# Patient Record
Sex: Male | Born: 1960 | Race: White | Hispanic: No | Marital: Married | Smoking: Former smoker
Health system: Southern US, Community
[De-identification: ages and names within clinical notes are randomized; demographics above are authoritative.]

## PROBLEM LIST (undated history)

## (undated) DIAGNOSIS — M1610 Unilateral primary osteoarthritis, unspecified hip: Secondary | ICD-10-CM

## (undated) DIAGNOSIS — R569 Unspecified convulsions: Secondary | ICD-10-CM

## (undated) DIAGNOSIS — F329 Major depressive disorder, single episode, unspecified: Secondary | ICD-10-CM

## (undated) DIAGNOSIS — I10 Essential (primary) hypertension: Secondary | ICD-10-CM

## (undated) DIAGNOSIS — F419 Anxiety disorder, unspecified: Secondary | ICD-10-CM

## (undated) DIAGNOSIS — F32A Depression, unspecified: Secondary | ICD-10-CM

## (undated) DIAGNOSIS — Z7141 Alcohol abuse counseling and surveillance of alcoholic: Secondary | ICD-10-CM

## (undated) DIAGNOSIS — M1990 Unspecified osteoarthritis, unspecified site: Secondary | ICD-10-CM

## (undated) DIAGNOSIS — M2550 Pain in unspecified joint: Secondary | ICD-10-CM

## (undated) DIAGNOSIS — K649 Unspecified hemorrhoids: Secondary | ICD-10-CM

## (undated) DIAGNOSIS — E785 Hyperlipidemia, unspecified: Secondary | ICD-10-CM

## (undated) DIAGNOSIS — K219 Gastro-esophageal reflux disease without esophagitis: Secondary | ICD-10-CM

## (undated) HISTORY — DX: Essential (primary) hypertension: I10

## (undated) HISTORY — DX: Depression, unspecified: F32.A

## (undated) HISTORY — DX: Unspecified osteoarthritis, unspecified site: M19.90

## (undated) HISTORY — PX: TONSILLECTOMY: SUR1361

## (undated) HISTORY — DX: Pain in unspecified joint: M25.50

## (undated) HISTORY — DX: Anxiety disorder, unspecified: F41.9

## (undated) HISTORY — DX: Alcohol abuse counseling and surveillance of alcoholic: Z71.41

## (undated) HISTORY — DX: Unilateral primary osteoarthritis, unspecified hip: M16.10

## (undated) HISTORY — DX: Hyperlipidemia, unspecified: E78.5

## (undated) HISTORY — PX: ARTHROSCOPIC REPAIR ACL: SUR80

## (undated) HISTORY — PX: FRACTURE SURGERY: SHX138

## (undated) HISTORY — DX: Major depressive disorder, single episode, unspecified: F32.9

---

## 2001-10-28 ENCOUNTER — Ambulatory Visit (HOSPITAL_COMMUNITY): Admission: RE | Admit: 2001-10-28 | Discharge: 2001-10-28 | Payer: Self-pay | Admitting: Infectious Diseases

## 2001-10-28 ENCOUNTER — Encounter: Payer: Self-pay | Admitting: Infectious Diseases

## 2005-01-27 ENCOUNTER — Encounter: Payer: Self-pay | Admitting: Family Medicine

## 2005-01-27 LAB — CONVERTED CEMR LAB

## 2005-08-13 ENCOUNTER — Ambulatory Visit: Payer: Self-pay | Admitting: Family Medicine

## 2005-08-27 ENCOUNTER — Ambulatory Visit: Payer: Self-pay | Admitting: Family Medicine

## 2005-09-24 ENCOUNTER — Ambulatory Visit: Payer: Self-pay | Admitting: Family Medicine

## 2005-12-22 ENCOUNTER — Ambulatory Visit: Payer: Self-pay | Admitting: Family Medicine

## 2006-04-06 DIAGNOSIS — Z6841 Body Mass Index (BMI) 40.0 and over, adult: Secondary | ICD-10-CM | POA: Insufficient documentation

## 2006-04-06 DIAGNOSIS — F1020 Alcohol dependence, uncomplicated: Secondary | ICD-10-CM | POA: Insufficient documentation

## 2006-04-06 DIAGNOSIS — E669 Obesity, unspecified: Secondary | ICD-10-CM

## 2006-04-06 DIAGNOSIS — E785 Hyperlipidemia, unspecified: Secondary | ICD-10-CM

## 2006-04-06 DIAGNOSIS — I10 Essential (primary) hypertension: Secondary | ICD-10-CM | POA: Insufficient documentation

## 2006-08-20 ENCOUNTER — Encounter: Payer: Self-pay | Admitting: Family Medicine

## 2006-11-29 ENCOUNTER — Ambulatory Visit: Payer: Self-pay | Admitting: Family Medicine

## 2007-05-03 ENCOUNTER — Encounter: Admission: RE | Admit: 2007-05-03 | Discharge: 2007-05-03 | Payer: Self-pay | Admitting: Family Medicine

## 2007-05-03 ENCOUNTER — Ambulatory Visit: Payer: Self-pay | Admitting: Family Medicine

## 2007-05-19 ENCOUNTER — Ambulatory Visit: Payer: Self-pay | Admitting: Family Medicine

## 2007-11-25 ENCOUNTER — Ambulatory Visit: Payer: Self-pay | Admitting: Family Medicine

## 2007-11-25 DIAGNOSIS — R55 Syncope and collapse: Secondary | ICD-10-CM

## 2007-11-28 ENCOUNTER — Encounter: Payer: Self-pay | Admitting: Family Medicine

## 2007-12-13 ENCOUNTER — Encounter: Payer: Self-pay | Admitting: Family Medicine

## 2007-12-27 ENCOUNTER — Telehealth: Payer: Self-pay | Admitting: Family Medicine

## 2007-12-27 ENCOUNTER — Encounter: Payer: Self-pay | Admitting: Family Medicine

## 2008-01-19 ENCOUNTER — Encounter: Payer: Self-pay | Admitting: Family Medicine

## 2008-01-23 ENCOUNTER — Telehealth: Payer: Self-pay | Admitting: Family Medicine

## 2008-02-03 ENCOUNTER — Ambulatory Visit: Payer: Self-pay | Admitting: Family Medicine

## 2008-02-03 DIAGNOSIS — M545 Low back pain: Secondary | ICD-10-CM

## 2008-08-30 ENCOUNTER — Telehealth: Payer: Self-pay | Admitting: Family Medicine

## 2008-09-10 ENCOUNTER — Ambulatory Visit: Payer: Self-pay | Admitting: Family Medicine

## 2008-11-30 ENCOUNTER — Telehealth (INDEPENDENT_AMBULATORY_CARE_PROVIDER_SITE_OTHER): Payer: Self-pay | Admitting: *Deleted

## 2009-01-07 ENCOUNTER — Telehealth: Payer: Self-pay | Admitting: Family Medicine

## 2009-04-23 ENCOUNTER — Ambulatory Visit: Payer: Self-pay | Admitting: Family Medicine

## 2009-09-23 ENCOUNTER — Ambulatory Visit: Payer: Self-pay | Admitting: Family Medicine

## 2009-09-23 DIAGNOSIS — K649 Unspecified hemorrhoids: Secondary | ICD-10-CM | POA: Insufficient documentation

## 2009-09-23 LAB — CONVERTED CEMR LAB
Hemoglobin: 13.7 g/dL
INR: 0.9
Prothrombin Time: 11.1 s

## 2010-05-06 ENCOUNTER — Encounter: Admission: RE | Admit: 2010-05-06 | Discharge: 2010-05-06 | Payer: Self-pay | Admitting: Family Medicine

## 2010-05-06 ENCOUNTER — Ambulatory Visit: Payer: Self-pay | Admitting: Family Medicine

## 2010-05-06 DIAGNOSIS — G4700 Insomnia, unspecified: Secondary | ICD-10-CM

## 2010-05-06 DIAGNOSIS — R599 Enlarged lymph nodes, unspecified: Secondary | ICD-10-CM | POA: Insufficient documentation

## 2010-05-07 LAB — CONVERTED CEMR LAB
ALT: 28 units/L (ref 0–53)
AST: 21 units/L (ref 0–37)
Albumin: 4.7 g/dL (ref 3.5–5.2)
Alkaline Phosphatase: 63 units/L (ref 39–117)
BUN: 20 mg/dL (ref 6–23)
Basophils Absolute: 0.1 10*3/uL (ref 0.0–0.1)
Basophils Relative: 1 % (ref 0–1)
CO2: 23 meq/L (ref 19–32)
Calcium: 9 mg/dL (ref 8.4–10.5)
Chloride: 101 meq/L (ref 96–112)
Cholesterol: 179 mg/dL (ref 0–200)
Creatinine, Ser: 1.04 mg/dL (ref 0.40–1.50)
Eosinophils Absolute: 0.2 10*3/uL (ref 0.0–0.7)
Eosinophils Relative: 3 % (ref 0–5)
Glucose, Bld: 89 mg/dL (ref 70–99)
HCT: 46.2 % (ref 39.0–52.0)
HDL: 48 mg/dL (ref >39)
Hemoglobin: 15.2 g/dL (ref 13.0–17.0)
LDL Cholesterol: 110 mg/dL — ABNORMAL HIGH (ref 0–99)
Lymphocytes Relative: 42 % (ref 12–46)
Lymphs Abs: 3.7 10*3/uL (ref 0.7–4.0)
MCHC: 32.9 g/dL (ref 30.0–36.0)
MCV: 88.8 fL (ref 78.0–100.0)
Monocytes Absolute: 0.6 10*3/uL (ref 0.1–1.0)
Monocytes Relative: 7 % (ref 3–12)
Neutro Abs: 4.3 10*3/uL (ref 1.7–7.7)
Neutrophils Relative %: 48 % (ref 43–77)
PSA: 0.24 ng/mL (ref <=4.00)
Platelets: 272 10*3/uL (ref 150–400)
Potassium: 4.5 meq/L (ref 3.5–5.3)
RBC: 5.2 M/uL (ref 4.22–5.81)
RDW: 13.5 % (ref 11.5–15.5)
Sodium: 135 meq/L (ref 135–145)
Total Bilirubin: 0.6 mg/dL (ref 0.3–1.2)
Total CHOL/HDL Ratio: 3.7
Total Protein: 7 g/dL (ref 6.0–8.3)
Triglycerides: 107 mg/dL (ref <150)
VLDL: 21 mg/dL (ref 0–40)
WBC: 8.9 10*3/uL (ref 4.0–10.5)

## 2010-06-27 ENCOUNTER — Ambulatory Visit
Admission: RE | Admit: 2010-06-27 | Discharge: 2010-06-27 | Payer: Self-pay | Source: Home / Self Care | Attending: Family Medicine | Admitting: Family Medicine

## 2010-06-27 DIAGNOSIS — J209 Acute bronchitis, unspecified: Secondary | ICD-10-CM

## 2010-06-27 DIAGNOSIS — J019 Acute sinusitis, unspecified: Secondary | ICD-10-CM

## 2010-07-11 ENCOUNTER — Ambulatory Visit
Admission: RE | Admit: 2010-07-11 | Discharge: 2010-07-11 | Payer: Self-pay | Source: Home / Self Care | Attending: Family Medicine | Admitting: Family Medicine

## 2010-07-11 ENCOUNTER — Encounter: Payer: Self-pay | Admitting: Family Medicine

## 2010-07-11 ENCOUNTER — Telehealth: Payer: Self-pay | Admitting: Family Medicine

## 2010-07-11 DIAGNOSIS — R05 Cough: Secondary | ICD-10-CM | POA: Insufficient documentation

## 2010-07-11 LAB — CONVERTED CEMR LAB
Basophils Absolute: 0.2 10*3/uL — ABNORMAL HIGH (ref 0.0–0.1)
Basophils Relative: 2 % — ABNORMAL HIGH (ref 0–1)
Eosinophils Absolute: 0 10*3/uL (ref 0.0–0.7)
Eosinophils Relative: 0 % (ref 0–5)
HCT: 46.1 % (ref 39.0–52.0)
Hemoglobin: 15.6 g/dL (ref 13.0–17.0)
Lymphocytes Relative: 29 % (ref 12–46)
Lymphs Abs: 3.3 10*3/uL (ref 0.7–4.0)
MCHC: 33.7 g/dL (ref 30.0–36.0)
MCV: 86.7 fL (ref 78.0–100.0)
Monocytes Absolute: 1.2 10*3/uL — ABNORMAL HIGH (ref 0.1–1.0)
Monocytes Relative: 10 % (ref 3–12)
Neutro Abs: 6.8 10*3/uL (ref 1.7–7.7)
Neutrophils Relative %: 59 % (ref 43–77)
Platelets: 278 10*3/uL (ref 150–400)
RBC: 5.32 M/uL (ref 4.22–5.81)
RDW: 12.5 % (ref 11.5–15.5)
WBC: 11.5 10*3/uL — ABNORMAL HIGH (ref 4.0–10.5)

## 2010-07-29 NOTE — Consult Note (Signed)
Summary: [facility]   Imported By: Lanelle Bal 10/24/2009 11:02:44  _____________________________________________________________________  External Attachment:    Type:   Image     Comment:   External Document

## 2010-07-29 NOTE — Assessment & Plan Note (Signed)
Summary: rectal bleeding   Vital Signs:  Patient profile:   50 year old male Height:      70.25 inches O2 Sat:      99 % on Room air Temp:     98.3 degrees F oral Pulse rate:   70 / minute BP sitting:   153 / 100  (left arm) Cuff size:   large  Vitals Entered By: Payton Spark CMA (September 23, 2009 11:22 AM)  O2 Flow:  Room air CC: Rectal bleeding this AM. No pain or other Sxs.    Primary Care Provider:  Seymour Bars D.O.  CC:  Rectal bleeding this AM. No pain or other Sxs. .  History of Present Illness: 50 yo WF presents for rectal bleeding that started this AM while at work.    He had a BM this AM.  He did not have any blood with wiping.  He is not on any blood thinner.  He has had hemorrhoids that have bled in the past.  He has some rectal discomfort.  Had not had any pain with defecation the past few days.  He did not take his lisinopril otday.  He has lost weight with increased exercise.  He is not feeling weak, lightheaded, CP or SOB.  He has never had a colonoscopy.  He quit drinking ETOH a few years ago.      Current Medications (verified): 1)  Lipitor 20 Mg Tabs (Atorvastatin Calcium) .... Take 1 Tablet By Mouth Once A Day 2)  Lisinopril 5 Mg  Tabs (Lisinopril) .Marland Kitchen.. 1 Tab By Mouth Daily 3)  Pepcid 20 Mg  Tabs (Famotidine) .... Take One Tablet By Mouth Once A Day 4)  Claritin-D 24 Hour 10-240 Mg Xr24h-Tab (Loratadine-Pseudoephedrine) .... Take 1 Tablet By Mouth Once A Day 5)  Lexapro 20 Mg Tabs (Escitalopram Oxalate) .Marland Kitchen.. 1 Tab By Mouth Daily  Allergies (verified): No Known Drug Allergies  Past History:  Past Medical History: Reviewed history from 04/23/2009 and no changes required. alcohol rehab 10-06; 10-08 HTN  Past Surgical History: Reviewed history from 08/20/2006 and no changes required. ACL repair, Left  PFTs normal  FEV1 85% predicted  Family History: Reviewed history from 08/20/2006 and no changes required. father healthy  MGM heart dz, 23s,  stroke, DM  mother high chol, HTN  sister depression  Social History: Reviewed history from 04/06/2006 and no changes required. Married to [place].  Has 4 kids- 19, 17, 13, 12.  Works for [employer] and [employer].  Quit smoking in 2005 after 15 pk years.  Trying to lose wt, started exercising again.  Quit drinking 10-06, attending AA meetings.  Review of Systems      See HPI  Physical Exam  General:  alert, well-developed, well-nourished, well-hydrated, and overweight-appearing.   Head:  normocephalic and atraumatic.   Eyes:  sclera non icteric Neck:  no masses.   Lungs:  non labored Rectal:  large external hemorrhoids with active red bleeding, lacerated.   Extremities:  no LE edema Skin:  no palor   Impression & Recommendations:  Problem # 1:  HEMORRHOIDS, WITH BLEEDING (ICD-455.8) Hgb: 13.7 INR:0.9  Send to [facility] today for actively bleeding hemorrhoids.  Pt instructed to go home and lay on his side until 3 pm appt.  He may need injection in the office but will probably  need surgery long term.  He is almost 49 and will be due for a screening colonosocpy also.  Hemodynamically stable.   Orders:  Fingerstick (36416) Protime INR (16109) Hgb (60454) Surgical Referral (Surgery)  Problem # 2:  HYPERTENSION, BENIGN SYSTEMIC (ICD-401.1) He will go home and take his Lisinopril today which he skipped this morning. His updated medication list for this problem includes:    Lisinopril 5 Mg Tabs (Lisinopril) .Marland Kitchen... 1 tab by mouth daily  BP today: 153/100 Prior BP: 129/91 (04/23/2009)  Complete Medication List: 1)  Lipitor 20 Mg Tabs (Atorvastatin calcium) .... Take 1 tablet by mouth once a day 2)  Lisinopril 5 Mg Tabs (Lisinopril) .Marland Kitchen.. 1 tab by mouth daily 3)  Pepcid 20 Mg Tabs (Famotidine) .... Take one tablet by mouth once a day 4)  Claritin-d 24 Hour 10-240 Mg Xr24h-tab (Loratadine-pseudoephedrine) .... Take 1 tablet by mouth once a day 5)  Lexapro 20 Mg Tabs (Escitalopram oxalate)  .Marland Kitchen.. 1 tab by mouth daily  Laboratory Results   Blood Tests     PT: 11.1 s   (Normal Range: 10.6-13.4)  INR: 0.9   (Normal Range: 0.88-1.12   Therap INR: 2.0-3.5)  CBC   HGB:  13.7 g/dL   (Normal Range: 09.8-11.9 in Males, 12.0-15.0 in Females)

## 2010-07-29 NOTE — Assessment & Plan Note (Signed)
Summary: HTN/ neck mass   Vital Signs:  Patient profile:   50 year old male Height:      70.25 inches Weight:      229 pounds BMI:     32.74 O2 Sat:      97 % on Room air Pulse rate:   66 / minute BP sitting:   116 / 81  (left arm) Cuff size:   large  Vitals Entered By: Payton Spark CMA (May 06, 2010 10:23 AM)  O2 Flow:  Room air CC: F/U HTN   Primary Care Carman Essick:  Seymour Bars D.O.  CC:  F/U HTN.  History of Present Illness: 50 yo WM presents for f/u HTN and depression.  He has stayed off the alcohol and is back on Lexapro after a relapse this year.  He is not snoring since he lost wt and did put off a sleep study earlier this year.  He is taking Tylenol PM a few nights / wk for sleep.    We are due for fasting labs today. He is on Lisinopril 5 mg/ day for HTN and doing well.  On Lipitor for high cholesterol and is doing well.    He has stopped going to AA but is doing a recovery group at church. He has lost some weight.  Eating better and exercising more.    Current Medications (verified): 1)  Lipitor 20 Mg Tabs (Atorvastatin Calcium) .... Take 1 Tablet By Mouth Once A Day 2)  Lisinopril 5 Mg  Tabs (Lisinopril) .Marland Kitchen.. 1 Tab By Mouth Daily 3)  Claritin-D 24 Hour 10-240 Mg Xr24h-Tab (Loratadine-Pseudoephedrine) .... Take 1 Tablet By Mouth Once A Day 4)  Lexapro 20 Mg Tabs (Escitalopram Oxalate) .Marland Kitchen.. 1 Tab By Mouth Daily  Allergies (verified): No Known Drug Allergies  Past History:  Past Medical History: alcohol rehab 10-06; 10-08 HTN high cholesterol depression  Past Surgical History: Reviewed history from 08/20/2006 and no changes required. ACL repair, Left  PFTs normal  FEV1 85% predicted  Family History: Reviewed history from 08/20/2006 and no changes required. father healthy  MGM heart dz, 48s, stroke, DM  mother high chol, HTN  sister depression  Social History: Reviewed history from 04/06/2006 and no changes required. Married to [place].   Has 4 kids- 19, 17, 13, 12.  Works for [employer] and [employer].  Quit smoking in 2005 after 15 pk years.  Trying to lose wt, started exercising again.  Quit drinking 10-06, attending AA meetings.  Review of Systems General:  Denies fatigue and sleep disorder; intentional wt loss. Eyes:  Denies blurring. CV:  Denies chest pain or discomfort, leg cramps with exertion, palpitations, shortness of breath with exertion, and swelling of feet. Neuro:  troube falling asleep. Psych:  Denies anxiety and depression.  Physical Exam  General:  alert, well-developed, well-nourished, well-hydrated, and overweight-appearing.   Head:  normocephalic and atraumatic.   Eyes:  pupils equal, pupils round, and pupils reactive to light.   Mouth:  pharynx pink and moist.   Neck:  prominent nontender but enlarged L>R submandibular glands, moveable and soft Lungs:  Normal respiratory effort, chest expands symmetrically. Lungs are clear to auscultation, no crackles or wheezes. Heart:  Normal rate and regular rhythm. S1 and S2 normal without gallop, murmur, click, rub or other extra sounds. Abdomen:  soft, non-tender, normal bowel sounds, no distention, no masses, no hepatomegaly, and no splenomegaly.   Extremities:  no LE edema Skin:  color normal.   Psych:  good eye contact, not anxious appearing, and not depressed appearing.     Impression & Recommendations:  Problem # 1:  HYPERTENSION, BENIGN SYSTEMIC (ICD-401.1) Assessment Improved Continue current meds.  Update fasting labs.  Cont to work on wt loss. His updated medication list for this problem includes:    Lisinopril 5 Mg Tabs (Lisinopril) .Marland Kitchen... 1 tab by mouth daily  BP today: 116/81 Prior BP: 153/100 (09/23/2009)  Problem # 2:  HYPERLIPIDEMIA (ICD-272.4) Doing well on Lipitor.  Due for FLP. His updated medication list for this problem includes:    Lipitor 20 Mg Tabs (Atorvastatin calcium) .Marland Kitchen... Take 1 tablet by mouth once a day  Problem # 3:   CERVICAL LYMPHADENOPATHY (ICD-785.6) Assessment: New L>R submandibular swelling x 2-3 mos w/o tenderness or infection.  Will CT today. Orders: T-CT Neck w/o cm (16109) T-CBC w/Diff ([id number])  Problem # 4:  INSOMNIA (ICD-780.52) Stop Tylenol PM, change to Hydroxyzine as needed.  Avoid Ambien/ benzo class due to hx of alcoholism.  Complete Medication List: 1)  Lipitor 20 Mg Tabs (Atorvastatin calcium) .... Take 1 tablet by mouth once a day 2)  Lisinopril 5 Mg Tabs (Lisinopril) .Marland Kitchen.. 1 tab by mouth daily 3)  Claritin-d 24 Hour 10-240 Mg Xr24h-tab (Loratadine-pseudoephedrine) .... Take 1 tablet by mouth once a day 4)  Lexapro 20 Mg Tabs (Escitalopram oxalate) .Marland Kitchen.. 1 tab by mouth daily 5)  Hydroxyzine Hcl 25 Mg Tabs (Hydroxyzine hcl) .Marland Kitchen.. 1-2 tabs by mouth at bedtime as needed sleep  Other Orders: T-PSA ([id number]) T-Comprehensive Metabolic Panel [phone]) T-Lipid Profile ([id number])  Patient Instructions: 1)  Fasting labs downstairs today. 2)  Will call you with results tomorrow. 3)  Stay on current meds. 4)  Add Hydroxyzine at bedtime for sleep instead of Tylenol PM. 5)  CT neck today. 6)  Will call you w/ results tomorrow. 7)  I will work on setting up your colonoscopy. 8)  Return for a PHYSICAL in 3 mos. Prescriptions: HYDROXYZINE HCL 25 MG TABS (HYDROXYZINE HCL) 1-2 tabs by mouth at bedtime as needed sleep  #60 x 1   Entered and Authorized by:   Seymour Bars DO   Signed by:   Seymour Bars DO on 05/06/2010   Method used:   Electronically to        [facility] # [phone]* (retail)       [address]       [place], [place]  [address]       Ph: [phone]       Fax: [phone]   RxID:   [id number]    Orders Added: 1)  T-PSA [phone] 2)  T-Comprehensive Metabolic Panel [[id number]] 3)  T-Lipid Profile [[id number]] 4)  T-CT Neck w/o cm [70490] 5)  T-CBC w/Diff [35573-22025] 6)  Est. Patient Level IV [42706]

## 2010-07-31 NOTE — Letter (Signed)
Summary: Out of Work  [facility]  [address], [address]   [place], [place] [address]   Phone: [phone]  Fax: [phone]    July 11, 2010   Employee:  Jackson Sherman    To Whom It May Concern:   For Medical reasons, please excuse the above named employee from work for the following dates:  Start:   Jan 13th -15th  End:   Jan 16th  If you need additional information, please feel free to contact our office.         Sincerely,    Seymour Bars DO

## 2010-07-31 NOTE — Progress Notes (Signed)
Summary: Still sick  Phone Note Call from Patient   Caller: Patient Summary of Call: Pt states he completed ABX and felt better for 2 days but now all symptoms have returned. Does Pt need OV for ABX?  Please advise. Initial call taken by: Payton Spark CMA,  July 11, 2010 9:33 AM  Follow-up for Phone Call        schedule OV this afternooon. Follow-up by: Seymour Bars DO,  July 11, 2010 9:38 AM     Appended Document: Still sick Apt scheduled

## 2010-07-31 NOTE — Assessment & Plan Note (Signed)
Summary: Sinsusit/bronchitis   Vital Signs:  Patient profile:   50 year old male Height:      70.25 inches Weight:      237 pounds Temp:     98.0 degrees F oral Pulse rate:   68 / minute BP sitting:   135 / 88  (right arm) Cuff size:   regular  Vitals Entered By: Avon Gully CMA, Duncan Dull) (June 27, 2010 1:00 PM) CC: sinus pressure ,cough sxs for 2 weeks   Primary Care Provider:  Seymour Bars D.O.  CC:  sinus pressure  and cough sxs for 2 weeks.  History of Present Illness: sinus pressure ,cough sxs for 2 weeks. Cough is worse at night. Keeping him awake. Thick dark green sputum.  Taking mucinex ane sinus meds and this does help during the daytime.  + HA, mild ST from cough.  Some mild pain mid shcest with cough. No fever. Some runny nose and some congestion.  + chills. Diarrhea for a couple of days but tha thas resolved.   Current Medications (verified): 1)  Lipitor 20 Mg Tabs (Atorvastatin Calcium) .... Take 1 Tablet By Mouth Once A Day 2)  Lisinopril 5 Mg  Tabs (Lisinopril) .Marland Kitchen.. 1 Tab By Mouth Daily 3)  Claritin-D 24 Hour 10-240 Mg Xr24h-Tab (Loratadine-Pseudoephedrine) .... Take 1 Tablet By Mouth Once A Day 4)  Lexapro 20 Mg Tabs (Escitalopram Oxalate) .Marland Kitchen.. 1 Tab By Mouth Daily 5)  Hydroxyzine Hcl 25 Mg Tabs (Hydroxyzine Hcl) .Marland Kitchen.. 1-2 Tabs By Mouth At Bedtime As Needed Sleep  Allergies (verified): No Known Drug Allergies  Comments:  Nurse/Medical Assistant: The patient's medications and allergies were reviewed with the patient and were updated in the Medication and Allergy Lists. Avon Gully CMA, Duncan Dull) (June 27, 2010 1:01 PM)  Physical Exam  General:  Well-developed,well-nourished,in no acute distress; alert,appropriate and cooperative throughout examination Head:  Normocephalic and atraumatic without obvious abnormalities. No apparent alopecia or balding. Eyes:  No corneal or conjunctival inflammation noted. EOMI. Perrla. Funduscopic exam benign,  without hemorrhages, exudates or papilledema. Vision grossly normal. Ears:  External ear exam shows no significant lesions or deformities.  Otoscopic examination reveals clear canals, tympanic membranes are intact bilaterally without bulging, retraction, inflammation or discharge. Hearing is grossly normal bilaterally. Nose:  External nasal examination shows no deformity or inflammation. Nasal mucosa are pink and moist without lesions or exudates. Mouth:  Oral mucosa and oropharynx without lesions or exudates.  Teeth in good repair. Neck:  Large soft symmetric mass on teh left side of the neck.  Lungs:  Normal respiratory effort, chest expands symmetrically. Lungs are clear to auscultation, no crackles or wheezes. Heart:  Normal rate and regular rhythm. S1 and S2 normal without gallop, murmur, click, rub or other extra sounds. Skin:  no rashes.   Cervical Nodes:  No lymphadenopathy noted Psych:  Cognition and judgment appear intact. Alert and cooperative with normal attention span and concentration. No apparent delusions, illusions, hallucinations   Impression & Recommendations:  Problem # 1:  SINUSITIS - ACUTE-NOS (ICD-461.9)  His updated medication list for this problem includes:    Claritin-d 24 Hour 10-240 Mg Xr24h-tab (Loratadine-pseudoephedrine) .Marland Kitchen... Take 1 tablet by mouth once a day    Amoxicillin 875 Mg Tabs (Amoxicillin) .Marland Kitchen... Take 1 tablet by mouth two times a day for 10 days.    Hydrocodone-homatropine 5-1.5 Mg/3ml Syrp (Hydrocodone-homatropine) .Marland KitchenMarland KitchenMarland KitchenMarland Kitchen 5ml by mouth at bedtime as needed cough  Problem # 2:  ACUTE BRONCHITIS (ICD-466.0)  His updated medication list for  this problem includes:    Claritin-d 24 Hour 10-240 Mg Xr24h-tab (Loratadine-pseudoephedrine) .Marland Kitchen... Take 1 tablet by mouth once a day    Amoxicillin 875 Mg Tabs (Amoxicillin) .Marland Kitchen... Take 1 tablet by mouth two times a day for 10 days.    Hydrocodone-homatropine 5-1.5 Mg/58ml Syrp (Hydrocodone-homatropine) .Marland KitchenMarland KitchenMarland KitchenMarland Kitchen 5ml by  mouth at bedtime as needed cough  Encouraged to push clear liquids, get enough rest, and take acetaminophen as needed. To be seen in 5-7 days if no improvement, sooner if worse.  Complete Medication List: 1)  Lipitor 20 Mg Tabs (Atorvastatin calcium) .... Take 1 tablet by mouth once a day 2)  Lisinopril 5 Mg Tabs (Lisinopril) .Marland Kitchen.. 1 tab by mouth daily 3)  Claritin-d 24 Hour 10-240 Mg Xr24h-tab (Loratadine-pseudoephedrine) .... Take 1 tablet by mouth once a day 4)  Lexapro 20 Mg Tabs (Escitalopram oxalate) .Marland Kitchen.. 1 tab by mouth daily 5)  Hydroxyzine Hcl 25 Mg Tabs (Hydroxyzine hcl) .Marland Kitchen.. 1-2 tabs by mouth at bedtime as needed sleep 6)  Amoxicillin 875 Mg Tabs (Amoxicillin) .... Take 1 tablet by mouth two times a day for 10 days. 7)  Hydrocodone-homatropine 5-1.5 Mg/70ml Syrp (Hydrocodone-homatropine) .... 5ml by mouth at bedtime as needed cough  Patient Instructions: 1)  Call if not better in one week.  Prescriptions: HYDROCODONE-HOMATROPINE 5-1.5 MG/5ML SYRP (HYDROCODONE-HOMATROPINE) 5ml by mouth at bedtime as needed cough  #162ml x 0   Entered and Authorized by:   Nani Gasser MD   Signed by:   Nani Gasser MD on 06/27/2010   Method used:   Printed then faxed to ...       [facility]  [address] # [phone]* (retail)       [phone]       [place], [place]  [address]       Ph: [phone]       Fax: [phone]   RxID:   [id number] AMOXICILLIN 875 MG TABS (AMOXICILLIN) Take 1 tablet by mouth two times a day for 10 days.  #20 x 0   Entered and Authorized by:   Nani Gasser MD   Signed by:   Nani Gasser MD on 06/27/2010   Method used:   Electronically to        [facility] # [phone]* (retail)       [address]       [place], [place]  [address]       Ph: [phone]       Fax: [phone]   RxID:   [id number]    Orders Added: 1)  Est. Patient Level III [70623]

## 2010-07-31 NOTE — Assessment & Plan Note (Signed)
Summary: bronchitis   Vital Signs:  Patient profile:   50 year old male Height:      70.25 inches Weight:      238 pounds BMI:     34.03 O2 Sat:      96 % on Room air Temp:     98.7 degrees F oral Pulse rate:   90 / minute BP sitting:   125 / 84  (left arm) Cuff size:   regular  Vitals Entered By: Payton Spark CMA (July 11, 2010 1:01 PM)  O2 Flow:  Room air CC: Bad cough and congestion   Primary Care Provider:  Seymour Bars D.O.  CC:  Bad cough and congestion.  History of Present Illness: 50 yo WM presents for a return of cough from a diagnosis of bronchitis  from 12-30.  He has felt worse for 48 hrs now with subjective fevers, chills.  His cough is mostly dry.  He has chest tightness and SOB.  He has some HA and head congestion.  He has sick contacts at work.  Had a flu shot this year.    He completed 10 days of Amoxicillin 3 days ago.  Since he ran out of Hycodan, his cough is keeping him up at night.  Denies hemoptysis or a hx of asthma or COPD.  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    Current Medications (verified): 1)  Lipitor 20 Mg Tabs (Atorvastatin Calcium) .... Take 1 Tablet By Mouth Once A Day 2)  Lisinopril 5 Mg  Tabs (Lisinopril) .Marland Kitchen.. 1 Tab By Mouth Daily 3)  Claritin-D 24 Hour 10-240 Mg Xr24h-Tab (Loratadine-Pseudoephedrine) .... Take 1 Tablet By Mouth Once A Day 4)  Lexapro 20 Mg Tabs (Escitalopram Oxalate) .Marland Kitchen.. 1 Tab By Mouth Daily 5)  Hydroxyzine Hcl 25 Mg Tabs (Hydroxyzine Hcl) .Marland Kitchen.. 1-2 Tabs By Mouth At Bedtime As Needed Sleep  Allergies (verified): No Known Drug Allergies  Past History:  Past Medical History: Reviewed history from 05/06/2010 and no changes required. alcohol rehab 10-06; 10-08 HTN high cholesterol depression  Past Surgical History: Reviewed history from 08/20/2006 and no changes required. ACL repair, Left  PFTs normal  FEV1 85% predicted  Social History: Reviewed history from 04/06/2006 and no changes  required. Married to [place].  Has 4 kids- 19, 17, 13, 12.  Works for [employer] and [employer].  Quit smoking in 2005 after 15 pk years.  Trying to lose wt, started exercising again.  Quit drinking 10-06, attending AA meetings.  Review of Systems      See HPI  Physical Exam  General:  alert, well-developed, well-nourished, and well-hydrated.   Head:  normocephalic and atraumatic.  sinuses NTTP Eyes:  conjunctiva clear, eyes slightly watery Ears:  EACs patent; TMs translucent and gray with good cone of light and bony landmarks.  Nose:  scant nasal congestion Mouth:  pharynx pink and moist.   Neck:  no masses.   Chest Wall:  no tenderness.   Lungs:  Normal respiratory effort, chest expands symmetrically. Lungs are clear to auscultation, no crackles ; dry hacking cough with end exp wheeze Heart:  normal rate, regular rhythm, and no murmur.   Skin:  color normal.   Cervical Nodes:  No lymphadenopathy noted   Impression & Recommendations:  Problem # 1:  ACUTE BRONCHITIS (ICD-466.0) Improved for a short period of time after use of Amox x 10 days but then cough, constitutional symptoms returned, now with a dry hacking cough and  wheezing.  Will get a CBC and if WBC elevated, add a different abx.  RFd cough syrup for nighttime and can use Mucinex DM during the day.  Added ProAir inhaler 2 puffs 4 x a day for the next wk.  If cough not resolved in 10 days, pls call.  Next step is PFTs. The following medications were removed from the medication list:    Amoxicillin 875 Mg Tabs (Amoxicillin) .Marland Kitchen... Take 1 tablet by mouth two times a day for 10 days.    Hydrocodone-homatropine 5-1.5 Mg/51ml Syrp (Hydrocodone-homatropine) .Marland KitchenMarland KitchenMarland KitchenMarland Kitchen 5ml by mouth at bedtime as needed cough His updated medication list for this problem includes:    Claritin-d 24 Hour 10-240 Mg Xr24h-tab (Loratadine-pseudoephedrine) .Marland Kitchen... Take 1 tablet by mouth once a day    Avelox 400 Mg Tabs (Moxifloxacin hcl) .Marland Kitchen... 1 tab by mouth daily x 7  days    Hydrocodone-homatropine 5-1.5 Mg/56ml Syrp (Hydrocodone-homatropine) .Marland KitchenMarland KitchenMarland KitchenMarland Kitchen 5 ml by mouth at bedtime as needed cough  Complete Medication List: 1)  Lipitor 20 Mg Tabs (Atorvastatin calcium) .... Take 1 tablet by mouth once a day 2)  Lisinopril 5 Mg Tabs (Lisinopril) .Marland Kitchen.. 1 tab by mouth daily 3)  Claritin-d 24 Hour 10-240 Mg Xr24h-tab (Loratadine-pseudoephedrine) .... Take 1 tablet by mouth once a day 4)  Lexapro 20 Mg Tabs (Escitalopram oxalate) .Marland Kitchen.. 1 tab by mouth daily 5)  Hydroxyzine Hcl 25 Mg Tabs (Hydroxyzine hcl) .Marland Kitchen.. 1-2 tabs by mouth at bedtime as needed sleep 6)  Medrol (pak) 4 Mg Tabs (Methylprednisolone) .... Take as directed 7)  Avelox 400 Mg Tabs (Moxifloxacin hcl) .Marland Kitchen.. 1 tab by mouth daily x 7 days 8)  Hydrocodone-homatropine 5-1.5 Mg/12ml Syrp (Hydrocodone-homatropine) .... 5 ml by mouth at bedtime as needed cough  Other Orders: T-CBC w/Diff ([id number])  Patient Instructions: 1)  STart Medrol Dose Pack today. 2)  Use ProAir inhaler 2 puffs 4 x a day for the next 7 days, then move to as needed for shortness of breathe with cough. 3)  Use Mucinex DM during the day and Hycodan at night for cough. 4)  CBC today. 5)  Fill RX antibiotic only if given the go - ahead tomorrow morning. Prescriptions: HYDROCODONE-HOMATROPINE 5-1.5 MG/5ML SYRP (HYDROCODONE-HOMATROPINE) 5 ml by mouth at bedtime as needed cough  #100 ml x 0   Entered and Authorized by:   Seymour Bars DO   Signed by:   Seymour Bars DO on 07/11/2010   Method used:   Printed then faxed to ...       [facility]  [address] # [phone]* (retail)       [phone]       [place], [place]  [address]       Ph: [phone]       Fax: [phone]   RxID:   [id number] AVELOX 400 MG TABS (MOXIFLOXACIN HCL) 1 tab by mouth daily x 7 days  #7 tabs x 0   Entered and Authorized by:   Seymour Bars DO   Signed by:   Seymour Bars DO on 07/11/2010   Method used:   Printed then faxed to ...       [facility]  [address] # [phone]* (retail)       [phone]       [place], [place]  [address]       Ph: [phone]  Fax: [phone]   RxID:   [id number] MEDROL (PAK) 4 MG TABS (METHYLPREDNISOLONE) take as directed  #1 x 0   Entered and Authorized by:   Seymour Bars DO   Signed by:   Seymour Bars DO on 07/11/2010   Method used:   Electronically to        [facility] # [phone]* (retail)       [address]       [place], [place]  [address]       Ph: [phone]       Fax: [phone]   RxID:   [id number]    Orders Added: 1)  T-CBC w/Diff [[id number]] 2)  Est. Patient Level III [09323]

## 2010-11-04 ENCOUNTER — Telehealth: Payer: Self-pay | Admitting: Family Medicine

## 2010-11-04 NOTE — Telephone Encounter (Signed)
Called pt back and moved up his B/P appt to Wednesday @ 1:00pm with [name]. Jackson Newcomer, LPN Domingo Dimes

## 2010-11-04 NOTE — Telephone Encounter (Signed)
Pt called and his B/P is up today 160/110 (sitting).  On Lisinopril 5 mg PO daily and ended up taking 2 of the 5 mg total to try to get the B/P down.  Now B/P  sitting 150/99.  No office appt for B/P since 03-2010 at which time he was given #90/1 refill.  Plan:  Scheduled pt an office visit for Friday 11-07-10 @ 09:00am.            Routed to [name] to see if we need to do anything else between now and Friday?

## 2010-11-05 ENCOUNTER — Encounter: Payer: Self-pay | Admitting: Family Medicine

## 2010-11-05 ENCOUNTER — Ambulatory Visit (INDEPENDENT_AMBULATORY_CARE_PROVIDER_SITE_OTHER): Payer: Commercial Indemnity | Admitting: Family Medicine

## 2010-11-05 VITALS — BP 129/91 | HR 75 | Ht 70.0 in | Wt 246.0 lb

## 2010-11-05 DIAGNOSIS — I10 Essential (primary) hypertension: Secondary | ICD-10-CM

## 2010-11-05 MED ORDER — LISINOPRIL 20 MG PO TABS: 20.0000 mg | ORAL_TABLET | Freq: Every day | ORAL | Status: DC

## 2010-11-05 NOTE — Progress Notes (Signed)
  Subjective:    Patient ID: Jackson Sherman, male    DOB: [birth year], 50 y.o.   MRN: [id number]  HPI  50 yo WM presents for elevated blood pressure.  He had a HA for 2 days while at work.  He had a HA yesterday and his BP was 160/110.  He has gained weight and has not been exercising.  He is taking Lexapro for his mood but has not had much energy.  He had labs in November.  No chest pains, SOB, heart palpitations, problems voiding or edema.  He has been compliant with his Lisinopril daily.  BP 129/91  Pulse 75  Ht 5\' 10"  (1.778 m)  Wt 246 lb (111.585 kg)  BMI 35.30 kg/m2  SpO2 97%   Review of Systems as per HPI    Objective:   Physical Exam  Constitutional: He appears well-developed and well-nourished. No distress.       obese  Eyes: Pupils are equal, round, and reactive to light.  Neck: Neck supple. No thyromegaly present.  Cardiovascular: Normal rate, regular rhythm and normal heart sounds.   No murmur heard. Pulmonary/Chest: Effort normal and breath sounds normal. No respiratory distress. He has no wheezes.  Musculoskeletal: He exhibits no edema.  Skin: Skin is warm and dry.  Psychiatric: He has a normal mood and affect.          Assessment & Plan:

## 2010-11-05 NOTE — Assessment & Plan Note (Signed)
BP running high with HAs.  Will go up on his Lisinopril from 5--> 20 mg once daily.  New RX sent to pharmacy.  Rise is likely due to weight gain.  DASH diet info given with plan to also work on diet and exercise.  Call if any problems.  Monitor BPs at home.  RTC for f/u with BMP in 4 wks.  Will need to continue to persue a sleep study.

## 2010-11-05 NOTE — Patient Instructions (Signed)
Change Lisinopril to 20 mg tab once daily.  Work on [employer] + exercise 30+ min 4-5 days/ wk.    Return for f/u BP/ WT in 4 wks.

## 2010-11-07 ENCOUNTER — Ambulatory Visit: Payer: Self-pay | Admitting: Family Medicine

## 2010-11-30 ENCOUNTER — Encounter: Payer: Self-pay | Admitting: Family Medicine

## 2010-12-01 ENCOUNTER — Ambulatory Visit (INDEPENDENT_AMBULATORY_CARE_PROVIDER_SITE_OTHER): Payer: Commercial Indemnity | Admitting: Family Medicine

## 2010-12-01 NOTE — Progress Notes (Signed)
  Subjective:    Patient ID: Jackson Sherman, male    DOB: 1961/05/14, 50 y.o.   MRN: [id number]  HPI  Did not show.   Review of Systems     Objective:   Physical Exam        Assessment & Plan:

## 2010-12-05 ENCOUNTER — Other Ambulatory Visit: Payer: Self-pay | Admitting: Family Medicine

## 2011-01-09 ENCOUNTER — Other Ambulatory Visit: Payer: Self-pay | Admitting: Family Medicine

## 2011-01-12 NOTE — Telephone Encounter (Signed)
Pt needs fasting lipid profile panel since last one done 05-06-10.  Have pt call the office to talk with nurse to get scheduled.

## 2011-02-24 ENCOUNTER — Other Ambulatory Visit: Payer: Self-pay | Admitting: Family Medicine

## 2011-03-11 ENCOUNTER — Encounter: Payer: Self-pay | Admitting: Emergency Medicine

## 2011-03-11 ENCOUNTER — Inpatient Hospital Stay (INDEPENDENT_AMBULATORY_CARE_PROVIDER_SITE_OTHER)
Admission: RE | Admit: 2011-03-11 | Discharge: 2011-03-11 | Disposition: A | Discharge status: Home / Self Care | Payer: Commercial Indemnity | Source: Ambulatory Visit | Attending: Emergency Medicine | Admitting: Emergency Medicine

## 2011-03-11 DIAGNOSIS — K047 Periapical abscess without sinus: Secondary | ICD-10-CM | POA: Insufficient documentation

## 2011-03-11 DIAGNOSIS — J019 Acute sinusitis, unspecified: Secondary | ICD-10-CM

## 2011-03-24 ENCOUNTER — Encounter: Payer: Commercial Indemnity | Admitting: Family Medicine

## 2011-05-29 ENCOUNTER — Encounter: Payer: Self-pay | Admitting: Family Medicine

## 2011-06-01 NOTE — Progress Notes (Signed)
Summary: Sinus infection/cold   Vital Signs:  Patient Profile:   50 Years Old Male CC:      dry cough, fever, sinus drainage/pressure x 1 week Height:     70.25 inches (178.44 cm) Weight:      240 pounds O2 Sat:      98 % O2 treatment:    Room Air Temp:     98.0 degrees F oral Pulse rate:   71 / minute Resp:     16 per minute BP sitting:   123 / 81  (left arm) Cuff size:   large  Vitals Entered By: Lajean Saver RN (March 11, 2011 10:09 AM)                  Updated Prior Medication List: LIPITOR 20 MG TABS (ATORVASTATIN CALCIUM) Take 1 tablet by mouth once a day LISINOPRIL 5 MG  TABS (LISINOPRIL) 1 tab by mouth daily  Current Allergies: No known allergies History of Present Illness Chief Complaint: dry cough, fever, sinus drainage/pressure x 1 week History of Present Illness: 50 Years Old Male complains of onset of cold symptoms for a few days.  Abyan has been using Mucinex which is helping a little bit. + sore throat improving + cough No pleuritic pain No wheezing + nasal congestion + post-nasal drainage No sinus pain/pressure No chest congestion No itchy/red eyes No earache No hemoptysis No SOB No chills/sweats + fever He also has a dental infection over the last few days and thinks this is where the fever is coming from.  He reports that a filling came out it is swollen. No nausea No vomiting No abdominal pain No diarrhea No skin rashes No fatigue No myalgias No headache    REVIEW OF SYSTEMS Constitutional Symptoms       Complains of fever.     Denies chills, night sweats, weight loss, weight gain, and fatigue.  Eyes       Denies change in vision, eye pain, eye discharge, glasses, contact lenses, and eye surgery. Ear/Nose/Throat/Mouth       Complains of frequent runny nose and sinus problems.      Denies hearing loss/aids, change in hearing, ear pain, ear discharge, dizziness, frequent nose bleeds, sore throat, hoarseness, and tooth pain or  bleeding.      Comments: scratch throat Respiratory       Complains of dry cough.      Denies productive cough, wheezing, shortness of breath, asthma, bronchitis, and emphysema/COPD.  Cardiovascular       Denies murmurs, chest pain, and tires easily with exhertion.    Gastrointestinal       Denies stomach pain, nausea/vomiting, diarrhea, constipation, blood in bowel movements, and indigestion. Genitourniary       Denies painful urination, kidney stones, and loss of urinary control. Neurological       Denies paralysis, seizures, and fainting/blackouts. Musculoskeletal       Denies muscle pain, joint pain, joint stiffness, decreased range of motion, redness, swelling, muscle weakness, and gout.  Skin       Denies bruising, unusual mles/lumps or sores, and hair/skin or nail changes.  Psych       Denies mood changes, temper/anger issues, anxiety/stress, speech problems, depression, and sleep problems. Other Comments: Patient c/o symptoms x 1 week. Temp of 100.2 this AM, taken tylenol and mucines congestion. Also c/o reent filling coming out, left side of jaw swelling   Past History:  Past Medical History: Reviewed history from 05/06/2010 and  no changes required. alcohol rehab 10-06; 10-08 HTN high cholesterol depression  Past Surgical History: Reviewed history from 08/20/2006 and no changes required. ACL repair, Left  PFTs normal  FEV1 85% predicted  Family History: Reviewed history from 08/20/2006 and no changes required. father healthy  MGM heart dz, 99s, stroke, DM  mother high chol, HTN  sister depression  Social History: Reviewed history from 04/06/2006 and no changes required. Married to [place].  Has 4 kids- 19, 17, 13, 12.  Works for [employer] and [employer].  Quit smoking in 2005 after 15 pk years.  Trying to lose wt, started exercising again.  Quit drinking 10-06, attending AA meetings. Physical Exam General appearance: well developed, well nourished, no acute  distress Ears: normal, no lesions or deformities Nasal: mucosa pink, nonedematous, no septal deviation, turbinates normal Oral/Pharynx: tongue normal, posterior pharynx without erythema or exudate Neck: L jaw with swelling, multiple filling/caps in teeth, mild tenderness Chest/Lungs: no rales, wheezes, or rhonchi bilateral, breath sounds equal without effort Heart: regular rate and  rhythm, no murmur MSE: oriented to time, place, and person Assessment New Problems: ABSCESS, TOOTH (ICD-522.5)   Plan New Medications/Changes: HYDROCODONE-HOMATROPINE 5-1.5 MG/5ML SYRP (HYDROCODONE-HOMATROPINE) 5cc at bedtime as needed for cough  #4oz x 0, 03/11/2011, Hoyt Koch MD AUGMENTIN [phone] MG TABS (AMOXICILLIN-POT CLAVULANATE) 1 by mouth two times a day for 8 days  #16 x 0, 03/11/2011, Hoyt Koch MD  New Orders: New Patient Level III [phone] Planning Comments:   1)  Take the prescribed antibiotic as instructed.  ABX mostly for dental infection, but if with sinusitis, should help that.  If worsening, can consider adding prednisone vs adding Avelox which helped him last year with brochitis. 2)  Use nasal saline solution (over the counter) at least 3 times a day. 3)  Use over the counter decongestants like Zyrtec-D every 12 hours as needed to help with congestion. 4)  Can take tylenol every 6 hours or motrin every 8 hours for pain or fever. 5)  Follow up with your primary doctor  if no improvement in 5-7 days, sooner if increasing pain, fever, or new symptoms.    The patient and/or caregiver has been counseled thoroughly with regard to medications prescribed including dosage, schedule, interactions, rationale for use, and possible side effects and they verbalize understanding.  Diagnoses and expected course of recovery discussed and will return if not improved as expected or if the condition worsens. Patient and/or caregiver verbalized understanding.   Prescriptions: HYDROCODONE-HOMATROPINE 5-1.5 MG/5ML SYRP (HYDROCODONE-HOMATROPINE) 5cc at bedtime as needed for cough  #4oz x 0   Entered and Authorized by:   Hoyt Koch MD   Signed by:   Hoyt Koch MD on 03/11/2011   Method used:   Print then Give to Patient   RxID:   [id number] AUGMENTIN 875-125 MG TABS (AMOXICILLIN-POT CLAVULANATE) 1 by mouth two times a day for 8 days  #16 x 0   Entered and Authorized by:   Hoyt Koch MD   Signed by:   Hoyt Koch MD on 03/11/2011   Method used:   Print then Give to Patient   RxID:   [id number]   Orders Added: 1)  New Patient Level III [40347]

## 2011-06-03 ENCOUNTER — Encounter: Payer: Self-pay | Admitting: Family Medicine

## 2011-06-03 ENCOUNTER — Ambulatory Visit (INDEPENDENT_AMBULATORY_CARE_PROVIDER_SITE_OTHER): Payer: Commercial Indemnity | Admitting: Family Medicine

## 2011-06-03 VITALS — BP 127/89 | HR 72 | Ht 70.0 in | Wt 248.0 lb

## 2011-06-03 DIAGNOSIS — Z0000 Encounter for general adult medical examination without abnormal findings: Secondary | ICD-10-CM

## 2011-06-03 MED ORDER — ESCITALOPRAM OXALATE 20 MG PO TABS: 20.0000 mg | ORAL_TABLET | Freq: Every day | ORAL | Status: DC

## 2011-06-03 MED ORDER — ATORVASTATIN CALCIUM 20 MG PO TABS: 20.0000 mg | ORAL_TABLET | Freq: Every day | ORAL | Status: DC

## 2011-06-03 MED ORDER — QUETIAPINE FUMARATE 50 MG PO TABS: 50.0000 mg | ORAL_TABLET | Freq: Every day | ORAL | Status: DC

## 2011-06-03 MED ORDER — LISINOPRIL 20 MG PO TABS: 20.0000 mg | ORAL_TABLET | Freq: Every day | ORAL | Status: DC

## 2011-06-03 NOTE — Progress Notes (Signed)
Subjective:    Patient ID: Jackson Sherman, male    DOB: 08-06-1960, 50 y.o.   MRN: [id number]  HPI  Here for CPE. Had bloodwork drawn at work. Forgot to bring copy. He will fax to [place].   Review of Systems Comprehensive ROS is neg.       BP 127/89  Pulse 72  Ht 5\' 10"  (1.778 m)  Wt 248 lb (112.492 kg)  BMI 35.58 kg/m2  SpO2 96%    No Known Allergies  Past Medical History  Diagnosis Date  . 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 Hypertension   . Hyperlipidemia   . Depression     Past Surgical History  Procedure Date  . Arthroscopic repair acl     left  . Tonsillectomy     Age 43     History   Social History  . Marital Status: Married    Spouse Name: N/A    Number of Children: 4  . Years of Education: N/A   Occupational History  .  [employer]   Social History Main Topics  . Smoking status: Former Smoker -- 15 years    Types: Cigarettes    Quit date: 06/30/2003  . Smokeless tobacco: Not on file  . Alcohol Use: No     Quit 03-2005, attending AA meetings   . Drug Use: Not on file  . Sexually Active: Yes -- Male partner(s)     married, 4 kids, works for [employer], trying to lose wt., started exercising program   Other Topics Concern  . Not on file   Social History Narrative   No regular exercise.  3-4 cups per day of caffeine.      Family History  Problem Relation Age of Onset  . Heart disease Maternal Grandmother 50  . Stroke Maternal Grandmother   . Diabetes Maternal Grandmother   . Hyperlipidemia Mother   . Hypertension Mother   . Depression Sister   . Breast cancer Mother 73    Objective:   Physical Exam  Constitutional: He is oriented to person, place, and time. He appears well-developed and well-nourished.  HENT:  Head: Normocephalic and atraumatic.  Right Ear: External ear normal.  Left Ear: External ear normal.  Nose: Nose normal.  Mouth/Throat: Oropharynx is clear and moist.  Eyes: Conjunctivae and  EOM are normal. Pupils are equal, round, and reactive to light.  Neck: Normal range of motion. Neck supple. No thyromegaly present.  Cardiovascular: Normal rate, regular rhythm, normal heart sounds and intact distal pulses.   Pulmonary/Chest: Effort normal and breath sounds normal.  Abdominal: Soft. Bowel sounds are normal. He exhibits no distension and no mass. There is no tenderness. There is no rebound and no guarding.  Genitourinary: Rectum normal. Guaiac negative stool.       Mildly enlarged prostate. No nodules.  External hemorrhoids.   Musculoskeletal: Normal range of motion.  Lymphadenopathy:    He has no cervical adenopathy.  Neurological: He is alert and oriented to person, place, and time. He has normal reflexes.  Skin: Skin is warm and dry.  Psychiatric: He has a normal mood and affect. His behavior is normal. Judgment and thought content normal.          Assessment & Plan:  CPE- doing well. He will fax labs to our office.  Reminded him to get colonoscopy Start a regular exercise program and make sure you are eating a healthy diet Try to eat 4  servings of dairy a day or take a calcium supplement (500mg  twice a day). Your vaccines are up to date.

## 2011-06-03 NOTE — Patient Instructions (Addendum)
Remember to get your colonoscopy.  Start a regular exercise program and make sure you are eating a healthy diet Try to eat 4 servings of dairy a day or take a calcium supplement (500mg  twice a day). Your vaccines are up to date.

## 2011-07-23 ENCOUNTER — Ambulatory Visit (INDEPENDENT_AMBULATORY_CARE_PROVIDER_SITE_OTHER): Payer: Commercial Indemnity | Admitting: Family Medicine

## 2011-07-23 ENCOUNTER — Encounter: Payer: Self-pay | Admitting: Family Medicine

## 2011-07-23 VITALS — BP 122/84 | HR 60 | Temp 98.1°F | Ht 71.0 in | Wt 251.0 lb

## 2011-07-23 DIAGNOSIS — Z2821 Immunization not carried out because of patient refusal: Secondary | ICD-10-CM

## 2011-07-23 DIAGNOSIS — J019 Acute sinusitis, unspecified: Secondary | ICD-10-CM

## 2011-07-23 DIAGNOSIS — I10 Essential (primary) hypertension: Secondary | ICD-10-CM

## 2011-07-23 DIAGNOSIS — A0811 Acute gastroenteropathy due to Norwalk agent: Secondary | ICD-10-CM

## 2011-07-23 MED ORDER — FEXOFENADINE-PSEUDOEPHED ER 180-240 MG PO TB24: 1.0000 | ORAL_TABLET | Freq: Every day | ORAL | Status: DC

## 2011-07-23 MED ORDER — HYDROCOD POLST-CHLORPHEN POLST 10-8 MG/5ML PO LQCR: 5.0000 mL | Freq: Two times a day (BID) | ORAL | Status: DC | PRN

## 2011-07-23 MED ORDER — BISMUTH SUBSALICYLATE 262 MG/15ML PO SUSP: 15.0000 mL | Freq: Three times a day (TID) | ORAL | Status: AC

## 2011-07-23 MED ORDER — AMOXICILLIN-POT CLAVULANATE 875-125 MG PO TABS: 1.0000 | ORAL_TABLET | Freq: Two times a day (BID) | ORAL | Status: AC

## 2011-07-23 MED ORDER — RANITIDINE HCL 150 MG PO CAPS: 150.0000 mg | ORAL_CAPSULE | Freq: Three times a day (TID) | ORAL | Status: DC

## 2011-07-23 NOTE — Progress Notes (Signed)
Subjective:     Patient ID: Jackson Sherman, male   DOB: 24-Jul-1960, 51 y.o.   MRN: [id number]  Diarrhea  This is a new problem. The current episode started yesterday. The problem has been gradually improving. The patient states that diarrhea does not awaken him from sleep. Associated symptoms include chills, coughing, headaches and myalgias.  Cough This is a new problem. The current episode started in the past 7 days (Cough started on Monday). The problem has been gradually worsening. The cough is non-productive. Associated symptoms include chills, headaches, myalgias, nasal congestion, postnasal drip and rhinorrhea. Treatments tried: cough drops and nyquill. The treatment provided no relief. There is no history of asthma, bronchiectasis, bronchitis, COPD, environmental allergies or pneumonia.  Immunization reviewed   Review of Systems  Constitutional: Positive for chills.  HENT: Positive for rhinorrhea and postnasal drip.   Respiratory: Positive for cough.   Gastrointestinal: Positive for diarrhea.  Musculoskeletal: Positive for myalgias.       Both hips hurt.  Neurological: Positive for headaches.  Hematological: Negative for environmental allergies.      BP 122/84  Pulse 60  Temp(Src) 98.1 F (36.7 C) (Oral)  Ht 5\' 11"  (1.803 m)  Wt 251 lb (113.853 kg)  BMI 35.01 kg/m2  SpO2 96% Objective:   Physical Exam  Nursing note and vitals reviewed. Constitutional: He is oriented to person, place, and time. He appears well-developed and well-nourished. No distress.  HENT:  Head: Normocephalic and atraumatic.  Right Ear: Tympanic membrane, external ear and ear canal normal.  Left Ear: Tympanic membrane, external ear and ear canal normal.  Nose: Mucosal edema and rhinorrhea present. Right sinus exhibits maxillary sinus tenderness. Left sinus exhibits maxillary sinus tenderness.  Mouth/Throat: Oropharynx is clear and moist. No oral lesions. No oropharyngeal exudate.  Eyes: Right eye  exhibits no discharge. Left eye exhibits no discharge. No scleral icterus.  Neck: Neck supple.  Cardiovascular: Normal rate, regular rhythm and normal heart sounds.   Pulmonary/Chest: Effort normal and breath sounds normal. He has no wheezes. He has no rales.  Lymphadenopathy:    He has cervical adenopathy.  Neurological: He is alert and oriented to person, place, and time.  Skin: Skin is warm and dry.       Assessment:     Sinusitis Gastrointestinal virus    Plan:     #1 augmentin, allegra D,tussionex #2 pepto and zantac  #3 work note until 01/27/2013return end of April #4 Patient declines at this time flu and tetanus administration

## 2011-07-23 NOTE — Patient Instructions (Signed)
Sinusitis Sinuses are air pockets within the bones of your face. The growth of bacteria within a sinus leads to infection. The infection prevents the sinuses from draining. This infection is called sinusitis. SYMPTOMS  There will be different areas of pain depending on which sinuses have become infected.  The maxillary sinuses often produce pain beneath the eyes.   Frontal sinusitis may cause pain in the middle of the forehead and above the eyes.  Other problems (symptoms) include:  Toothaches.   Colored, pus-like (purulent) drainage from the nose.   Swelling, warmth, and tenderness over the sinus areas may be signs of infection.  TREATMENT  Sinusitis is most often determined by an exam.X-rays may be taken. If x-rays have been taken, make sure you obtain your results or find out how you are to obtain them. Your caregiver may give you medications (antibiotics). These are medications that will help kill the bacteria causing the infection. You may also be given a medication (decongestant) that helps to reduce sinus swelling.  HOME CARE INSTRUCTIONS   Only take over-the-counter or prescription medicines for pain, discomfort, or fever as directed by your caregiver.   Drink extra fluids. Fluids help thin the mucus so your sinuses can drain more easily.   Applying either moist heat or ice packs to the sinus areas may help relieve discomfort.   Use saline nasal sprays to help moisten your sinuses. The sprays can be found at your local drugstore.  SEEK IMMEDIATE MEDICAL CARE IF:  You have a fever.   You have increasing pain, severe headaches, or toothache.   You have nausea, vomiting, or drowsiness.   You develop unusual swelling around the face or trouble seeing.  MAKE SURE YOU:   Understand these instructions.   Will watch your condition.   Will get help right away if you are not doing well or get worse.  Document Released: 06/15/2005 Document Revised: 02/25/2011 Document Reviewed:  01/12/2007 [facility] Patient Information 2012 [place], [place].Norovirus Infection Norovirus illness is caused by a viral infection. The term norovirus refers to a group of viruses. Any of those viruses can cause norovirus illness. This illness is often referred to by other names such as viral gastroenteritis, stomach flu, and food poisoning. Anyone can get a norovirus infection. People can have the illness multiple times during their lifetime. CAUSES  Norovirus is found in the stool or vomit of infected people. It is easily spread from person to person (contagious). People with norovirus are contagious from the moment they begin feeling ill. They may remain contagious for as long as 3 days to 2 weeks after recovery. People can become infected with the virus in several ways. This includes:  Eating food or drinking liquids that are contaminated with norovirus.   Touching surfaces or objects contaminated with norovirus, and then placing your hand in your mouth.   Having direct contact with a person who is infected and shows symptoms. This may occur while caring for someone with illness or while sharing foods or eating utensils with someone who is ill.  SYMPTOMS  Symptoms usually begin 1 to 2 days after ingestion of the virus. Symptoms may include:  Nausea.   Vomiting.   Diarrhea.   Stomach cramps.   Low-grade fever.   Chills.   Headache.   Muscle aches.   Tiredness.  Most people with norovirus illness get better within 1 to 2 days. Some people become dehydrated because they cannot drink enough liquids to replace those lost from vomiting and  diarrhea. This is especially true for young children, the elderly, and others who are unable to care for themselves. DIAGNOSIS  Diagnosis is based on your symptoms and exam. Currently, only state public health laboratories have the ability to test for norovirus in stool or vomit. TREATMENT  No specific treatment exists for norovirus infections. No  vaccine is available to prevent infections. Norovirus illness is usually brief in healthy people. If you are ill with vomiting and diarrhea, you should drink enough water and fluids to keep your urine clear or pale yellow. Dehydration is the most serious health effect that can result from this infection. By drinking oral rehydration solution (ORS), people can reduce their chance of becoming dehydrated. There are many commercially available pre-made and powdered ORS designed to safely rehydrate people. These may be recommended by your caregiver. Replace any new fluid losses from diarrhea or vomiting with ORS as follows:  If your child weighs 10 kg or less (22 lb or less), give 60 to 120 ml ( to  cup or 2 to 4 oz) of ORS for each diarrheal stool or vomiting episode.   If your child weighs more than 10 kg (more than 22 lb), give 120 to 240 ml ( to 1 cup or 4 to 8 oz) of ORS for each diarrheal stool or vomiting episode.  HOME CARE INSTRUCTIONS   Follow all your caregiver's instructions.   Avoid sugar-free and alcoholic drinks while ill.   Only take over-the-counter or prescription medicines for pain, vomiting, diarrhea, or fever as directed by your caregiver.  You can decrease your chances of coming in contact with norovirus or spreading it by following these steps:  Frequently wash your hands, especially after using the toilet, changing diapers, and before eating or preparing food.   Carefully wash fruits and vegetables. Cook shellfish before eating them.   Do not prepare food for others while you are infected and for at least 3 days after recovering from illness.   Thoroughly clean and disinfect contaminated surfaces immediately after an episode of illness using a bleach-based household cleaner.   Immediately remove and wash clothing or linens that may be contaminated with the virus.   Use the toilet to dispose of any vomit or stool. Make sure the surrounding area is kept clean.   Food  that may have been contaminated by an ill person should be discarded.  SEEK IMMEDIATE MEDICAL CARE IF:   You develop symptoms of dehydration that do not improve with fluid replacement. This may include:   Excessive sleepiness.   Lack of tears.   Dry mouth.   Dizziness when standing.   Weak pulse.  Document Released: 09/05/2002 Document Revised: 02/25/2011 Document Reviewed: 10/07/2009 [facility] Patient Information 2012 [place], [place].

## 2011-08-23 ENCOUNTER — Encounter (HOSPITAL_BASED_OUTPATIENT_CLINIC_OR_DEPARTMENT_OTHER): Payer: Self-pay | Admitting: *Deleted

## 2011-08-23 ENCOUNTER — Emergency Department (HOSPITAL_BASED_OUTPATIENT_CLINIC_OR_DEPARTMENT_OTHER)
Admission: EM | Admit: 2011-08-23 | Discharge: 2011-08-23 | Disposition: A | Discharge status: Home Health | Payer: Commercial Indemnity | Attending: Emergency Medicine | Admitting: Emergency Medicine

## 2011-08-23 ENCOUNTER — Emergency Department (INDEPENDENT_AMBULATORY_CARE_PROVIDER_SITE_OTHER): Payer: Commercial Indemnity

## 2011-08-23 DIAGNOSIS — M79609 Pain in unspecified limb: Secondary | ICD-10-CM

## 2011-08-23 DIAGNOSIS — M7989 Other specified soft tissue disorders: Secondary | ICD-10-CM | POA: Insufficient documentation

## 2011-08-23 DIAGNOSIS — M25579 Pain in unspecified ankle and joints of unspecified foot: Secondary | ICD-10-CM | POA: Insufficient documentation

## 2011-08-23 DIAGNOSIS — M79673 Pain in unspecified foot: Secondary | ICD-10-CM

## 2011-08-23 DIAGNOSIS — E785 Hyperlipidemia, unspecified: Secondary | ICD-10-CM | POA: Insufficient documentation

## 2011-08-23 DIAGNOSIS — Z79899 Other long term (current) drug therapy: Secondary | ICD-10-CM | POA: Insufficient documentation

## 2011-08-23 DIAGNOSIS — I10 Essential (primary) hypertension: Secondary | ICD-10-CM | POA: Insufficient documentation

## 2011-08-23 MED ORDER — IBUPROFEN 800 MG PO TABS
800.0000 mg | ORAL_TABLET | Freq: Once | ORAL | Status: AC
  Administered 2011-08-23: 800 mg via ORAL
  Filled 2011-08-23: qty 1

## 2011-08-23 MED ORDER — IBUPROFEN 800 MG PO TABS: 800.0000 mg | ORAL_TABLET | Freq: Three times a day (TID) | ORAL | Status: AC

## 2011-08-23 NOTE — ED Provider Notes (Signed)
History   This chart was scribed for Gerhard Munch, MD by Melba Coon. The patient was seen in room MH12/MH12 and the patient's care was started at 9:21PM.    CSN: [id number]  Arrival date & time 08/23/11  2040   First MD Initiated Contact with Patient 08/23/11 2107      Chief Complaint  Patient presents with  . Leg Swelling    (Consider location/radiation/quality/duration/timing/severity/associated sxs/prior treatment) HPI Jackson Sherman is a 51 y.o. male who presents to the Emergency Department complaining of constant, moderate to severe right leg pain and swelling with an onset last night. Pt works as an Surveyor, mining and has been at home since his shift ended 3 days ago. Pain has had intermittent foot right foot pain for 3 weeks now. This time, the pain stayed and has traveled throughout the right calf with visible distended veins and discoloration of the skin (increased redness of the purple towards the toes). At presentation, symptoms have improved and at time of exam, distended veins and discoloration were not seen. Wearing shoes and ambulation aggravates the pain. No HA, fever, chills, neck pain, back pain, abd pain, or upper extremity pain. Pt's brother has a Hx of gout. No Hx of kidney stones or blood clots. No abd surgeries. No other pertinent medical symptoms.  Past Medical History  Diagnosis Date  . 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 Hypertension   . Hyperlipidemia   . Depression     Past Surgical History  Procedure Date  . Arthroscopic repair acl     left  . Tonsillectomy     Age 21     Family History  Problem Relation Age of Onset  . Heart disease Maternal Grandmother 50  . Stroke Maternal Grandmother   . Diabetes Maternal Grandmother   . Hyperlipidemia Mother   . Hypertension Mother   . Depression Sister   . Breast cancer Mother 67    History  Substance Use Topics  . Smoking status: Former Smoker -- 15 years    Types: Cigarettes   Quit date: 06/30/2003  . Smokeless tobacco: Not on file  . Alcohol Use: No     Quit 03-2005, attending AA meetings       Review of Systems 10 Systems reviewed and are negative for acute change except as noted in the HPI.  Allergies  Review of patient's allergies indicates no known allergies.  Home Medications   Current Outpatient Rx  Name Route Sig Dispense Refill  . ATORVASTATIN CALCIUM 20 MG PO TABS Oral Take 1 tablet (20 mg total) by mouth at bedtime. 90 tablet 3  . BISMUTH SUBSALICYLATE 262 MG/15ML PO SUSP Oral Take 15 mLs by mouth every 6 (six) hours as needed. For upset stomach    . ESCITALOPRAM OXALATE 20 MG PO TABS Oral Take 1 tablet (20 mg total) by mouth daily. 90 each 3  . FAMOTIDINE 40 MG PO TABS Oral Take 40 mg by mouth daily.      . IBUPROFEN 200 MG PO TABS Oral Take 600 mg by mouth every 6 (six) hours as needed. For pain    . LISINOPRIL 20 MG PO TABS Oral Take 1 tablet (20 mg total) by mouth daily. 90 tablet 3  . LORATADINE 10 MG PO TABS Oral Take 10 mg by mouth daily.    . QUETIAPINE FUMARATE 50 MG PO TABS Oral Take 50 mg by mouth at bedtime as needed. For sleep      BP 129/85  Pulse 105  Temp(Src) 98 F (36.7 C) (Oral)  Resp 20  SpO2 99%  Physical Exam  Nursing note and vitals reviewed. Constitutional: He appears well-developed and well-nourished.       Awake, alert, nontoxic appearance.  HENT:  Head: Normocephalic and atraumatic.  Mouth/Throat: Oropharynx is clear and moist.  Eyes: Conjunctivae and EOM are normal. Pupils are equal, round, and reactive to light. Right eye exhibits no discharge. Left eye exhibits no discharge.  Neck: Normal range of motion. Neck supple.  Cardiovascular: Normal rate and normal heart sounds.   No murmur heard. Pulmonary/Chest: Effort normal and breath sounds normal. He exhibits no tenderness.  Abdominal: Soft. Bowel sounds are normal. There is no tenderness. There is no rebound.  Musculoskeletal: He exhibits edema (Right  calf) and tenderness (Right calf).       Baseline ROM, no obvious new focal weakness.  Neurological:       Mental status and motor strength appears baseline for patient and situation.  Skin: Skin is warm. No rash noted.  Psychiatric: He has a normal mood and affect.    ED Course  Procedures (including critical care time)  DIAGNOSTIC STUDIES: Oxygen Saturation is 96% on room air, adequate by my interpretation.    COORDINATION OF CARE:  9:28PM - EDMD will order leg XR and applied ice; if XRs are negative, EDMD will order anti-inflammatory drugs   Labs Reviewed - No data to display Dg Foot Complete Right  08/23/2011  *RADIOLOGY REPORT*  Clinical Data: Pain at the top of the foot.  RIGHT FOOT COMPLETE - 3+ VIEW  Comparison: None.  Findings: Three views of the right foot were obtained.  Negative for acute fracture or dislocation.  Normal alignment of the foot. Small spur along the plantar aspect of the calcaneus.  IMPRESSION: No acute bony abnormality in the right foot.  Original Report Authenticated By: Richarda Overlie, M.D.   X-ray are reviewed by me  No diagnosis found.    MDM  I personally performed the services described in this documentation, which was scribed in my presence. The recorded information has been reviewed and considered.  This previously well 51 year old male presents with foot pain.  The patient has a history of unclear onset, seemingly about that time he tried a new pair of shoes.  On exam he is in no distress, has tenderness to palpation about the dorsum of the foot.  The patient's description of onset, his physical exam findings are concerning for contusion versus tendinitis, though there was some initial suspicion for stress fracture.  Patient has no risk factors for DVT, he and he is afebrile with no significant erythema, making cellulitis very unlikely.  The patient was discharged in a postoperative shoe to follow up with both orthopedics and his primary care  physician.   Gerhard Munch, MD 08/23/11 2303

## 2011-08-23 NOTE — ED Notes (Signed)
Pt reports no injury or trauma.  Pt has +pedal pulses and discoloration noted to top of foot.

## 2011-08-23 NOTE — Discharge Instructions (Signed)
A specific cause of your pain was not found today's emergency department evaluation.  Her symptoms are most consistent with either a contusion or tendinitis.  As discussed, please return to emergency department for any concerning changes in your condition. It is important that you follow up with your primary care physician, and the orthopedic doctor.

## 2011-08-23 NOTE — ED Notes (Signed)
Pt presents to ED today with RLE swelling and pain for the last few days.  Pt states pain and swelling have increased over the last days.  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

## 2012-03-07 ENCOUNTER — Ambulatory Visit (HOSPITAL_BASED_OUTPATIENT_CLINIC_OR_DEPARTMENT_OTHER)
Admission: RE | Admit: 2012-03-07 | Discharge: 2012-03-07 | Disposition: A | Discharge status: Home / Self Care | Payer: Commercial Indemnity | Source: Ambulatory Visit | Attending: Occupational Medicine | Admitting: Occupational Medicine

## 2012-03-07 ENCOUNTER — Other Ambulatory Visit: Payer: Self-pay | Admitting: Occupational Medicine

## 2012-03-07 DIAGNOSIS — Z0000 Encounter for general adult medical examination without abnormal findings: Secondary | ICD-10-CM

## 2012-03-07 LAB — LIPID PANEL
Cholesterol: 171 mg/dL (ref 0–200)
HDL: 40 mg/dL (ref 35–70)
LDL Cholesterol: 117 mg/dL

## 2012-03-07 LAB — BASIC METABOLIC PANEL: Creatinine: 1.2 mg/dL (ref 0.6–1.3)

## 2012-06-24 ENCOUNTER — Other Ambulatory Visit: Payer: Self-pay | Admitting: Family Medicine

## 2012-08-15 ENCOUNTER — Other Ambulatory Visit: Payer: Self-pay | Admitting: Family Medicine

## 2012-08-22 ENCOUNTER — Ambulatory Visit (INDEPENDENT_AMBULATORY_CARE_PROVIDER_SITE_OTHER): Payer: Commercial Indemnity | Admitting: Family Medicine

## 2012-08-22 ENCOUNTER — Encounter: Payer: Self-pay | Admitting: Family Medicine

## 2012-08-22 VITALS — BP 121/80 | HR 109 | Ht 70.0 in | Wt 245.0 lb

## 2012-08-22 DIAGNOSIS — F329 Major depressive disorder, single episode, unspecified: Secondary | ICD-10-CM | POA: Insufficient documentation

## 2012-08-22 DIAGNOSIS — E785 Hyperlipidemia, unspecified: Secondary | ICD-10-CM

## 2012-08-22 DIAGNOSIS — Z1211 Encounter for screening for malignant neoplasm of colon: Secondary | ICD-10-CM

## 2012-08-22 DIAGNOSIS — G4700 Insomnia, unspecified: Secondary | ICD-10-CM

## 2012-08-22 DIAGNOSIS — I10 Essential (primary) hypertension: Secondary | ICD-10-CM

## 2012-08-22 DIAGNOSIS — F339 Major depressive disorder, recurrent, unspecified: Secondary | ICD-10-CM | POA: Insufficient documentation

## 2012-08-22 MED ORDER — ATORVASTATIN CALCIUM 20 MG PO TABS: ORAL_TABLET | ORAL | Status: DC

## 2012-08-22 MED ORDER — QUETIAPINE FUMARATE 50 MG PO TABS: 50.0000 mg | ORAL_TABLET | Freq: Every evening | ORAL | Status: DC | PRN

## 2012-08-22 MED ORDER — FAMOTIDINE 40 MG PO TABS: 40.0000 mg | ORAL_TABLET | Freq: Every day | ORAL | Status: DC

## 2012-08-22 MED ORDER — LISINOPRIL 20 MG PO TABS: ORAL_TABLET | ORAL | Status: DC

## 2012-08-22 MED ORDER — ESCITALOPRAM OXALATE 10 MG PO TABS: 10.0000 mg | ORAL_TABLET | Freq: Every day | ORAL | Status: DC
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
## 2012-08-24 ENCOUNTER — Encounter: Payer: Self-pay | Admitting: *Deleted

## 2012-11-02 ENCOUNTER — Encounter (INDEPENDENT_AMBULATORY_CARE_PROVIDER_SITE_OTHER): Payer: Self-pay | Admitting: General Surgery

## 2012-11-02 ENCOUNTER — Ambulatory Visit (INDEPENDENT_AMBULATORY_CARE_PROVIDER_SITE_OTHER): Payer: Commercial Indemnity | Admitting: General Surgery

## 2012-11-02 VITALS — BP 124/86 | HR 72 | Temp 97.1°F | Resp 18 | Ht 70.0 in | Wt 240.6 lb

## 2012-11-02 DIAGNOSIS — K645 Perianal venous thrombosis: Secondary | ICD-10-CM

## 2012-11-02 MED ORDER — HYDROCODONE-ACETAMINOPHEN 5-325 MG PO TABS: 1.0000 | ORAL_TABLET | ORAL | Status: DC | PRN

## 2012-11-02 MED ORDER — DOCUSATE SODIUM 100 MG PO CAPS: 100.0000 mg | ORAL_CAPSULE | Freq: Two times a day (BID) | ORAL | Status: DC

## 2012-11-02 MED ORDER — HYDROCORTISONE 2.5 % RE CREA: TOPICAL_CREAM | Freq: Two times a day (BID) | RECTAL | Status: DC

## 2012-11-02 NOTE — Progress Notes (Signed)
Patient ID: Jackson Sherman, male   DOB: 1961/03/25, 52 y.o.   MRN: [id number]  No chief complaint on file.   HPI Jackson Sherman is a 52 y.o. male.  This patient presents for evaluation of hemorrhoids. He says that he had a history of thrombosed hemorrhoid in 08/2009 which required incision and drainage by Dr. Freida Busman. Since then he has been taking fiber daily and he says he has normal bowel movements but has occasional swelling and bulging in the area of concern. He said that beginning last week he noted reducible bulges in the rectal area and had significant bleeding and the toilet as well with bright red blood. Since then he has had pretty severe discomfort when sitting he he says that the bulge is reduced in the morning and at night but after he moves his bowels in the morning, these hemorrhoids we'll bulge out and began causing significant discomfort until the evening time. He has difficulty with hygiene in significant pain in the area. He has been trying to get relief with over-the-counter medication such as Preparation H and stool softeners but has not had any relief of his symptoms. HPI  Past Medical History  Diagnosis Date  . 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 Hypertension   . Hyperlipidemia   . Depression     Past Surgical History  Procedure Laterality Date  . Arthroscopic repair acl      left  . Tonsillectomy      Age 34     Family History  Problem Relation Age of Onset  . Heart disease Maternal Grandmother 50  . Stroke Maternal Grandmother   . Diabetes Maternal Grandmother   . Hyperlipidemia Mother   . Hypertension Mother   . Depression Sister   . Breast cancer Mother 37    Social History History  Substance Use Topics  . Smoking status: Former Smoker -- 15 years    Types: Cigarettes    Quit date: 06/30/2003  . Smokeless tobacco: Not on file  . Alcohol Use: No     Comment: Quit 03-2005, attending AA meetings     No Known Allergies  Current Outpatient  Prescriptions  Medication Sig Dispense Refill  . atorvastatin (LIPITOR) 20 MG tablet TAKE ONE TABLET BY MOUTH AT BEDTIME-  90 tablet  1  . escitalopram (LEXAPRO) 10 MG tablet Take 1 tablet (10 mg total) by mouth daily.  90 tablet  0  . famotidine (PEPCID) 40 MG tablet Take 1 tablet (40 mg total) by mouth daily.  90 tablet  1  . ibuprofen (ADVIL,MOTRIN) 200 MG tablet Take 600 mg by mouth every 6 (six) hours as needed. For pain      . lisinopril (PRINIVIL,ZESTRIL) 20 MG tablet TAKE ONE TABLET BY MOUTH EVERY DAY  90 tablet  1  . loratadine (CLARITIN) 10 MG tablet Take 10 mg by mouth daily.      . QUEtiapine (SEROQUEL) 50 MG tablet Take 1 tablet (50 mg total) by mouth at bedtime as needed. For sleep  90 tablet  1  . docusate sodium (COLACE) 100 MG capsule Take 1 capsule (100 mg total) by mouth 2 (two) times daily.  60 capsule  1  . HYDROcodone-acetaminophen (NORCO) 5-325 MG per tablet Take 1 tablet by mouth every 4 (four) hours as needed for pain.  50 tablet  0  . hydrocortisone (ANUSOL-HC) 2.5 % rectal cream Place rectally 2 (two) times daily.  30 g  0   No current facility-administered medications  for this visit.    Review of Systems Review of Systems All other review of systems negative or noncontributory except as stated in the HPI  Blood pressure 124/86, pulse 72, temperature 97.1 F (36.2 C), temperature source Temporal, resp. rate 18, height 5\' 10"  (1.778 m), weight 240 lb 9.6 oz (109.135 kg).  Physical Exam Physical Exam Physical Exam  Vitals reviewed. Constitutional: He is oriented to person, place, and time. He appears well-developed and well-nourished. No distress.  HENT:  Head: Normocephalic and atraumatic.  Mouth/Throat: No oropharyngeal exudate.  Eyes: Conjunctivae and EOM are normal. Pupils are equal, round, and reactive to light. Right eye exhibits no discharge. Left eye exhibits no discharge. No scleral icterus.  Neck: Normal range of motion. No tracheal deviation  present.  Cardiovascular: Normal rate, regular rhythm and normal heart sounds.   Pulmonary/Chest: Effort normal and breath sounds normal. No stridor. No respiratory distress. He has no wheezes. He has no rales. He exhibits no tenderness.  Abdominal: Soft. Bowel sounds are normal. He exhibits no distension and no mass. There is no tenderness. There is no rebound and no guarding.  Musculoskeletal: Normal range of motion. He exhibits no edema and no tenderness.  Neurological: He is alert and oriented to person, place, and time.  Skin: Skin is warm and dry. No rash noted. He is not diaphoretic. No erythema. No pallor.  Psychiatric: He has a normal mood and affect. His behavior is normal. Judgment and thought content normal.  Rectal:  He has fairly significant bulging and thrombosed hemorrhoids in the right anterior and right posterior columns with the posterior column being the greatest in size. He does have evidence of bleeding and doesn't really have any significant left-sided disease as far as I can tell. Digital rectal exam was not performed today due to tenderness  Data Reviewed   Assessment    Hemorrhoids with thrombosis He does have a fairly large significant bleeding and painful thrombosed hemorrhoids in the right anterior and right posterior columns. I discussed with him the options for conservative management versus surgical hemorrhoidectomy and I think that he would benefit from hemorrhoidectomy. He has tried conservative management and does take his fiber routinely and seems to have good bowel hygiene but continues to have issues. I discussed with him rectal exam under anesthesia with hemorrhoidectomy including the pros and cons and risks and benefits of the procedure. I did discuss with them the risk of infection, bleeding, pain, scarring, stricture, incontinence, and recurrence and he expressed understanding and would like to proceed with rectal exam under anesthesia with bursectomy. I also  asked Dr. Maisie Fus to evaluate him as well she feels that this should be amenable to excision of both columns but ideally after the acute swelling as resolved. We will try to [facility] and stool softeners and pain control and we will schedule surgery in about 2 weeks.     Plan    Pain medication, Anusol HC, Colace, resume fiber and water intake  We will set him up for surgery in about 2 weeks after the acute inflammation has resolved       Ceria Suminski DAVID 11/02/2012, 2:12 PM

## 2012-11-07 ENCOUNTER — Encounter (HOSPITAL_COMMUNITY): Payer: Self-pay | Admitting: Pharmacy Technician

## 2012-11-09 ENCOUNTER — Other Ambulatory Visit (HOSPITAL_COMMUNITY): Payer: Self-pay | Admitting: *Deleted

## 2012-11-10 ENCOUNTER — Encounter (HOSPITAL_COMMUNITY): Payer: Self-pay

## 2012-11-10 ENCOUNTER — Encounter (HOSPITAL_COMMUNITY)
Admission: RE | Admit: 2012-11-10 | Discharge: 2012-11-10 | Disposition: A | Discharge status: Home / Self Care | Payer: Commercial Indemnity | Source: Ambulatory Visit | Attending: General Surgery | Admitting: General Surgery

## 2012-11-10 VITALS — BP 118/87 | HR 64 | Temp 97.8°F | Resp 16 | Ht 70.0 in | Wt 239.1 lb

## 2012-11-10 DIAGNOSIS — K645 Perianal venous thrombosis: Secondary | ICD-10-CM

## 2012-11-10 HISTORY — DX: Gastro-esophageal reflux disease without esophagitis: K21.9

## 2012-11-10 HISTORY — DX: Unspecified hemorrhoids: K64.9

## 2012-11-10 LAB — CBC
MCH: 28.2 pg (ref 26.0–34.0)
MCHC: 32.8 g/dL (ref 30.0–36.0)
MCV: 86 fL (ref 78.0–100.0)
Platelets: 248 10*3/uL (ref 150–400)
RBC: 4.72 MIL/uL (ref 4.22–5.81)
RDW: 13.3 % (ref 11.5–15.5)

## 2012-11-10 LAB — SURGICAL PCR SCREEN: Staphylococcus aureus: POSITIVE — AB

## 2012-11-10 LAB — BASIC METABOLIC PANEL
Calcium: 9.1 mg/dL (ref 8.4–10.5)
Creatinine, Ser: 0.96 mg/dL (ref 0.50–1.35)
GFR calc non Af Amer: >90 mL/min (ref >90)
Glucose, Bld: 96 mg/dL (ref 70–99)
Sodium: 139 mEq/L (ref 135–145)

## 2012-11-10 NOTE — Progress Notes (Signed)
11/10/12 0924  OBSTRUCTIVE SLEEP APNEA  Have you ever been diagnosed with sleep apnea through a sleep study? No  Do you snore loudly (loud enough to be heard through closed doors)?  0  Do you often feel tired, fatigued, or sleepy during the daytime? 0  Has anyone observed you stop breathing during your sleep? 0  Do you have, or are you being treated for high blood pressure? 1  BMI more than 35 kg/m2? 0  Age over 53 years old? 1  Neck circumference greater than 40 cm/18 inches? 1  Gender: 1  Obstructive Sleep Apnea Score 4  Score 4 or greater  Results sent to PCP

## 2012-11-10 NOTE — Patient Instructions (Signed)
Jackson Sherman  11/10/2012                           YOUR PROCEDURE IS SCHEDULED ON: 11/23/12               PLEASE REPORT TO SHORT STAY CENTER AT : 10:30 AM               CALL THIS NUMBER IF ANY PROBLEMS THE DAY OF SURGERY :               [phone]                      REMEMBER:   Do not eat food or drink liquids AFTER MIDNIGHT  May have clear liquids UNTIL 6 HOURS BEFORE SURGERY (7:00 AM)  Clear liquids include soda, tea, black coffee, apple or grape juice, broth.  Take these medicines the morning of surgery with A SIP OF WATER: PEPCID / CLARITIN / MAY TAKE HYDROCODONE IF NEEDED   Do not wear jewelry, make-up   Do not wear lotions, powders, or perfumes.   Do not shave legs or underarms 12 hrs. before surgery (men may shave face)  Do not bring valuables to the hospital.  Contacts, dentures or bridgework may not be worn into surgery.  Leave suitcase in the car. After surgery it may be brought to your room.  For patients admitted to the hospital more than one night, checkout time is 11:00                          The day of discharge.   Patients discharged the day of surgery will not be allowed to drive home                             If going home same day of surgery, must have someone stay with you first                           24 hrs at home and arrange for some one to drive you home from hospital.    Special Instructions:   Please read over the following fact sheets that you were given:               1. MRSA  INFORMATION                      2. [place] PREPARING FOR SURGERY SHEET                                                X_____________________________________________________________________        Failure to follow these instructions may result in cancellation of your surgery

## 2012-11-13 ENCOUNTER — Telehealth: Payer: Self-pay | Admitting: Family Medicine

## 2012-11-13 NOTE — Telephone Encounter (Signed)
Call pt: got a copy of screen for sleep apnea and he screened positive . Would he be interested is being tested for sleep apnea.

## 2012-11-14 NOTE — Telephone Encounter (Signed)
Called pt left message with daughter for him to return call.Jackson Sherman [place]

## 2012-11-15 NOTE — Telephone Encounter (Signed)
Called pt and he is interested however he has an upcoming surgery scheduled and will discuss this at a later date.Loralee Pacas [place]

## 2012-11-23 ENCOUNTER — Ambulatory Visit (HOSPITAL_COMMUNITY)
Admission: RE | Admit: 2012-11-23 | Discharge: 2012-11-23 | Disposition: A | Discharge status: Home / Self Care | Payer: Commercial Indemnity | Source: Ambulatory Visit | Attending: General Surgery | Admitting: General Surgery

## 2012-11-23 ENCOUNTER — Ambulatory Visit (HOSPITAL_COMMUNITY): Payer: Commercial Indemnity | Admitting: Anesthesiology

## 2012-11-23 ENCOUNTER — Encounter (HOSPITAL_COMMUNITY): Payer: Self-pay | Admitting: *Deleted

## 2012-11-23 ENCOUNTER — Encounter (HOSPITAL_COMMUNITY)
Admission: RE | Disposition: A | Discharge status: Home / Self Care | Payer: Self-pay | Source: Ambulatory Visit | Attending: General Surgery

## 2012-11-23 ENCOUNTER — Encounter (HOSPITAL_COMMUNITY): Payer: Self-pay | Admitting: Anesthesiology

## 2012-11-23 DIAGNOSIS — F329 Major depressive disorder, single episode, unspecified: Secondary | ICD-10-CM | POA: Insufficient documentation

## 2012-11-23 DIAGNOSIS — Z87891 Personal history of nicotine dependence: Secondary | ICD-10-CM | POA: Insufficient documentation

## 2012-11-23 DIAGNOSIS — K644 Residual hemorrhoidal skin tags: Secondary | ICD-10-CM | POA: Insufficient documentation

## 2012-11-23 DIAGNOSIS — K648 Other hemorrhoids: Secondary | ICD-10-CM

## 2012-11-23 DIAGNOSIS — K645 Perianal venous thrombosis: Secondary | ICD-10-CM

## 2012-11-23 DIAGNOSIS — E785 Hyperlipidemia, unspecified: Secondary | ICD-10-CM | POA: Insufficient documentation

## 2012-11-23 DIAGNOSIS — Z79899 Other long term (current) drug therapy: Secondary | ICD-10-CM | POA: Insufficient documentation

## 2012-11-23 DIAGNOSIS — F3289 Other specified depressive episodes: Secondary | ICD-10-CM | POA: Insufficient documentation

## 2012-11-23 DIAGNOSIS — I10 Essential (primary) hypertension: Secondary | ICD-10-CM | POA: Insufficient documentation

## 2012-11-23 HISTORY — PX: HEMORRHOID SURGERY: SHX153

## 2012-11-23 SURGERY — HEMORRHOIDECTOMY
Anesthesia: General | Site: Rectum | Wound class: Clean Contaminated

## 2012-11-23 MED ORDER — LACTATED RINGERS IV SOLN
INTRAVENOUS | Status: DC | PRN
  Administered 2012-11-23: 12:00:00 via INTRAVENOUS

## 2012-11-23 MED ORDER — MINERAL OIL PO OIL: 5.0000 mL | TOPICAL_OIL | Freq: Three times a day (TID) | ORAL | Status: DC | PRN

## 2012-11-23 MED ORDER — CIPROFLOXACIN IN D5W 400 MG/200ML IV SOLN
INTRAVENOUS | Status: AC
  Filled 2012-11-23: qty 200

## 2012-11-23 MED ORDER — ACETAMINOPHEN 10 MG/ML IV SOLN
INTRAVENOUS | Status: DC | PRN
  Administered 2012-11-23: 1000 mg via INTRAVENOUS

## 2012-11-23 MED ORDER — ONDANSETRON HCL 4 MG/2ML IJ SOLN
INTRAMUSCULAR | Status: DC | PRN
  Administered 2012-11-23: 4 mg via INTRAVENOUS

## 2012-11-23 MED ORDER — BUPIVACAINE LIPOSOME 1.3 % IJ SUSP
INTRAMUSCULAR | Status: DC | PRN
  Administered 2012-11-23: 20 mL

## 2012-11-23 MED ORDER — MIDAZOLAM HCL 5 MG/5ML IJ SOLN
INTRAMUSCULAR | Status: DC | PRN
  Administered 2012-11-23: 2 mg via INTRAVENOUS

## 2012-11-23 MED ORDER — ROCURONIUM BROMIDE 100 MG/10ML IV SOLN
INTRAVENOUS | Status: DC | PRN
  Administered 2012-11-23: 5 mg via INTRAVENOUS

## 2012-11-23 MED ORDER — BUPIVACAINE LIPOSOME 1.3 % IJ SUSP
20.0000 mL | Freq: Once | INTRAMUSCULAR | Status: DC
  Filled 2012-11-23: qty 20

## 2012-11-23 MED ORDER — OXYCODONE HCL 5 MG PO TABS: 10.0000 mg | ORAL_TABLET | ORAL | Status: DC | PRN

## 2012-11-23 MED ORDER — LACTATED RINGERS IV SOLN: INTRAVENOUS | Status: DC

## 2012-11-23 MED ORDER — FENTANYL CITRATE 0.05 MG/ML IJ SOLN
INTRAMUSCULAR | Status: DC | PRN
  Administered 2012-11-23: 50 ug via INTRAVENOUS
  Administered 2012-11-23: 100 ug via INTRAVENOUS
  Administered 2012-11-23 (×2): 50 ug via INTRAVENOUS
  Administered 2012-11-23: 100 ug via INTRAVENOUS
  Administered 2012-11-23: 50 ug via INTRAVENOUS

## 2012-11-23 MED ORDER — DOCUSATE SODIUM 100 MG PO CAPS: 100.0000 mg | ORAL_CAPSULE | Freq: Two times a day (BID) | ORAL | Status: DC

## 2012-11-23 MED ORDER — PROPOFOL 10 MG/ML IV BOLUS
INTRAVENOUS | Status: DC | PRN
  Administered 2012-11-23: 200 mg via INTRAVENOUS

## 2012-11-23 MED ORDER — ACETAMINOPHEN 10 MG/ML IV SOLN
INTRAVENOUS | Status: AC
  Filled 2012-11-23: qty 100

## 2012-11-23 MED ORDER — SUCCINYLCHOLINE CHLORIDE 20 MG/ML IJ SOLN
INTRAMUSCULAR | Status: DC | PRN
  Administered 2012-11-23: 100 mg via INTRAVENOUS

## 2012-11-23 MED ORDER — PROMETHAZINE HCL 25 MG/ML IJ SOLN: 6.2500 mg | INTRAMUSCULAR | Status: DC | PRN

## 2012-11-23 MED ORDER — DIBUCAINE 1 % RE OINT
TOPICAL_OINTMENT | RECTAL | Status: DC | PRN
  Administered 2012-11-23: 1 via RECTAL

## 2012-11-23 MED ORDER — DIBUCAINE 1 % RE OINT
TOPICAL_OINTMENT | RECTAL | Status: AC
  Filled 2012-11-23: qty 28

## 2012-11-23 MED ORDER — FENTANYL CITRATE 0.05 MG/ML IJ SOLN: 25.0000 ug | INTRAMUSCULAR | Status: DC | PRN

## 2012-11-23 MED ORDER — LIDOCAINE HCL (CARDIAC) 20 MG/ML IV SOLN
INTRAVENOUS | Status: DC | PRN
  Administered 2012-11-23: 50 mg via INTRAVENOUS

## 2012-11-23 MED ORDER — MEPERIDINE HCL 50 MG/ML IJ SOLN: 6.2500 mg | INTRAMUSCULAR | Status: DC | PRN

## 2012-11-23 MED ORDER — CIPROFLOXACIN IN D5W 400 MG/200ML IV SOLN
400.0000 mg | INTRAVENOUS | Status: AC
  Administered 2012-11-23: 400 mg via INTRAVENOUS

## 2012-11-23 MED ORDER — OXYCODONE HCL 5 MG PO TABS
10.0000 mg | ORAL_TABLET | ORAL | Status: DC | PRN
  Administered 2012-11-23: 10 mg via ORAL
  Filled 2012-11-23: qty 2

## 2012-11-23 SURGICAL SUPPLY — 42 items
BLADE HEX COATED 2.75 (ELECTRODE) ×3 IMPLANT
BLADE SURG 15 STRL LF DISP TIS (BLADE) ×2 IMPLANT
BLADE SURG 15 STRL SS (BLADE) ×3
BRIEF STRETCH FOR OB PAD LRG (UNDERPADS AND DIAPERS) ×3 IMPLANT
CANISTER SUCTION 2500CC (MISCELLANEOUS) ×3 IMPLANT
CLOTH BEACON ORANGE TIMEOUT ST (SAFETY) ×3 IMPLANT
COVER MAYO STAND STRL (DRAPES) ×3 IMPLANT
DECANTER SPIKE VIAL GLASS SM (MISCELLANEOUS) ×3 IMPLANT
DRSG PAD ABDOMINAL 8X10 ST (GAUZE/BANDAGES/DRESSINGS) IMPLANT
ELECT REM PT RETURN 9FT ADLT (ELECTROSURGICAL) ×3
ELECTRODE REM PT RTRN 9FT ADLT (ELECTROSURGICAL) ×2 IMPLANT
GAUZE SPONGE 4X4 16PLY XRAY LF (GAUZE/BANDAGES/DRESSINGS) ×3 IMPLANT
GAUZE VASELINE 3X9 (GAUZE/BANDAGES/DRESSINGS) IMPLANT
GLOVE BIO SURGEON STRL SZ7 (GLOVE) ×3 IMPLANT
GLOVE BIOGEL PI IND STRL 7.0 (GLOVE) ×2 IMPLANT
GLOVE BIOGEL PI INDICATOR 7.0 (GLOVE) ×1
GLOVE SURG SS PI 7.5 STRL IVOR (GLOVE) ×6 IMPLANT
GOWN PREVENTION PLUS LG XLONG (DISPOSABLE) ×3 IMPLANT
GOWN STRL NON-REIN LRG LVL3 (GOWN DISPOSABLE) ×3 IMPLANT
GOWN STRL REIN XL XLG (GOWN DISPOSABLE) ×6 IMPLANT
KIT BASIN OR (CUSTOM PROCEDURE TRAY) ×3 IMPLANT
LUBRICANT JELLY K Y 4OZ (MISCELLANEOUS) ×3 IMPLANT
NDL HYPO 25X1 1.5 SAFETY (NEEDLE) ×2 IMPLANT
NDL SAFETY ECLIPSE 18X1.5 (NEEDLE) IMPLANT
NEEDLE HYPO 18GX1.5 SHARP (NEEDLE)
NEEDLE HYPO 25X1 1.5 SAFETY (NEEDLE) ×3 IMPLANT
NS IRRIG 1000ML POUR BTL (IV SOLUTION) ×3 IMPLANT
PACK BASIC VI WITH GOWN DISP (CUSTOM PROCEDURE TRAY) ×3 IMPLANT
PENCIL BUTTON HOLSTER BLD 10FT (ELECTRODE) ×3 IMPLANT
SPONGE GAUZE 4X4 12PLY (GAUZE/BANDAGES/DRESSINGS) IMPLANT
SPONGE HEMORRHOID 8X3CM (HEMOSTASIS) ×3 IMPLANT
SPONGE SURGIFOAM ABS GEL 12-7 (HEMOSTASIS) ×9 IMPLANT
SUT CHROMIC 2 0 SH (SUTURE) IMPLANT
SUT CHROMIC 3 0 SH 27 (SUTURE) ×1 IMPLANT
SUT MON AB 3-0 SH 27 (SUTURE) ×6
SUT MON AB 3-0 SH27 (SUTURE) IMPLANT
SUT VIC AB 3-0 SH 18 (SUTURE) ×1 IMPLANT
SUT VIC AB 4-0 P-3 18XBRD (SUTURE) IMPLANT
SUT VIC AB 4-0 P3 18 (SUTURE)
SYR CONTROL 10ML LL (SYRINGE) ×3 IMPLANT
TOWEL OR 17X26 10 PK STRL BLUE (TOWEL DISPOSABLE) ×3 IMPLANT
YANKAUER SUCT BULB TIP 10FT TU (MISCELLANEOUS) ×3 IMPLANT

## 2012-11-23 NOTE — H&P (View-Only) (Signed)
Patient ID: Jackson Sherman, male   DOB: 04/19/1961, 52 y.o.   MRN: [id number]  No chief complaint on file.   HPI Jackson Sherman is a 52 y.o. male.  This patient presents for evaluation of hemorrhoids. He says that he had a history of thrombosed hemorrhoid in 08/2009 which required incision and drainage by Dr. Allen. Since then he has been taking fiber daily and he says he has normal bowel movements but has occasional swelling and bulging in the area of concern. He said that beginning last week he noted reducible bulges in the rectal area and had significant bleeding and the toilet as well with bright red blood. Since then he has had pretty severe discomfort when sitting he he says that the bulge is reduced in the morning and at night but after he moves his bowels in the morning, these hemorrhoids we'll bulge out and began causing significant discomfort until the evening time. He has difficulty with hygiene in significant pain in the area. He has been trying to get relief with over-the-counter medication such as Preparation H and stool softeners but has not had any relief of his symptoms. HPI  Past Medical History  Diagnosis Date  . 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 Hypertension   . Hyperlipidemia   . Depression     Past Surgical History  Procedure Laterality Date  . Arthroscopic repair acl      left  . Tonsillectomy      Age 5     Family History  Problem Relation Age of Onset  . Heart disease Maternal Grandmother 50  . Stroke Maternal Grandmother   . Diabetes Maternal Grandmother   . Hyperlipidemia Mother   . Hypertension Mother   . Depression Sister   . Breast cancer Mother 79    Social History History  Substance Use Topics  . Smoking status: Former Smoker -- 15 years    Types: Cigarettes    Quit date: 06/30/2003  . Smokeless tobacco: Not on file  . Alcohol Use: No     Comment: Quit 03-2005, attending AA meetings     No Known Allergies  Current Outpatient  Prescriptions  Medication Sig Dispense Refill  . atorvastatin (LIPITOR) 20 MG tablet TAKE ONE TABLET BY MOUTH AT BEDTIME-  90 tablet  1  . escitalopram (LEXAPRO) 10 MG tablet Take 1 tablet (10 mg total) by mouth daily.  90 tablet  0  . famotidine (PEPCID) 40 MG tablet Take 1 tablet (40 mg total) by mouth daily.  90 tablet  1  . ibuprofen (ADVIL,MOTRIN) 200 MG tablet Take 600 mg by mouth every 6 (six) hours as needed. For pain      . lisinopril (PRINIVIL,ZESTRIL) 20 MG tablet TAKE ONE TABLET BY MOUTH EVERY DAY  90 tablet  1  . loratadine (CLARITIN) 10 MG tablet Take 10 mg by mouth daily.      . QUEtiapine (SEROQUEL) 50 MG tablet Take 1 tablet (50 mg total) by mouth at bedtime as needed. For sleep  90 tablet  1  . docusate sodium (COLACE) 100 MG capsule Take 1 capsule (100 mg total) by mouth 2 (two) times daily.  60 capsule  1  . HYDROcodone-acetaminophen (NORCO) 5-325 MG per tablet Take 1 tablet by mouth every 4 (four) hours as needed for pain.  50 tablet  0  . hydrocortisone (ANUSOL-HC) 2.5 % rectal cream Place rectally 2 (two) times daily.  30 g  0   No current facility-administered medications   for this visit.    Review of Systems Review of Systems All other review of systems negative or noncontributory except as stated in the HPI  Blood pressure 124/86, pulse 72, temperature 97.1 F (36.2 C), temperature source Temporal, resp. rate 18, height 5' 10" (1.778 m), weight 240 lb 9.6 oz (109.135 kg).  Physical Exam Physical Exam Physical Exam  Vitals reviewed. Constitutional: He is oriented to person, place, and time. He appears well-developed and well-nourished. No distress.  HENT:  Head: Normocephalic and atraumatic.  Mouth/Throat: No oropharyngeal exudate.  Eyes: Conjunctivae and EOM are normal. Pupils are equal, round, and reactive to light. Right eye exhibits no discharge. Left eye exhibits no discharge. No scleral icterus.  Neck: Normal range of motion. No tracheal deviation  present.  Cardiovascular: Normal rate, regular rhythm and normal heart sounds.   Pulmonary/Chest: Effort normal and breath sounds normal. No stridor. No respiratory distress. He has no wheezes. He has no rales. He exhibits no tenderness.  Abdominal: Soft. Bowel sounds are normal. He exhibits no distension and no mass. There is no tenderness. There is no rebound and no guarding.  Musculoskeletal: Normal range of motion. He exhibits no edema and no tenderness.  Neurological: He is alert and oriented to person, place, and time.  Skin: Skin is warm and dry. No rash noted. He is not diaphoretic. No erythema. No pallor.  Psychiatric: He has a normal mood and affect. His behavior is normal. Judgment and thought content normal.  Rectal:  He has fairly significant bulging and thrombosed hemorrhoids in the right anterior and right posterior columns with the posterior column being the greatest in size. He does have evidence of bleeding and doesn't really have any significant left-sided disease as far as I can tell. Digital rectal exam was not performed today due to tenderness  Data Reviewed   Assessment    Hemorrhoids with thrombosis He does have a fairly large significant bleeding and painful thrombosed hemorrhoids in the right anterior and right posterior columns. I discussed with him the options for conservative management versus surgical hemorrhoidectomy and I think that he would benefit from hemorrhoidectomy. He has tried conservative management and does take his fiber routinely and seems to have good bowel hygiene but continues to have issues. I discussed with him rectal exam under anesthesia with hemorrhoidectomy including the pros and cons and risks and benefits of the procedure. I did discuss with them the risk of infection, bleeding, pain, scarring, stricture, incontinence, and recurrence and he expressed understanding and would like to proceed with rectal exam under anesthesia with bursectomy. I also  asked Dr. Thomas to evaluate him as well she feels that this should be amenable to excision of both columns but ideally after the acute swelling as resolved. We will try to Anusol HC and stool softeners and pain control and we will schedule surgery in about 2 weeks.     Plan    Pain medication, Anusol HC, Colace, resume fiber and water intake  We will set him up for surgery in about 2 weeks after the acute inflammation has resolved       Salam Micucci DAVID 11/02/2012, 2:12 PM    

## 2012-11-23 NOTE — Brief Op Note (Signed)
11/23/2012  2:00 PM  PATIENT:  Jackson Sherman  52 y.o. male  PRE-OPERATIVE DIAGNOSIS:  hemorrhoids  POST-OPERATIVE DIAGNOSIS:  hemorrhoids  PROCEDURE:  Procedure(s): HEMORRHOIDECTOMY (N/A) RECTAL EXAM UNDER ANESTHESIA (N/A)  SURGEON:  Surgeon(s) and Role:    * Lodema Pilot, DO - Primary  PHYSICIAN ASSISTANT:   ASSISTANTS: none   ANESTHESIA:   general  EBL:  Total I/O In: -  Out: 100 [Blood:100]  BLOOD ADMINISTERED:none  DRAINS: none   LOCAL MEDICATIONS USED:  OTHER exparel   SPECIMEN:  Source of Specimen:  right posterior hemorrhoid  DISPOSITION OF SPECIMEN:  PATHOLOGY  COUNTS:  YES  TOURNIQUET:  * No tourniquets in log *  DICTATION: .Other Dictation: Dictation Number dictated  PLAN OF CARE: Discharge to home after PACU  PATIENT DISPOSITION:  PACU - hemodynamically stable.   Delay start of Pharmacological VTE agent (>24hrs) due to surgical blood loss or risk of bleeding: no

## 2012-11-23 NOTE — Anesthesia Preprocedure Evaluation (Addendum)
Anesthesia Evaluation  Patient identified by MRN, date of birth, ID band Patient awake    Reviewed: Allergy & Precautions, H&P , NPO status , Patient's Chart, lab work & pertinent test results  Airway Mallampati: III TM Distance: >3 FB Neck ROM: Full    Dental no notable dental hx. (+) Poor Dentition   Pulmonary neg pulmonary ROS,  breath sounds clear to auscultation  Pulmonary exam normal       Cardiovascular hypertension, Pt. on medications negative cardio ROS  Rhythm:Regular Rate:Normal     Neuro/Psych negative neurological ROS  negative psych ROS   GI/Hepatic negative GI ROS, Neg liver ROS, GERD-  Medicated and Controlled,(+)     substance abuse  alcohol use,   Endo/Other  negative endocrine ROS  Renal/GU negative Renal ROS  negative genitourinary   Musculoskeletal negative musculoskeletal ROS (+)   Abdominal   Peds negative pediatric ROS (+)  Hematology negative hematology ROS (+)   Anesthesia Other Findings   Reproductive/Obstetrics negative OB ROS                         Anesthesia Physical Anesthesia Plan  ASA: II  Anesthesia Plan: General   Post-op Pain Management:    Induction: Intravenous  Airway Management Planned: LMA and Oral ETT  Additional Equipment:   Intra-op Plan:   Post-operative Plan: Extubation in OR  Informed Consent: I have reviewed the patients History and Physical, chart, labs and discussed the procedure including the risks, benefits and alternatives for the proposed anesthesia with the patient or authorized representative who has indicated his/her understanding and acceptance.   Dental advisory given  Plan Discussed with: CRNA  Anesthesia Plan Comments:         Anesthesia Quick Evaluation

## 2012-11-23 NOTE — Transfer of Care (Signed)
Immediate Anesthesia Transfer of Care Note  Patient: Jackson Sherman  Procedure(s) Performed: Procedure(s): HEMORRHOIDECTOMY (N/A) RECTAL EXAM UNDER ANESTHESIA (N/A)  Patient Location: PACU  Anesthesia Type:General  Level of Consciousness: awake, alert , oriented, patient cooperative and responds to stimulation  Airway & Oxygen Therapy: Patient Spontanous Breathing and Patient connected to face mask oxygen  Post-op Assessment: Report given to PACU RN, Post -op Vital signs reviewed and stable and Patient moving all extremities X 4  Post vital signs: stable  Complications: No apparent anesthesia complications

## 2012-11-23 NOTE — Interval H&P Note (Signed)
History and Physical Interval Note:  11/23/2012 12:26 PM  Brown Human  has presented today for surgery, with the diagnosis of hemorrhoids  The various methods of treatment have been discussed with the patient and family. After consideration of risks, benefits and other options for treatment, the patient has consented to  Procedure(s): HEMORRHOIDECTOMY (N/A) RECTAL EXAM UNDER ANESTHESIA (N/A) as a surgical intervention .  The patient's history has been reviewed, patient examined, no change in status, stable for surgery.  I have reviewed the patient's chart and labs.  Questions were answered to the patient's satisfaction.  He was seen in the preop area.  He says that the bulging and hemorrhoids have improved with the conservative management.  Risks of the procedure again discussed in lay terms.  We discussed the risks of infection, bleeding, pain, scarring, stricture, recurrence, and incontinence and he expressed understanding and desires to proceed with rectal EUA and hemorrhoidectomy.   Lodema Pilot DAVID

## 2012-11-24 ENCOUNTER — Encounter (HOSPITAL_COMMUNITY): Payer: Self-pay | Admitting: General Surgery

## 2012-11-24 NOTE — Anesthesia Postprocedure Evaluation (Signed)
  Anesthesia Post-op Note  Patient: Jackson Sherman  Procedure(s) Performed: Procedure(s) (LRB): HEMORRHOIDECTOMY (N/A) RECTAL EXAM UNDER ANESTHESIA (N/A)  Patient Location: PACU  Anesthesia Type: General  Level of Consciousness: awake and alert   Airway and Oxygen Therapy: Patient Spontanous Breathing  Post-op Pain: mild  Post-op Assessment: Post-op Vital signs reviewed, Patient's Cardiovascular Status Stable, Respiratory Function Stable, Patent Airway and No signs of Nausea or vomiting  Last Vitals:  Filed Vitals:   11/23/12 1541  BP: 143/87  Pulse: 66  Temp: 37.2 C  Resp: 14    Post-op Vital Signs: stable   Complications: No apparent anesthesia complications

## 2012-11-24 NOTE — Op Note (Signed)
NAME:  Jackson Sherman, Jackson Sherman             ACCOUNT NO.:  [id number]  MEDICAL RECORD NO.:  [id number]  LOCATION:  [facility]                         FACILITY:  [facility]  PHYSICIAN:  Lodema Pilot, MD       DATE OF BIRTH:  08/19/1960  DATE OF PROCEDURE:  11/23/2012 DATE OF DISCHARGE:  11/23/2012                              OPERATIVE REPORT   PROCEDURE:  Rectal exam under anesthesia with open hemorrhoidectomy.  PREOPERATIVE DIAGNOSIS:  Hemorrhoids.  POSTOPERATIVE DIAGNOSIS:  Hemorrhoids.  SURGEON:  Lodema Pilot, MD  ASSISTANT:  None.  ANESTHESIA:  General endotracheal tube anesthesia with 20 mL of Exparel injected locally.  SPECIMENS:  Right posterior hemorrhoidectomy sent to Pathology for permanent section.  COMPLICATIONS:  None apparent.  FINDINGS:  Large thrombosed hemorrhoids at the right posterior column, another notable at right anterior column hemorrhoidectomy.  INDICATION FOR PROCEDURE:  Jackson Sherman is a 53 year old male with prior episode of thrombosed hemorrhoids in 2011 which required incision and drainage.  He had been okay until recently when he had another episode of thrombosis with a larger bulge at the right posterior column.  Since then, he has had persistent bulging of his hemorrhoids.  They also have been causing significant discomfort requiring pain medication.  OPERATIVE DETAILS:  Jackson Sherman was seen and evaluated in the preoperative area and risks and benefits of the procedure were discussed in lay terms.  Informed consent was obtained and was given prophylactic antibiotics and enemas performed in the preoperative area.  Risks and benefits of the procedure were discussed and he was taken to the operating room, and general endotracheal tube anesthesia was obtained and he was placed on the table in a prone jack-knife position.  Buttocks were taped laterally and he had a large thrombosed hemorrhoid at the right posterior column as well as some another internal  hemorrhoid in the right anterior column.  Digital rectal exam reveals no obstructive mass or lesions.  The anal retractor was placed and he was noted only to have significant problems on the right side from about the 2 o'clock position to the 6 o'clock position on exam.  A suture was placed at the apex of the right posterior column which was the largest hemorrhoids and this was tagged for a later use.  I then marked out an elliptical area around the right posterior column hemorrhoid and excised this area with Bovie electrocautery, taking care to stay superficial to the sphincter complex.  He still had some hemorrhoidal tissue on the lateral right anterior column, so I took some additional tissue in this area using Bovie electrocautery and felt that this was approximated easily enough and was not at risk for a stricture.  The running locking 3-0 Vicryl suture was used to approximate the mucosa with the previously placed suture at the apex of the hemorrhoidal columns.  This was run to the external skin and a few millimeter of the external portion was left open for drainage, and the suture was run back proximally and sutured to itself.  The wound was then irrigated and hemostasis was noted to be adequate.  I inspected the area against and I felt this would be adequate for treatment of hemorrhoid  disease.  I again irrigated and the wound was noted to be hemostatic and I injected 4 quadrants around the anus 20 mL of Exparel anesthetic aspirating with each injection to ensure that there was no intravascular injection of the Exparel and a bupivacaine- coated Gelfoam roll was placed into the rectum and anal canal for added pain control and hemostasis.  All sponge, needle, and instrument counts were correct at the end of the case.  The patient tolerated the procedure well without apparent complication.          ______________________________ Lodema Pilot, MD     BL/MEDQ  D:  11/23/2012   T:  11/24/2012  Job:  [id number]

## 2012-11-25 ENCOUNTER — Telehealth (INDEPENDENT_AMBULATORY_CARE_PROVIDER_SITE_OTHER): Payer: Self-pay

## 2012-11-25 NOTE — Telephone Encounter (Signed)
Message copied by Maryan Puls on Fri Nov 25, 2012  2:42 PM ------      Message from: Louie Casa      Created: Thu Nov 24, 2012 10:16 AM      Regarding: Schd 1st po      Contact: [phone]       Patient had Hemorrhoidectomy yesterday needs 3 wks appointment, Please call him. ------

## 2012-11-25 NOTE — Telephone Encounter (Signed)
Called patient with post op appointment date & time for 12/09/12 @ 10:30 am w/Dr. Biagio Quint.

## 2012-11-28 ENCOUNTER — Other Ambulatory Visit (INDEPENDENT_AMBULATORY_CARE_PROVIDER_SITE_OTHER): Payer: Self-pay | Admitting: General Surgery

## 2012-12-01 ENCOUNTER — Telehealth (INDEPENDENT_AMBULATORY_CARE_PROVIDER_SITE_OTHER): Payer: Self-pay

## 2012-12-01 NOTE — Telephone Encounter (Signed)
Pt calling for refill of his pain medication.  Norco 5/325 #30 w/ no refills called to [facility] in [place].  Pt is aware.

## 2012-12-09 ENCOUNTER — Encounter (INDEPENDENT_AMBULATORY_CARE_PROVIDER_SITE_OTHER): Payer: Self-pay | Admitting: General Surgery

## 2012-12-09 ENCOUNTER — Ambulatory Visit (INDEPENDENT_AMBULATORY_CARE_PROVIDER_SITE_OTHER): Payer: Commercial Indemnity | Admitting: General Surgery

## 2012-12-09 VITALS — BP 116/86 | HR 68 | Temp 97.8°F | Resp 16 | Ht 70.0 in | Wt 234.0 lb

## 2012-12-09 DIAGNOSIS — Z4889 Encounter for other specified surgical aftercare: Secondary | ICD-10-CM

## 2012-12-09 DIAGNOSIS — Z5189 Encounter for other specified aftercare: Secondary | ICD-10-CM

## 2012-12-09 MED ORDER — HYDROCODONE-ACETAMINOPHEN 5-325 MG PO TABS: 1.0000 | ORAL_TABLET | Freq: Four times a day (QID) | ORAL | Status: DC | PRN

## 2012-12-09 NOTE — Progress Notes (Signed)
Subjective:     Patient ID: RAGNAR WAAS, male   DOB: September 24, 1960, 52 y.o.   MRN: [id number]  HPI This patient also status post open hemorrhoidectomy. He says that he is doing very well and he feels much better than preoperatively. He is moving his bowels fairly regularly and is taking is Colace, mineral oil, and fiber. He takes a pain medicine before he moves his bowels but otherwise is taking much less of the pain medication and he says he feels much better. He has not had any bleeding.  Review of Systems     Objective:   Physical Exam His wound is healing up very nicely without any evidence of active bleeding.. There is no evidence of any postoperative complication. His sutures are still intact.    Assessment:     Status post open hemorrhoidectomy-doing well He is doing very well there is no evidence of any postoperative complications. I recommend that he continue with his bowel regimen and stool softeners and fiber supplements and we'll see him back in about 4 weeks for repeat evaluation. I did give him another prescription for pain medication and this should be last that he should require     Plan:     Continue with bowel regimen and will follow up with him in 4 weeks or sooner as needed

## 2013-01-06 ENCOUNTER — Encounter (INDEPENDENT_AMBULATORY_CARE_PROVIDER_SITE_OTHER): Payer: Commercial Indemnity | Admitting: General Surgery

## 2013-03-01 ENCOUNTER — Ambulatory Visit (INDEPENDENT_AMBULATORY_CARE_PROVIDER_SITE_OTHER): Payer: Commercial Indemnity | Admitting: Physician Assistant

## 2013-03-01 ENCOUNTER — Encounter: Payer: Self-pay | Admitting: Physician Assistant

## 2013-03-01 VITALS — BP 127/86 | HR 72 | Wt 238.0 lb

## 2013-03-01 DIAGNOSIS — M62838 Other muscle spasm: Secondary | ICD-10-CM

## 2013-03-01 DIAGNOSIS — M545 Low back pain: Secondary | ICD-10-CM

## 2013-03-01 MED ORDER — IBUPROFEN 800 MG PO TABS: 800.0000 mg | ORAL_TABLET | Freq: Three times a day (TID) | ORAL | Status: DC | PRN

## 2013-03-01 MED ORDER — CYCLOBENZAPRINE HCL 10 MG PO TABS: 10.0000 mg | ORAL_TABLET | Freq: Three times a day (TID) | ORAL | Status: DC | PRN

## 2013-03-01 MED ORDER — KETOROLAC TROMETHAMINE 60 MG/2ML IM SOLN
60.0000 mg | Freq: Once | INTRAMUSCULAR | Status: AC
  Administered 2013-03-01: 60 mg via INTRAMUSCULAR

## 2013-03-01 NOTE — Patient Instructions (Addendum)
Flexeril as needed for muscle spasm. Heat multipile times a day. Ibuprofen regularly for next couple of days then only as needed.   Low Back Strain with Rehab A strain is an injury in which a tendon or muscle is torn. The muscles and tendons of the lower back are vulnerable to strains. However, these muscles and tendons are very strong and require a great force to be injured. Strains are classified into three categories. Grade 1 strains cause pain, but the tendon is not lengthened. Grade 2 strains include a lengthened ligament, due to the ligament being stretched or partially ruptured. With grade 2 strains there is still function, although the function may be decreased. Grade 3 strains involve a complete tear of the tendon or muscle, and function is usually impaired. SYMPTOMS   Pain in the lower back.  Pain that affects one side more than the other.  Pain that gets worse with movement and may be felt in the hip, buttocks, or back of the thigh.  Muscle spasms of the muscles in the back.  Swelling along the muscles of the back.  Loss of strength of the back muscles.  Crackling sound (crepitation) when the muscles are touched. CAUSES  Lower back strains occur when a force is placed on the muscles or tendons that is greater than they can handle. Common causes of injury include:  Prolonged overuse of the muscle-tendon units in the lower back, usually from incorrect posture.  A single violent injury or force applied to the back. RISK INCREASES WITH:  Sports that involve twisting forces on the spine or a lot of bending at the waist (football, rugby, weightlifting, bowling, golf, tennis, speed skating, racquetball, swimming, running, gymnastics, diving).  Poor strength and flexibility.  Failure to warm up properly before activity.  Family history of lower back pain or disk disorders.  Previous back injury or surgery (especially fusion).  Poor posture with lifting, especially heavy  objects.  Prolonged sitting, especially with poor posture. PREVENTION   Learn and use proper posture when sitting or lifting (maintain proper posture when sitting, lift using the knees and legs, not at the waist).  Warm up and stretch properly before activity.  Allow for adequate recovery between workouts.  Maintain physical fitness:  Strength, flexibility, and endurance.  Cardiovascular fitness. PROGNOSIS  If treated properly, lower back strains usually heal within 6 weeks. RELATED COMPLICATIONS   Recurring symptoms, resulting in a chronic problem.  Chronic inflammation, scarring, and partial muscle-tendon tear.  Delayed healing or resolution of symptoms.  Prolonged disability. TREATMENT  Treatment first involves the use of ice and medicine, to reduce pain and inflammation. The use of strengthening and stretching exercises may help reduce pain with activity. These exercises may be performed at home or with a therapist. Severe injuries may require referral to a therapist for further evaluation and treatment, such as ultrasound. Your caregiver may advise that you wear a back brace or corset, to help reduce pain and discomfort. Often, prolonged bed rest results in greater harm then benefit. Corticosteroid injections may be recommended. However, these should be reserved for the most serious cases. It is important to avoid using your back when lifting objects. At night, sleep on your back on a firm mattress with a pillow placed under your knees. If non-surgical treatment is unsuccessful, surgery may be needed.  MEDICATION   If pain medicine is needed, nonsteroidal anti-inflammatory medicines (aspirin and ibuprofen), or other minor pain relievers (acetaminophen), are often advised.  Do not take  pain medicine for 7 days before surgery.  Prescription pain relievers may be given, if your caregiver thinks they are needed. Use only as directed and only as much as you need.  Ointments  applied to the skin may be helpful.  Corticosteroid injections may be given by your caregiver. These injections should be reserved for the most serious cases, because they may only be given a certain number of times. HEAT AND COLD  Cold treatment (icing) should be applied for 10 to 15 minutes every 2 to 3 hours for inflammation and pain, and immediately after activity that aggravates your symptoms. Use ice packs or an ice massage.  Heat treatment may be used before performing stretching and strengthening activities prescribed by your caregiver, physical therapist, or athletic trainer. Use a heat pack or a warm water soak. SEEK MEDICAL CARE IF:   Symptoms get worse or do not improve in 2 to 4 weeks, despite treatment.  You develop numbness, weakness, or loss of bowel or bladder function.  New, unexplained symptoms develop. (Drugs used in treatment may produce side effects.) EXERCISES  RANGE OF MOTION (ROM) AND STRETCHING EXERCISES - Low Back Strain Most people with lower back pain will find that their symptoms get worse with excessive bending forward (flexion) or arching at the lower back (extension). The exercises which will help resolve your symptoms will focus on the opposite motion.  Your physician, physical therapist or athletic trainer will help you determine which exercises will be most helpful to resolve your lower back pain. Do not complete any exercises without first consulting with your caregiver. Discontinue any exercises which make your symptoms worse until you speak to your caregiver.  If you have pain, numbness or tingling which travels down into your buttocks, leg or foot, the goal of the therapy is for these symptoms to move closer to your back and eventually resolve. Sometimes, these leg symptoms will get better, but your lower back pain may worsen. This is typically an indication of progress in your rehabilitation. Be very alert to any changes in your symptoms and the activities  in which you participated in the 24 hours prior to the change. Sharing this information with your caregiver will allow him/her to most efficiently treat your condition.  These exercises may help you when beginning to rehabilitate your injury. Your symptoms may resolve with or without further involvement from your physician, physical therapist or athletic trainer. While completing these exercises, remember:  Restoring tissue flexibility helps normal motion to return to the joints. This allows healthier, less painful movement and activity.  An effective stretch should be held for at least 30 seconds.  A stretch should never be painful. You should only feel a gentle lengthening or release in the stretched tissue. FLEXION RANGE OF MOTION AND STRETCHING EXERCISES: STRETCH  Flexion, Single Knee to Chest   Lie on a firm bed or floor with both legs extended in front of you.  Keeping one leg in contact with the floor, bring your opposite knee to your chest. Hold your leg in place by either grabbing behind your thigh or at your knee.  Pull until you feel a gentle stretch in your lower back. Hold __________ seconds.  Slowly release your grasp and repeat the exercise with the opposite side. Repeat __________ times. Complete this exercise __________ times per day.  STRETCH  Flexion, Double Knee to Chest   Lie on a firm bed or floor with both legs extended in front of you.  Keeping one  leg in contact with the floor, bring your opposite knee to your chest.  Tense your stomach muscles to support your back and then lift your other knee to your chest. Hold your legs in place by either grabbing behind your thighs or at your knees.  Pull both knees toward your chest until you feel a gentle stretch in your lower back. Hold __________ seconds.  Tense your stomach muscles and slowly return one leg at a time to the floor. Repeat __________ times. Complete this exercise __________ times per day.  STRETCH  Low  Trunk Rotation  Lie on a firm bed or floor. Keeping your legs in front of you, bend your knees so they are both pointed toward the ceiling and your feet are flat on the floor.  Extend your arms out to the side. This will stabilize your upper body by keeping your shoulders in contact with the floor.  Gently and slowly drop both knees together to one side until you feel a gentle stretch in your lower back. Hold for __________ seconds.  Tense your stomach muscles to support your lower back as you bring your knees back to the starting position. Repeat the exercise to the other side. Repeat __________ times. Complete this exercise __________ times per day  EXTENSION RANGE OF MOTION AND FLEXIBILITY EXERCISES: STRETCH  Extension, Prone on Elbows   Lie on your stomach on the floor, a bed will be too soft. Place your palms about shoulder width apart and at the height of your head.  Place your elbows under your shoulders. If this is too painful, stack pillows under your chest.  Allow your body to relax so that your hips drop lower and make contact more completely with the floor.  Hold this position for __________ seconds.  Slowly return to lying flat on the floor. Repeat __________ times. Complete this exercise __________ times per day.  RANGE OF MOTION  Extension, Prone Press Ups  Lie on your stomach on the floor, a bed will be too soft. Place your palms about shoulder width apart and at the height of your head.  Keeping your back as relaxed as possible, slowly straighten your elbows while keeping your hips on the floor. You may adjust the placement of your hands to maximize your comfort. As you gain motion, your hands will come more underneath your shoulders.  Hold this position __________ seconds.  Slowly return to lying flat on the floor. Repeat __________ times. Complete this exercise __________ times per day.  RANGE OF MOTION- Quadruped, Neutral Spine   Assume a hands and knees  position on a firm surface. Keep your hands under your shoulders and your knees under your hips. You may place padding under your knees for comfort.  Drop your head and point your tail bone toward the ground below you. This will round out your lower back like an angry cat. Hold this position for __________ seconds.  Slowly lift your head and release your tail bone so that your back sags into a large arch, like an old horse.  Hold this position for __________ seconds.  Repeat this until you feel limber in your lower back.  Now, find your "sweet spot." This will be the most comfortable position somewhere between the two previous positions. This is your neutral spine. Once you have found this position, tense your stomach muscles to support your lower back.  Hold this position for __________ seconds. Repeat __________ times. Complete this exercise __________ times per day.  STRENGTHENING  EXERCISES - Low Back Strain These exercises may help you when beginning to rehabilitate your injury. These exercises should be done near your "sweet spot." This is the neutral, low-back arch, somewhere between fully rounded and fully arched, that is your least painful position. When performed in this safe range of motion, these exercises can be used for people who have either a flexion or extension based injury. These exercises may resolve your symptoms with or without further involvement from your physician, physical therapist or athletic trainer. While completing these exercises, remember:   Muscles can gain both the endurance and the strength needed for everyday activities through controlled exercises.  Complete these exercises as instructed by your physician, physical therapist or athletic trainer. Increase the resistance and repetitions only as guided.  You may experience muscle soreness or fatigue, but the pain or discomfort you are trying to eliminate should never worsen during these exercises. If this pain  does worsen, stop and make certain you are following the directions exactly. If the pain is still present after adjustments, discontinue the exercise until you can discuss the trouble with your caregiver. STRENGTHENING Deep Abdominals, Pelvic Tilt  Lie on a firm bed or floor. Keeping your legs in front of you, bend your knees so they are both pointed toward the ceiling and your feet are flat on the floor.  Tense your lower abdominal muscles to press your lower back into the floor. This motion will rotate your pelvis so that your tail bone is scooping upwards rather than pointing at your feet or into the floor.  With a gentle tension and even breathing, hold this position for __________ seconds. Repeat __________ times. Complete this exercise __________ times per day.  STRENGTHENING  Abdominals, Crunches   Lie on a firm bed or floor. Keeping your legs in front of you, bend your knees so they are both pointed toward the ceiling and your feet are flat on the floor. Cross your arms over your chest.  Slightly tip your chin down without bending your neck.  Tense your abdominals and slowly lift your trunk high enough to just clear your shoulder blades. Lifting higher can put excessive stress on the lower back and does not further strengthen your abdominal muscles.  Control your return to the starting position. Repeat __________ times. Complete this exercise __________ times per day.  STRENGTHENING  Quadruped, Opposite UE/LE Lift   Assume a hands and knees position on a firm surface. Keep your hands under your shoulders and your knees under your hips. You may place padding under your knees for comfort.  Find your neutral spine and gently tense your abdominal muscles so that you can maintain this position. Your shoulders and hips should form a rectangle that is parallel with the floor and is not twisted.  Keeping your trunk steady, lift your right hand no higher than your shoulder and then your left  leg no higher than your hip. Make sure you are not holding your breath. Hold this position __________ seconds.  Continuing to keep your abdominal muscles tense and your back steady, slowly return to your starting position. Repeat with the opposite arm and leg. Repeat __________ times. Complete this exercise __________ times per day.  STRENGTHENING  Lower Abdominals, Double Knee Lift  Lie on a firm bed or floor. Keeping your legs in front of you, bend your knees so they are both pointed toward the ceiling and your feet are flat on the floor.  Tense your abdominal muscles to brace  your lower back and slowly lift both of your knees until they come over your hips. Be certain not to hold your breath.  Hold __________ seconds. Using your abdominal muscles, return to the starting position in a slow and controlled manner. Repeat __________ times. Complete this exercise __________ times per day.  POSTURE AND BODY MECHANICS CONSIDERATIONS - Low Back Strain Keeping correct posture when sitting, standing or completing your activities will reduce the stress put on different body tissues, allowing injured tissues a chance to heal and limiting painful experiences. The following are general guidelines for improved posture. Your physician or physical therapist will provide you with any instructions specific to your needs. While reading these guidelines, remember:  The exercises prescribed by your provider will help you have the flexibility and strength to maintain correct postures.  The correct posture provides the best environment for your joints to work. All of your joints have less wear and tear when properly supported by a spine with good posture. This means you will experience a healthier, less painful body.  Correct posture must be practiced with all of your activities, especially prolonged sitting and standing. Correct posture is as important when doing repetitive low-stress activities (typing) as it is  when doing a single heavy-load activity (lifting). RESTING POSITIONS Consider which positions are most painful for you when choosing a resting position. If you have pain with flexion-based activities (sitting, bending, stooping, squatting), choose a position that allows you to rest in a less flexed posture. You would want to avoid curling into a fetal position on your side. If your pain worsens with extension-based activities (prolonged standing, working overhead), avoid resting in an extended position such as sleeping on your stomach. Most people will find more comfort when they rest with their spine in a more neutral position, neither too rounded nor too arched. Lying on a non-sagging bed on your side with a pillow between your knees, or on your back with a pillow under your knees will often provide some relief. Keep in mind, being in any one position for a prolonged period of time, no matter how correct your posture, can still lead to stiffness. PROPER SITTING POSTURE In order to minimize stress and discomfort on your spine, you must sit with correct posture. Sitting with good posture should be effortless for a healthy body. Returning to good posture is a gradual process. Many people can work toward this most comfortably by using various supports until they have the flexibility and strength to maintain this posture on their own. When sitting with proper posture, your ears will fall over your shoulders and your shoulders will fall over your hips. You should use the back of the chair to support your upper back. Your lower back will be in a neutral position, just slightly arched. You may place a small pillow or folded towel at the base of your lower back for support.  When working at a desk, create an environment that supports good, upright posture. Without extra support, muscles tire, which leads to excessive strain on joints and other tissues. Keep these recommendations in mind: CHAIR:  A chair should be  able to slide under your desk when your back makes contact with the back of the chair. This allows you to work closely.  The chair's height should allow your eyes to be level with the upper part of your monitor and your hands to be slightly lower than your elbows. BODY POSITION  Your feet should make contact with the floor.  If this is not possible, use a foot rest.  Keep your ears over your shoulders. This will reduce stress on your neck and lower back. INCORRECT SITTING POSTURES  If you are feeling tired and unable to assume a healthy sitting posture, do not slouch or slump. This puts excessive strain on your back tissues, causing more damage and pain. Healthier options include:  Using more support, like a lumbar pillow.  Switching tasks to something that requires you to be upright or walking.  Talking a brief walk.  Lying down to rest in a neutral-spine position. PROLONGED STANDING WHILE SLIGHTLY LEANING FORWARD  When completing a task that requires you to lean forward while standing in one place for a long time, place either foot up on a stationary 2-4 inch high object to help maintain the best posture. When both feet are on the ground, the lower back tends to lose its slight inward curve. If this curve flattens (or becomes too large), then the back and your other joints will experience too much stress, tire more quickly, and can cause pain. CORRECT STANDING POSTURES Proper standing posture should be assumed with all daily activities, even if they only take a few moments, like when brushing your teeth. As in sitting, your ears should fall over your shoulders and your shoulders should fall over your hips. You should keep a slight tension in your abdominal muscles to brace your spine. Your tailbone should point down to the ground, not behind your body, resulting in an over-extended swayback posture.  INCORRECT STANDING POSTURES  Common incorrect standing postures include a forward head,  locked knees and/or an excessive swayback. WALKING Walk with an upright posture. Your ears, shoulders and hips should all line-up. PROLONGED ACTIVITY IN A FLEXED POSITION When completing a task that requires you to bend forward at your waist or lean over a low surface, try to find a way to stabilize 3 out of 4 of your limbs. You can place a hand or elbow on your thigh or rest a knee on the surface you are reaching across. This will provide you more stability so that your muscles do not fatigue as quickly. By keeping your knees relaxed, or slightly bent, you will also reduce stress across your lower back. CORRECT LIFTING TECHNIQUES DO :   Assume a wide stance. This will provide you more stability and the opportunity to get as close as possible to the object which you are lifting.  Tense your abdominals to brace your spine. Bend at the knees and hips. Keeping your back locked in a neutral-spine position, lift using your leg muscles. Lift with your legs, keeping your back straight.  Test the weight of unknown objects before attempting to lift them.  Try to keep your elbows locked down at your sides in order get the best strength from your shoulders when carrying an object.  Always ask for help when lifting heavy or awkward objects. INCORRECT LIFTING TECHNIQUES DO NOT:   Lock your knees when lifting, even if it is a small object.  Bend and twist. Pivot at your feet or move your feet when needing to change directions.  Assume that you can safely pick up even a paper clip without proper posture. Document Released: 06/15/2005 Document Revised: 09/07/2011 Document Reviewed: 09/27/2008 [facility] Patient Information 2014 [place], [place].

## 2013-03-01 NOTE — Progress Notes (Signed)
  Subjective:    Patient ID: Jackson Sherman, male    DOB: [birth year], 52 y.o.   MRN: [id number]  HPI Patient is a 52 yo male who presents to the clinic with low to mid back pain. He denies any previous back injuries or recent trauma. He has been very active this weekend. He biked and hiked for 3 days this weekend. Yesterday after riding he sat down for lunch and when he tried to get up there was intense pain in his lower back mostly on right side both on both sides. Denies any radiation down into his legs. Worse with any side to side or extension or flexion. He took wifes flexeril last night and helped a lot. He is able to walk without difficultly. He did take one advil seemed to help a little big. Denies any saddle anthesia or bladder/bowel dysfunction.      Review of Systems     Objective:   Physical Exam  Musculoskeletal:  ROM limited at waist in all directions due to pain. Negative straight leg test. Strength 5/5 bilateral legs. Patellar reflexes 2+ and symmetric. Sensation to dull and light negative. No pain over spine. paraspinus muscles tight to palpation over left and right side low back area.           Assessment & Plan:  Muscle spasms/low back sprain- Toradol 60mg  IM given today. Flexeril as needed up to 3x a day. Ibuprofen as needed up to 3x a day. Low back stretches given to start daily. Heat was encouraged. Do not ride back until ROM is full and without pain.

## 2013-03-01 NOTE — Addendum Note (Signed)
Addended by: Donne Anon L on: 03/01/2013 03:00 PM   Modules accepted: Orders

## 2013-05-04 ENCOUNTER — Other Ambulatory Visit: Payer: Self-pay

## 2013-05-08 ENCOUNTER — Other Ambulatory Visit: Payer: Self-pay | Admitting: *Deleted

## 2013-05-08 ENCOUNTER — Other Ambulatory Visit: Payer: Self-pay | Admitting: Family Medicine

## 2013-05-08 MED ORDER — QUETIAPINE FUMARATE 50 MG PO TABS: 50.0000 mg | ORAL_TABLET | Freq: Every evening | ORAL | Status: DC | PRN

## 2013-05-11 ENCOUNTER — Other Ambulatory Visit: Payer: Self-pay | Admitting: Family Medicine

## 2013-05-22 ENCOUNTER — Other Ambulatory Visit: Payer: Self-pay

## 2013-05-22 MED ORDER — ATORVASTATIN CALCIUM 20 MG PO TABS: ORAL_TABLET | ORAL | Status: DC

## 2013-05-23 ENCOUNTER — Ambulatory Visit (INDEPENDENT_AMBULATORY_CARE_PROVIDER_SITE_OTHER): Payer: Commercial Indemnity | Admitting: Family Medicine

## 2013-05-23 ENCOUNTER — Encounter: Payer: Self-pay | Admitting: Family Medicine

## 2013-05-23 VITALS — BP 126/81 | HR 61 | Temp 98.2°F | Ht 70.0 in | Wt 228.0 lb

## 2013-05-23 DIAGNOSIS — Z23 Encounter for immunization: Secondary | ICD-10-CM

## 2013-05-23 DIAGNOSIS — I10 Essential (primary) hypertension: Secondary | ICD-10-CM

## 2013-05-23 DIAGNOSIS — R0609 Other forms of dyspnea: Secondary | ICD-10-CM

## 2013-05-23 DIAGNOSIS — R0989 Other specified symptoms and signs involving the circulatory and respiratory systems: Secondary | ICD-10-CM

## 2013-05-23 DIAGNOSIS — R0683 Snoring: Secondary | ICD-10-CM

## 2013-05-23 DIAGNOSIS — E785 Hyperlipidemia, unspecified: Secondary | ICD-10-CM

## 2013-05-23 MED ORDER — LISINOPRIL 20 MG PO TABS: 20.0000 mg | ORAL_TABLET | Freq: Every day | ORAL | Status: DC

## 2013-05-23 MED ORDER — QUETIAPINE FUMARATE 50 MG PO TABS: 50.0000 mg | ORAL_TABLET | Freq: Every evening | ORAL | Status: DC | PRN

## 2013-05-23 MED ORDER — QUETIAPINE FUMARATE 100 MG PO TABS: 100.0000 mg | ORAL_TABLET | Freq: Every evening | ORAL | Status: DC | PRN

## 2013-05-23 NOTE — Progress Notes (Signed)
  Subjective:    Patient ID: Jackson Sherman, male    DOB: [birth year], 52 y.o.   MRN: [id number]  HPI HTN -  Pt denies chest pain, SOB, dizziness, or heart palpitations.  Taking meds as directed w/o problems.  Denies medication side effects.  Started running for exercise about 1.5 months ago.    Hyperlipidemia - well controlled.  Out of meds for a coupel of days. Not fasting today.  Lab Results  Component Value Date   CHOL 171 03/07/2012   HDL 40 03/07/2012   LDLCALC 147 03/07/2012   TRIG 71 03/07/2012   CHOLHDL 3.7 Ratio 05/06/2010     Screen positive for sleep apnea. Was going to get tested about 3-4 yr ago but his insurance wouldn't cover it.    Review of Systems     Objective:   Physical Exam  Constitutional: He is oriented to person, place, and time. He appears well-developed and well-nourished.  HENT:  Head: Normocephalic and atraumatic.  Neck: Neck supple. No thyromegaly present.  Cardiovascular: Normal rate, regular rhythm and normal heart sounds.   No carotid bruits  Pulmonary/Chest: Effort normal and breath sounds normal.  Lymphadenopathy:    He has no cervical adenopathy.  Neurological: He is alert and oriented to person, place, and time.  Skin: Skin is warm and dry.  Psychiatric: He has a normal mood and affect. His behavior is normal.          Assessment & Plan:  HTN- Well controlled. Continue current regimen. Followup in 6 months. Due for CMP.  Hyperlipidemia - refilled medications today. Due for lipid check as well as liver.  Snoring - will refer for screening for sleep apnea. He is met his appetite is up with his insurance this year. So we'll see if a split-night sleep study is covered or if he needs a in home test.  STOP BANG in system.   Encouraged him to reschedule his colonoscpy

## 2013-07-24 ENCOUNTER — Telehealth: Payer: Self-pay | Admitting: Family Medicine

## 2013-07-24 NOTE — Telephone Encounter (Signed)
Please call patient: We ordered a CMP and lipid panel on him back in September. We have not received the results yet. Please call him and encouraged him to go since he is able to. Make sure he is fasting. We also received reminder from his insurance company.

## 2013-07-25 NOTE — Telephone Encounter (Signed)
Left vm informing pt to go have labs done and he will need to fast. Told to call back should he have any questions.Audelia Hives [place]

## 2013-07-28 ENCOUNTER — Other Ambulatory Visit: Payer: Self-pay

## 2013-07-28 MED ORDER — HYDROCHLOROTHIAZIDE 12.5 MG PO CAPS: 12.5000 mg | ORAL_CAPSULE | Freq: Every day | ORAL | Status: DC

## 2013-08-07 ENCOUNTER — Telehealth: Payer: Self-pay

## 2013-08-07 ENCOUNTER — Other Ambulatory Visit: Payer: Self-pay

## 2013-12-18 ENCOUNTER — Telehealth: Payer: Self-pay | Admitting: Family Medicine

## 2013-12-18 NOTE — Telephone Encounter (Signed)
Called and left message informing pt to call and schedule a f/u appt for HTN and labs.[name]

## 2013-12-18 NOTE — Telephone Encounter (Signed)
Physical patient have him schedule a six-month followup for blood pressure. He was last seen in November. He also needs up-to-date blood work especially because of the medications that he is taking

## 2013-12-20 ENCOUNTER — Encounter: Payer: Self-pay | Admitting: Family Medicine

## 2013-12-20 ENCOUNTER — Ambulatory Visit (INDEPENDENT_AMBULATORY_CARE_PROVIDER_SITE_OTHER): Payer: Commercial Indemnity | Admitting: Family Medicine

## 2013-12-20 VITALS — BP 145/95 | HR 96 | Temp 97.1°F | Ht 70.0 in | Wt 241.0 lb

## 2013-12-20 DIAGNOSIS — J0110 Acute frontal sinusitis, unspecified: Secondary | ICD-10-CM

## 2013-12-20 DIAGNOSIS — E785 Hyperlipidemia, unspecified: Secondary | ICD-10-CM

## 2013-12-20 DIAGNOSIS — I10 Essential (primary) hypertension: Secondary | ICD-10-CM

## 2013-12-20 DIAGNOSIS — J209 Acute bronchitis, unspecified: Secondary | ICD-10-CM

## 2013-12-20 MED ORDER — AMOXICILLIN-POT CLAVULANATE 875-125 MG PO TABS: 1.0000 | ORAL_TABLET | Freq: Two times a day (BID) | ORAL | Status: DC

## 2013-12-20 MED ORDER — HYDROCODONE-HOMATROPINE 5-1.5 MG/5ML PO SYRP: 5.0000 mL | ORAL_SOLUTION | Freq: Every evening | ORAL | Status: DC | PRN

## 2013-12-20 NOTE — Progress Notes (Signed)
   Subjective:    Patient ID: Jackson Sherman, male    DOB: 10/08/1960, 53 y.o.   MRN: [id number]  HPI Cough x 10 days with green sputum. Mild St.  Has had HA for about 2 weeks and some sinus congestion.  Maybe low grade fever yesteday.  Using mucinex liquid.  Cough is keeping him awake at night.   Hypertension- Pt denies chest pain, SOB, dizziness, or heart palpitations.  Taking meds as directed w/o problems.  Denies medication side effects.    Hyperlipidemia-tolerating statin well without any myalgias or side effects. He says he is taking it consistently.  Review of Systems     Objective:   Physical Exam  Constitutional: He is oriented to person, place, and time. He appears well-developed and well-nourished.  HENT:  Head: Normocephalic and atraumatic.  Right Ear: External ear normal.  Left Ear: External ear normal.  Nose: Nose normal.  Mouth/Throat: Oropharynx is clear and moist.  TMs and canals are clear.   Eyes: Conjunctivae and EOM are normal. Pupils are equal, round, and reactive to light.  Neck: Neck supple. No thyromegaly present.  Cardiovascular: Normal rate and normal heart sounds.   Pulmonary/Chest: Effort normal and breath sounds normal.  Lymphadenopathy:    He has no cervical adenopathy.  Neurological: He is alert and oriented to person, place, and time.  Skin: Skin is warm and dry.  Psychiatric: He has a normal mood and affect.          Assessment & Plan:  Acute sinusitis/bronchitis - since she's had symptoms for 10 days we'll go ahead and treat with Augmentin. Also given a prescription for cough syrup to use at bedtime. Continue symptomatic care. If not significantly better in one week then please give [place] a call.  HTN- uncontrolled today but he has not been sleeping well for several nights the [place]. He has been compliant with his medications. He plans on coming back in next month for physical so will recheck his blood pressure at that time. He is due for CMP  and a lipid panel so I have given a lab slip today and encouraged him to go since possible.  Hyperlipidemia - tolerating statin well. Due to repeat lipid panel.

## 2014-03-27 LAB — LIPID PANEL
CHOL/HDL RATIO: 3.4 ratio
CHOLESTEROL: 152 mg/dL (ref 0–200)
HDL: 45 mg/dL (ref >39)
LDL Cholesterol: 86 mg/dL (ref 0–99)
Triglycerides: 106 mg/dL (ref <150)
VLDL: 21 mg/dL (ref 0–40)

## 2014-03-27 LAB — COMPLETE METABOLIC PANEL WITH GFR
ALBUMIN: 4 g/dL (ref 3.5–5.2)
ALK PHOS: 67 U/L (ref 39–117)
ALT: 31 U/L (ref 0–53)
AST: 21 U/L (ref 0–37)
BUN: 17 mg/dL (ref 6–23)
CALCIUM: 9 mg/dL (ref 8.4–10.5)
CO2: 26 meq/L (ref 19–32)
Chloride: 104 mEq/L (ref 96–112)
Creat: 0.98 mg/dL (ref 0.50–1.35)
GFR, EST NON AFRICAN AMERICAN: 87 mL/min
GLUCOSE: 88 mg/dL (ref 70–99)
POTASSIUM: 4.7 meq/L (ref 3.5–5.3)
Sodium: 139 mEq/L (ref 135–145)
Total Bilirubin: 0.6 mg/dL (ref 0.2–1.2)
Total Protein: 6.7 g/dL (ref 6.0–8.3)

## 2014-03-27 NOTE — Progress Notes (Signed)
Quick Note:  All labs are normal. ______ 

## 2014-04-03 ENCOUNTER — Other Ambulatory Visit: Payer: Self-pay | Admitting: Family Medicine

## 2014-07-31 ENCOUNTER — Other Ambulatory Visit: Payer: Self-pay | Admitting: Family Medicine

## 2014-08-01 ENCOUNTER — Telehealth: Payer: Self-pay | Admitting: Emergency Medicine

## 2014-08-01 NOTE — Telephone Encounter (Signed)
appt made for 08/08/14

## 2014-08-01 NOTE — Telephone Encounter (Signed)
Patient called because refill for Quetiapine has been denied; also wishes to re-start Lexapro. He will make appt. with Dr. Madilyn Fireman and discuss underlying conditions.

## 2014-08-08 ENCOUNTER — Encounter: Payer: Self-pay | Admitting: Family Medicine

## 2014-08-08 ENCOUNTER — Ambulatory Visit (INDEPENDENT_AMBULATORY_CARE_PROVIDER_SITE_OTHER): Payer: Commercial Indemnity | Admitting: Family Medicine

## 2014-08-08 VITALS — BP 132/85 | HR 90 | Ht 70.0 in | Wt 254.0 lb

## 2014-08-08 DIAGNOSIS — G4700 Insomnia, unspecified: Secondary | ICD-10-CM

## 2014-08-08 DIAGNOSIS — F430 Acute stress reaction: Secondary | ICD-10-CM

## 2014-08-08 DIAGNOSIS — I10 Essential (primary) hypertension: Secondary | ICD-10-CM

## 2014-08-08 MED ORDER — LISINOPRIL 20 MG PO TABS: ORAL_TABLET | ORAL | Status: DC

## 2014-08-08 MED ORDER — TRAZODONE HCL 50 MG PO TABS
25.0000 mg | ORAL_TABLET | Freq: Every evening | ORAL | Status: DC | PRN
Start: 2014-08-08 — End: 2015-02-06

## 2014-08-08 MED ORDER — ESCITALOPRAM OXALATE 10 MG PO TABS
10.0000 mg | ORAL_TABLET | Freq: Every day | ORAL | Status: DC
Start: 2014-08-08 — End: 2015-05-28

## 2014-08-08 MED ORDER — QUETIAPINE FUMARATE 100 MG PO TABS: 100.0000 mg | ORAL_TABLET | Freq: Every evening | ORAL | Status: DC | PRN

## 2014-08-08 NOTE — Progress Notes (Signed)
   Subjective:    Patient ID: Jackson Sherman, male    DOB: [birth year], 54 y.o.   MRN: [id number]  HPI Hypertension- Pt denies chest pain, SOB, dizziness, or heart palpitations.  Taking meds as directed w/o problems.  Denies medication side effects.    Insomnia  - Uses seroqule PRN for sleep.  He will sometimes use for up to a week and then may not use it for up to 2 weeks.   Says thinks may need to be back on the lexapro. His wife says he has been more irritable and thinks he should restart it. Has been under more stress with ill mother recently. .  Did well iwht it before.    Review of Systems     Objective:   Physical Exam  Constitutional: He is oriented to person, place, and time. He appears well-developed and well-nourished.  HENT:  Head: Normocephalic and atraumatic.  Cardiovascular: Normal rate, regular rhythm and normal heart sounds.   Pulmonary/Chest: Effort normal and breath sounds normal.  Neurological: He is alert and oriented to person, place, and time.  Skin: Skin is warm and dry.  Psychiatric: He has a normal mood and affect. His behavior is normal.          Assessment & Plan:  HTN -  well controlled. Continue current regimen. Due for labs in March for lab slip printed and provided today and encouraged him to go at that time. Otherwise I will see him back in 6 months.  Insomnia - discussed getting him off of Seroquel which can increase his risk of diabetes over time. Will try trazodone instead which he has never used before. Start with half a tab and can increase up to 2 tabs if needed. Make sure to take an hour before bedtime and may need tendon head even further. If not successful then consider one of the other sleep aids such as Ambien. That he does have a history of alcohol abuse I want to be very careful with potentially addictive medications.   Acute situational stress-we'll go ahead and restart Lexapro. Follow up in 6 months. He's taken before and tolerated  it very well.

## 2014-09-02 ENCOUNTER — Telehealth: Payer: Self-pay | Admitting: Family

## 2014-09-02 DIAGNOSIS — R05 Cough: Secondary | ICD-10-CM

## 2014-09-02 DIAGNOSIS — R059 Cough, unspecified: Secondary | ICD-10-CM

## 2014-09-02 DIAGNOSIS — J069 Acute upper respiratory infection, unspecified: Secondary | ICD-10-CM

## 2014-09-02 MED ORDER — AZITHROMYCIN 250 MG PO TABS: ORAL_TABLET | ORAL | Status: DC

## 2014-09-02 MED ORDER — BENZONATATE 200 MG PO CAPS: 200.0000 mg | ORAL_CAPSULE | Freq: Three times a day (TID) | ORAL | Status: DC | PRN

## 2014-09-02 NOTE — Progress Notes (Signed)
We are sorry that you are not feeling well.  Here is how we plan to help!  Based on what you have shared with me it looks like you have upper respiratory tract inflammation that has resulted in a significant cough.  Inflammation and infection in the upper respiratory tract is commonly called bronchitis and has four common causes:  Allergies, Viral Infections, Acid Reflux and Bacterial Infections.  Allergies, viruses and acid reflux are treated by controlling symptoms or eliminating the cause. An example might be a cough caused by taking certain blood pressure medications. You stop the cough by changing the medication. Another example might be a cough caused by acid reflux. Controlling the reflux helps control the cough.  Based on your presentation I believe you most likely have A cough due to bacteria.  When patients have a fever and a productive cough with a change in color or increased sputum production, we are concerned about bacterial bronchitis.  If left untreated it can progress to pneumonia.  If your symptoms do not improve with your treatment plan it is important that you contact your provider.   I have prescribed Azithromyin 250 mg: two tables now and then one tablet daily for 4 additonal days   In addition you may use A non-prescription cough medication called Robitussin DAC. Take 2 teaspoons every 8 hours or Delsym: take 2 teaspoons every 12 hours., A non-prescription cough medication called Mucinex DM: take 2 tablets every 12 hours. and A prescription cough medication called Tessalon Perles 100mg. You may take 1-2 capsules every 8 hours as needed for your cough.    HOME CARE . Only take medications as instructed by your medical team. . Complete the entire course of an antibiotic. . Drink plenty of fluids and get plenty of rest. . Avoid close contacts especially the very young and the elderly . Cover your mouth if you cough or cough into your sleeve. . Always remember to wash your hands . A  steam or ultrasonic humidifier can help congestion.    GET HELP RIGHT AWAY IF: . You develop worsening fever. . You become short of breath . You cough up blood. . Your symptoms persist after you have completed your treatment plan MAKE SURE YOU   Understand these instructions.  Will watch your condition.  Will get help right away if you are not doing well or get worse.  Your e-visit answers were reviewed by a board certified advanced clinical practitioner to complete your personal care plan.  Depending on the condition, your plan could have included both over the counter or prescription medications.  If there is a problem please reply  once you have received a response from your provider.  Your safety is important to us.  If you have drug allergies check your prescription carefully.    You can use MyChart to ask questions about today's visit, request a non-urgent call back, or ask for a work or school excuse.  You will get an e-mail in the next two days asking about your experience.  I hope that your e-visit has been valuable and will speed your recovery. Thank you for using e-visits.   

## 2014-09-13 ENCOUNTER — Other Ambulatory Visit: Payer: Self-pay | Admitting: Family

## 2014-09-13 ENCOUNTER — Telehealth: Payer: Self-pay | Admitting: *Deleted

## 2014-09-13 NOTE — Telephone Encounter (Signed)
Called pt to ask if he had any issues with getting his medications he stated that he just had not been taking the medication (lisinopril, atorvastatin). He said that he is now taking the meds.Audelia Hives [place]

## 2015-02-06 ENCOUNTER — Other Ambulatory Visit: Payer: Self-pay | Admitting: Family Medicine

## 2015-03-29 ENCOUNTER — Ambulatory Visit (INDEPENDENT_AMBULATORY_CARE_PROVIDER_SITE_OTHER): Payer: Commercial Indemnity | Admitting: Family Medicine

## 2015-03-29 ENCOUNTER — Encounter: Payer: Self-pay | Admitting: Family Medicine

## 2015-03-29 VITALS — BP 130/75 | HR 89 | Temp 98.5°F | Wt 258.0 lb

## 2015-03-29 DIAGNOSIS — I10 Essential (primary) hypertension: Secondary | ICD-10-CM

## 2015-03-29 MED ORDER — HYDROCODONE-HOMATROPINE 5-1.5 MG/5ML PO SYRP
5.0000 mL | ORAL_SOLUTION | Freq: Every evening | ORAL | Status: DC | PRN
Start: 2015-03-29 — End: 2015-09-24

## 2015-03-29 NOTE — Progress Notes (Signed)
   Subjective:    Patient ID: Jackson Sherman, male    DOB: 1961/01/09, 54 y.o.   MRN: [id number]  HPI Sinusitis x 4 days he has been taking OTC meds for this.  Cough, + wheezing. No ST.   Hypertension- Pt denies chest pain, SOB, dizziness, or heart palpitations.  Taking meds as directed w/o problems.  Denies medication side effects.      Review of Systems     Objective:   Physical Exam  Constitutional: He is oriented to person, place, and time. He appears well-developed and well-nourished.  HENT:  Head: Normocephalic and atraumatic.  Right Ear: External ear normal.  Left Ear: External ear normal.  Nose: Nose normal.  Mouth/Throat: Oropharynx is clear and moist.  TMs and canals are clear.   Eyes: Conjunctivae and EOM are normal. Pupils are equal, round, and reactive to light.  Neck: Neck supple. No thyromegaly present.  Cardiovascular: Normal rate and normal heart sounds.   Pulmonary/Chest: Effort normal and breath sounds normal.  Lymphadenopathy:    He has no cervical adenopathy.  Neurological: He is alert and oriented to person, place, and time.  Skin: Skin is warm and dry.  Psychiatric: He has a normal mood and affect.          Assessment & Plan:  Acute respiratory infection-likely viral. Call if worse or just not better after one week. Cough medicine given for for bedtime only. Continue over-the-counter cough medications.  Hypertension-well-controlled. Continue current regimen. Follow-up in 6 months. Due for BMP.

## 2015-05-15 ENCOUNTER — Other Ambulatory Visit: Payer: Self-pay | Admitting: Family Medicine

## 2015-05-28 ENCOUNTER — Ambulatory Visit (INDEPENDENT_AMBULATORY_CARE_PROVIDER_SITE_OTHER): Payer: Commercial Indemnity | Admitting: Family Medicine

## 2015-05-28 ENCOUNTER — Encounter: Payer: Self-pay | Admitting: Family Medicine

## 2015-05-28 VITALS — BP 114/81 | HR 110 | Temp 98.5°F | Resp 18 | Wt 260.1 lb

## 2015-05-28 DIAGNOSIS — D229 Melanocytic nevi, unspecified: Secondary | ICD-10-CM

## 2015-05-28 DIAGNOSIS — Z23 Encounter for immunization: Secondary | ICD-10-CM

## 2015-05-28 DIAGNOSIS — D239 Other benign neoplasm of skin, unspecified: Secondary | ICD-10-CM

## 2015-05-28 MED ORDER — ESCITALOPRAM OXALATE 10 MG PO TABS
10.0000 mg | ORAL_TABLET | Freq: Every day | ORAL | Status: DC
Start: 2015-05-28 — End: 2016-02-10

## 2015-05-28 NOTE — Addendum Note (Signed)
Addended by: Elizabeth Sauer on: 05/28/2015 05:52 PM   Modules accepted: Orders
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
## 2015-05-31 DIAGNOSIS — Z23 Encounter for immunization: Secondary | ICD-10-CM

## 2015-09-03 ENCOUNTER — Other Ambulatory Visit: Payer: Self-pay | Admitting: Family Medicine

## 2015-09-03 ENCOUNTER — Telehealth: Payer: Self-pay | Admitting: Family Medicine

## 2015-09-03 NOTE — Telephone Encounter (Signed)
I left message for pt to call and schedule appt with pcp for BP and cholesterol

## 2015-09-24 ENCOUNTER — Encounter: Payer: Self-pay | Admitting: Family Medicine

## 2015-09-24 ENCOUNTER — Ambulatory Visit (INDEPENDENT_AMBULATORY_CARE_PROVIDER_SITE_OTHER): Payer: Commercial Indemnity | Admitting: Family Medicine

## 2015-09-24 VITALS — BP 121/79 | HR 73 | Wt 262.0 lb

## 2015-09-24 DIAGNOSIS — I10 Essential (primary) hypertension: Secondary | ICD-10-CM

## 2015-09-24 DIAGNOSIS — E785 Hyperlipidemia, unspecified: Secondary | ICD-10-CM | POA: Diagnosis not present

## 2015-09-24 DIAGNOSIS — L03115 Cellulitis of right lower limb: Secondary | ICD-10-CM | POA: Diagnosis not present

## 2015-09-24 DIAGNOSIS — Z23 Encounter for immunization: Secondary | ICD-10-CM | POA: Diagnosis not present

## 2015-09-24 DIAGNOSIS — Z114 Encounter for screening for human immunodeficiency virus [HIV]: Secondary | ICD-10-CM

## 2015-09-24 DIAGNOSIS — Z1159 Encounter for screening for other viral diseases: Secondary | ICD-10-CM

## 2015-09-24 DIAGNOSIS — L0390 Cellulitis, unspecified: Secondary | ICD-10-CM | POA: Insufficient documentation

## 2015-09-24 LAB — COMPLETE METABOLIC PANEL WITH GFR
ALBUMIN: 3.5 g/dL — AB (ref 3.6–5.1)
ALK PHOS: 63 U/L (ref 40–115)
ALT: 16 U/L (ref 9–46)
AST: 17 U/L (ref 10–35)
BUN: 16 mg/dL (ref 7–25)
CALCIUM: 8.4 mg/dL — AB (ref 8.6–10.3)
CHLORIDE: 103 mmol/L (ref 98–110)
CO2: 23 mmol/L (ref 20–31)
Creat: 1.07 mg/dL (ref 0.70–1.33)
GFR, EST NON AFRICAN AMERICAN: 78 mL/min (ref >=60)
Glucose, Bld: 73 mg/dL (ref 65–99)
POTASSIUM: 4.6 mmol/L (ref 3.5–5.3)
Sodium: 137 mmol/L (ref 135–146)
Total Bilirubin: 0.5 mg/dL (ref 0.2–1.2)
Total Protein: 6.7 g/dL (ref 6.1–8.1)

## 2015-09-24 LAB — LIPID PANEL
CHOL/HDL RATIO: 4.1 ratio (ref <=5.0)
CHOLESTEROL: 157 mg/dL (ref 125–200)
HDL: 38 mg/dL — ABNORMAL LOW (ref >=40)
LDL Cholesterol: 94 mg/dL (ref <130)
TRIGLYCERIDES: 123 mg/dL (ref <150)
VLDL: 25 mg/dL (ref <30)

## 2015-09-24 LAB — HEPATITIS C ANTIBODY: HCV Ab: NEGATIVE

## 2015-09-24 LAB — URIC ACID: Uric Acid, Serum: 7.6 mg/dL (ref 4.0–7.8)

## 2015-09-24 MED ORDER — DOXYCYCLINE HYCLATE 100 MG PO TABS: 100.0000 mg | ORAL_TABLET | Freq: Two times a day (BID) | ORAL | Status: DC

## 2015-09-24 MED ORDER — CEFUROXIME AXETIL 500 MG PO TABS: 500.0000 mg | ORAL_TABLET | Freq: Two times a day (BID) | ORAL | Status: DC

## 2015-09-24 MED ORDER — HYDROCODONE-ACETAMINOPHEN 5-325 MG PO TABS
1.0000 | ORAL_TABLET | Freq: Four times a day (QID) | ORAL | Status: DC | PRN
Start: 2015-09-24 — End: 2016-02-08

## 2015-09-24 NOTE — Patient Instructions (Signed)
Thank you for coming in today. Get labs today.  Return if not better.  Take doxycycline and ceftin twice daily for infection.  Use norco for severe pain.   Follow up with Dr. Madilyn Fireman.   Cellulitis Cellulitis is an infection of the skin and the tissue beneath it. The infected area is usually red and tender. Cellulitis occurs most often in the arms and lower legs.  CAUSES  Cellulitis is caused by bacteria that enter the skin through cracks or cuts in the skin. The most common types of bacteria that cause cellulitis are staphylococci and streptococci. SIGNS AND SYMPTOMS   Redness and warmth.  Swelling.  Tenderness or pain.  Fever. DIAGNOSIS  Your health care provider can usually determine what is wrong based on a physical exam. Blood tests may also be done. TREATMENT  Treatment usually involves taking an antibiotic medicine. HOME CARE INSTRUCTIONS   Take your antibiotic medicine as directed by your health care provider. Finish the antibiotic even if you start to feel better.  Keep the infected arm or leg elevated to reduce swelling.  Apply a warm cloth to the affected area up to 4 times per day to relieve pain.  Take medicines only as directed by your health care provider.  Keep all follow-up visits as directed by your health care provider. SEEK MEDICAL CARE IF:   You notice red streaks coming from the infected area.  Your red area gets larger or turns dark in color.  Your bone or joint underneath the infected area becomes painful after the skin has healed.  Your infection returns in the same area or another area.  You notice a swollen bump in the infected area.  You develop new symptoms.  You have a fever. SEEK IMMEDIATE MEDICAL CARE IF:   You feel very sleepy.  You develop vomiting or diarrhea.  You have a general ill feeling (malaise) with muscle aches and pains.   This information is not intended to replace advice given to you by your health care provider.  Make sure you discuss any questions you have with your health care provider.   Document Released: 03/25/2005 Document Revised: 03/06/2015 Document Reviewed: 08/31/2011 Elsevier Interactive Patient Education [employer].

## 2015-09-24 NOTE — Progress Notes (Signed)
   Subjective:    I'm seeing this patient as a consultation for:  Dr Suzi Roots  CC: Right Knee Pain  HPI: Patient notes a 2 day history of right knee pain and swelling. He denies any specific injury. Denies any fevers or chills nausea vomiting or diarrhea. He feels well. He presented to his primary care provider Dr. Madilyn Fireman today for evaluation. He was thought to have prepatellar bursitis and was referred to me for ultrasound evaluation.  Additionally he is here for routine fasting blood work as part of his routine healthcare for hyperlipidemia.  Past medical history, Surgical history, Family history not pertinant except as noted below, Social history, Allergies, and medications have been entered into the medical record, reviewed, and no changes needed.   Review of Systems: No headache, visual changes, nausea, vomiting, diarrhea, constipation, dizziness, abdominal pain, skin rash, fevers, chills, night sweats, weight loss, swollen lymph nodes, body aches, joint swelling, muscle aches, chest pain, shortness of breath, mood changes, visual or auditory hallucinations.   Objective:    Filed Vitals:   09/24/15 1002  BP: 121/79  Pulse: 73   General: Well Developed, well nourished, and in no acute distress.  Neuro/Psych: Alert and oriented x3, extra-ocular muscles intact, able to move all 4 extremities, sensation grossly intact. Skin: Warm and dry, no rashes noted.  Respiratory: Not using accessory muscles, speaking in full sentences, trachea midline.  Cardiovascular: Pulses palpable, no extremity edema. Abdomen: Does not appear distended. MSK: Right knee: Erythematous tender induration overlying the anterior flexor knee overlying the patella. Tiny abrasion present superficially. No significant fluctuance palpated. Pain with motion.  Limited musculoskeletal ultrasound of the right knee. Well appearing patella and patella tendon.  The subcutaneous tissue is marbled appearing Consistent with  cellulitis. There is a small tiny fluid collection inferior to the area of maximal tenderness consistent with a mild prepatellar bursitis.    No results found for this or any previous visit (from the past 24 hour(s)). No results found.  Impression and Recommendations:   This case required medical decision making of moderate complexity.  55 year old male with cellulitis. I think cellulitis is the main component of pain here I don't think there is much of a septic prepatellar bursitis. I think the risk of accessing the bursitis with a needle through an area of cellulitis is too great. Plan for treatment with oral doxycycline and Ceftin. Recheck in a few days. Pain control with Norco.  Additionally we will obtain routine fasting blood work as part of his PCP follow-up.         Subjective:    Patient ID: GLENWOOD PENDELTON, male    DOB: 02/21/1961, 55 y.o.   MRN: [id number]  HPI   Review of Systems     Objective:   Physical Exam        Assessment & Plan:  Right knee Pain

## 2015-09-25 ENCOUNTER — Encounter: Payer: Self-pay | Admitting: Family Medicine

## 2015-09-25 DIAGNOSIS — E790 Hyperuricemia without signs of inflammatory arthritis and tophaceous disease: Secondary | ICD-10-CM | POA: Insufficient documentation

## 2015-09-25 LAB — HIV ANTIBODY (ROUTINE TESTING W REFLEX): HIV: NONREACTIVE

## 2015-09-25 NOTE — Addendum Note (Signed)
Addended by: Teddy Spike on: 09/25/2015 08:42 AM   Modules accepted: Orders

## 2015-09-25 NOTE — Progress Notes (Signed)
Quick Note:  Labs are all pretty normal. Uric acid level is high indicating gout as a possibility to explain the pain. ______

## 2015-10-15 ENCOUNTER — Other Ambulatory Visit: Payer: Self-pay | Admitting: Family Medicine

## 2016-02-07 ENCOUNTER — Emergency Department (HOSPITAL_COMMUNITY): Payer: Managed Care, Other (non HMO)

## 2016-02-07 ENCOUNTER — Observation Stay (HOSPITAL_COMMUNITY)
Admission: EM | Admit: 2016-02-07 | Discharge: 2016-02-08 | Disposition: A | Discharge status: Home / Self Care | Payer: Managed Care, Other (non HMO) | Attending: Internal Medicine | Admitting: Internal Medicine

## 2016-02-07 ENCOUNTER — Encounter (HOSPITAL_COMMUNITY): Payer: Self-pay

## 2016-02-07 DIAGNOSIS — Z6841 Body Mass Index (BMI) 40.0 and over, adult: Secondary | ICD-10-CM | POA: Diagnosis present

## 2016-02-07 DIAGNOSIS — K219 Gastro-esophageal reflux disease without esophagitis: Secondary | ICD-10-CM | POA: Insufficient documentation

## 2016-02-07 DIAGNOSIS — G9340 Encephalopathy, unspecified: Secondary | ICD-10-CM | POA: Insufficient documentation

## 2016-02-07 DIAGNOSIS — E669 Obesity, unspecified: Secondary | ICD-10-CM | POA: Diagnosis not present

## 2016-02-07 DIAGNOSIS — F10229 Alcohol dependence with intoxication, unspecified: Secondary | ICD-10-CM | POA: Insufficient documentation

## 2016-02-07 DIAGNOSIS — E785 Hyperlipidemia, unspecified: Secondary | ICD-10-CM | POA: Insufficient documentation

## 2016-02-07 DIAGNOSIS — Z87891 Personal history of nicotine dependence: Secondary | ICD-10-CM | POA: Insufficient documentation

## 2016-02-07 DIAGNOSIS — F329 Major depressive disorder, single episode, unspecified: Secondary | ICD-10-CM | POA: Diagnosis not present

## 2016-02-07 DIAGNOSIS — I10 Essential (primary) hypertension: Secondary | ICD-10-CM | POA: Diagnosis not present

## 2016-02-07 DIAGNOSIS — M6289 Other specified disorders of muscle: Secondary | ICD-10-CM | POA: Diagnosis present

## 2016-02-07 DIAGNOSIS — Z6835 Body mass index (BMI) 35.0-35.9, adult: Secondary | ICD-10-CM | POA: Diagnosis not present

## 2016-02-07 DIAGNOSIS — R531 Weakness: Secondary | ICD-10-CM

## 2016-02-07 DIAGNOSIS — R404 Transient alteration of awareness: Secondary | ICD-10-CM

## 2016-02-07 DIAGNOSIS — F1010 Alcohol abuse, uncomplicated: Secondary | ICD-10-CM | POA: Diagnosis not present

## 2016-02-07 DIAGNOSIS — N179 Acute kidney failure, unspecified: Secondary | ICD-10-CM | POA: Insufficient documentation

## 2016-02-07 DIAGNOSIS — R4182 Altered mental status, unspecified: Secondary | ICD-10-CM | POA: Diagnosis present

## 2016-02-07 DIAGNOSIS — R569 Unspecified convulsions: Principal | ICD-10-CM

## 2016-02-07 LAB — DIFFERENTIAL
BASOS PCT: 1 %
Basophils Absolute: 0.1 10*3/uL (ref 0.0–0.1)
EOS PCT: 1 %
Eosinophils Absolute: 0.1 10*3/uL (ref 0.0–0.7)
Lymphocytes Relative: 39 %
Lymphs Abs: 5 10*3/uL — ABNORMAL HIGH (ref 0.7–4.0)
MONO ABS: 0.8 10*3/uL (ref 0.1–1.0)
MONOS PCT: 6 %
Neutro Abs: 6.9 10*3/uL (ref 1.7–7.7)
Neutrophils Relative %: 53 %

## 2016-02-07 LAB — I-STAT CG4 LACTIC ACID, ED
Lactic Acid, Venous: 2.93 mmol/L (ref 0.5–1.9)
Lactic Acid, Venous: 2.87 mmol/L (ref 0.5–1.9)

## 2016-02-07 LAB — CBC
HEMATOCRIT: 44.8 % (ref 39.0–52.0)
HEMOGLOBIN: 15.4 g/dL (ref 13.0–17.0)
MCH: 29.4 pg (ref 26.0–34.0)
MCHC: 34.4 g/dL (ref 30.0–36.0)
MCV: 85.5 fL (ref 78.0–100.0)
Platelets: 346 10*3/uL (ref 150–400)
RBC: 5.24 MIL/uL (ref 4.22–5.81)
RDW: 13.5 % (ref 11.5–15.5)
WBC: 12.9 10*3/uL — AB (ref 4.0–10.5)

## 2016-02-07 LAB — COMPREHENSIVE METABOLIC PANEL
ALK PHOS: 70 U/L (ref 38–126)
ALT: 29 U/L (ref 17–63)
AST: 26 U/L (ref 15–41)
Albumin: 3.6 g/dL (ref 3.5–5.0)
Anion gap: 13 (ref 5–15)
BILIRUBIN TOTAL: 0.5 mg/dL (ref 0.3–1.2)
BUN: 20 mg/dL (ref 6–20)
CALCIUM: 8.4 mg/dL — AB (ref 8.9–10.3)
CO2: 20 mmol/L — AB (ref 22–32)
CREATININE: 1.3 mg/dL — AB (ref 0.61–1.24)
Chloride: 104 mmol/L (ref 101–111)
GFR, EST NON AFRICAN AMERICAN: 60 mL/min — AB (ref >60)
Glucose, Bld: 141 mg/dL — ABNORMAL HIGH (ref 65–99)
Potassium: 4.2 mmol/L (ref 3.5–5.1)
Sodium: 137 mmol/L (ref 135–145)
Total Protein: 6.9 g/dL (ref 6.5–8.1)

## 2016-02-07 LAB — I-STAT CHEM 8, ED
BUN: 22 mg/dL — ABNORMAL HIGH (ref 6–20)
CREATININE: 1.5 mg/dL — AB (ref 0.61–1.24)
Calcium, Ion: 0.92 mmol/L — ABNORMAL LOW (ref 1.13–1.30)
Chloride: 105 mmol/L (ref 101–111)
GLUCOSE: 140 mg/dL — AB (ref 65–99)
HCT: 46 % (ref 39.0–52.0)
HEMOGLOBIN: 15.6 g/dL (ref 13.0–17.0)
POTASSIUM: 4.2 mmol/L (ref 3.5–5.1)
Sodium: 139 mmol/L (ref 135–145)
TCO2: 19 mmol/L (ref 0–100)

## 2016-02-07 LAB — LACTIC ACID, PLASMA
Lactic Acid, Venous: 1.7 mmol/L (ref 0.5–1.9)
Lactic Acid, Venous: 2.2 mmol/L (ref 0.5–1.9)

## 2016-02-07 LAB — APTT: aPTT: 27 seconds (ref 24–36)

## 2016-02-07 LAB — ETHANOL
Alcohol, Ethyl (B): 266 mg/dL — ABNORMAL HIGH (ref <5)
Alcohol, Ethyl (B): 205 mg/dL — ABNORMAL HIGH (ref <5)

## 2016-02-07 LAB — I-STAT TROPONIN, ED: TROPONIN I, POC: 0.02 ng/mL (ref 0.00–0.08)

## 2016-02-07 LAB — PROTIME-INR
INR: 1
Prothrombin Time: 13.1 seconds (ref 11.4–15.2)

## 2016-02-07 LAB — CBG MONITORING, ED: Glucose-Capillary: 129 mg/dL — ABNORMAL HIGH (ref 65–99)

## 2016-02-07 LAB — AMMONIA: Ammonia: 36 umol/L — ABNORMAL HIGH (ref 9–35)

## 2016-02-07 LAB — PHOSPHORUS: Phosphorus: 3.6 mg/dL (ref 2.5–4.6)

## 2016-02-07 LAB — MAGNESIUM: Magnesium: 2.2 mg/dL (ref 1.7–2.4)

## 2016-02-07 MED ORDER — SODIUM CHLORIDE 0.9 % IV SOLN
75.0000 mL/h | INTRAVENOUS | Status: DC
  Administered 2016-02-07 – 2016-02-08 (×2): 75 mL/h via INTRAVENOUS

## 2016-02-07 MED ORDER — THIAMINE HCL 100 MG/ML IJ SOLN: 100.0000 mg | Freq: Every day | INTRAMUSCULAR | Status: DC

## 2016-02-07 MED ORDER — ACETAMINOPHEN 650 MG RE SUPP: 650.0000 mg | Freq: Four times a day (QID) | RECTAL | Status: DC | PRN

## 2016-02-07 MED ORDER — ATORVASTATIN CALCIUM 20 MG PO TABS
20.0000 mg | ORAL_TABLET | Freq: Every day | ORAL | Status: DC
  Filled 2016-02-07: qty 1

## 2016-02-07 MED ORDER — PANTOPRAZOLE SODIUM 40 MG PO TBEC
40.0000 mg | DELAYED_RELEASE_TABLET | Freq: Every day | ORAL | Status: DC
  Administered 2016-02-08: 40 mg via ORAL
  Filled 2016-02-07: qty 1

## 2016-02-07 MED ORDER — BISACODYL 5 MG PO TBEC: 5.0000 mg | DELAYED_RELEASE_TABLET | Freq: Every day | ORAL | Status: DC | PRN

## 2016-02-07 MED ORDER — SODIUM CHLORIDE 0.9 % IV SOLN: INTRAVENOUS | Status: DC

## 2016-02-07 MED ORDER — LORAZEPAM 1 MG PO TABS: 1.0000 mg | ORAL_TABLET | Freq: Four times a day (QID) | ORAL | Status: DC | PRN

## 2016-02-07 MED ORDER — ESCITALOPRAM OXALATE 10 MG PO TABS
10.0000 mg | ORAL_TABLET | Freq: Every day | ORAL | Status: DC
  Administered 2016-02-08: 10 mg via ORAL
  Filled 2016-02-07: qty 1

## 2016-02-07 MED ORDER — VITAMIN B-1 100 MG PO TABS
100.0000 mg | ORAL_TABLET | Freq: Every day | ORAL | Status: DC
  Administered 2016-02-08: 100 mg via ORAL
  Filled 2016-02-07: qty 1

## 2016-02-07 MED ORDER — ADULT MULTIVITAMIN W/MINERALS CH
1.0000 | ORAL_TABLET | Freq: Every day | ORAL | Status: DC
  Administered 2016-02-08: 1 via ORAL
  Filled 2016-02-07: qty 1

## 2016-02-07 MED ORDER — SODIUM CHLORIDE 0.9 % IV BOLUS (SEPSIS)
1000.0000 mL | Freq: Once | INTRAVENOUS | Status: AC
  Administered 2016-02-07: 1000 mL via INTRAVENOUS

## 2016-02-07 MED ORDER — GUAIFENESIN ER 600 MG PO TB12
600.0000 mg | ORAL_TABLET | Freq: Two times a day (BID) | ORAL | Status: DC
  Administered 2016-02-07 – 2016-02-08 (×2): 600 mg via ORAL
  Filled 2016-02-07 (×2): qty 1

## 2016-02-07 MED ORDER — POLYETHYLENE GLYCOL 3350 17 G PO PACK: 17.0000 g | PACK | Freq: Every day | ORAL | Status: DC | PRN

## 2016-02-07 MED ORDER — ALBUTEROL SULFATE (2.5 MG/3ML) 0.083% IN NEBU: 2.5000 mg | INHALATION_SOLUTION | RESPIRATORY_TRACT | Status: DC | PRN

## 2016-02-07 MED ORDER — SODIUM CHLORIDE 0.9% FLUSH
3.0000 mL | Freq: Two times a day (BID) | INTRAVENOUS | Status: DC
  Administered 2016-02-08: 3 mL via INTRAVENOUS

## 2016-02-07 MED ORDER — FOLIC ACID 1 MG PO TABS
1.0000 mg | ORAL_TABLET | Freq: Every day | ORAL | Status: DC
  Administered 2016-02-08: 1 mg via ORAL
  Filled 2016-02-07: qty 1

## 2016-02-07 MED ORDER — ONDANSETRON HCL 4 MG/2ML IJ SOLN: 4.0000 mg | Freq: Four times a day (QID) | INTRAMUSCULAR | Status: DC | PRN

## 2016-02-07 MED ORDER — ENOXAPARIN SODIUM 40 MG/0.4ML ~~LOC~~ SOLN
40.0000 mg | SUBCUTANEOUS | Status: DC
  Administered 2016-02-07: 40 mg via SUBCUTANEOUS
  Filled 2016-02-07: qty 0.4

## 2016-02-07 MED ORDER — ONDANSETRON HCL 4 MG PO TABS: 4.0000 mg | ORAL_TABLET | Freq: Four times a day (QID) | ORAL | Status: DC | PRN

## 2016-02-07 MED ORDER — LORAZEPAM 2 MG/ML IJ SOLN: 1.0000 mg | Freq: Four times a day (QID) | INTRAMUSCULAR | Status: DC | PRN

## 2016-02-07 MED ORDER — ACETAMINOPHEN 325 MG PO TABS: 650.0000 mg | ORAL_TABLET | Freq: Four times a day (QID) | ORAL | Status: DC | PRN

## 2016-02-07 NOTE — CV Procedure (Signed)
History: Jackson Sherman is a 55 year old gentleman who presents with altered mental status suspicious for a possible seizure.  Sedation: None  Technique: This is a 21 channel routine scalp EEG performed at the bedside with bipolar and monopolar montages arranged in accordance to the international 10/20 system of electrode placement. One channel was dedicated to EKG recording.    Background: The background consists of intermixed alpha and beta activities. There is a well defined posterior dominant rhythm of 6-7 Hz that attenuates with eye opening. Sleep is recorded with normal appearing structures.   Photic stimulation: Not performed  EEG Abnormalities: Posterior background slowing.  Clinical Interpretation: This is a mildly abnormal EEG due to the presence of mild posterior background slowing. The presence of mild posterior background slowing is a nonspecific finding and is indicative of mild encephalopathy.  Dr. Gerda Diss. Tasia Catchings, MD Neurohospitalist

## 2016-02-07 NOTE — H&P (Signed)
History and Physical    GAURAV ROHLMAN [id number] DOB: 1961-06-22 DOA: 02/07/2016  PCP: Beatrice Lecher, MD  Patient coming from:   work Chief Complaint: left sided weakness, drooling and confusion  HPI: Jackson Sherman is a 55 y.o. male with a medical history significant for, but not  limited to, hypertension and hyperlipidemia. Patient works as a Counsellor for [employer] . He was at work today when a coworker found him confused, drooling from left side of his face. Upon arrival to the ED patient was still confused. No loss of bowel or bladder function . Patient does not remember anything except riding in the ambulance. He felt completely fine this morning though yesterday he had several bouts of diarrhea. . No history of seizures, TIAs / CVA. Patient has never had an episode like this before. He has not recently started nor discontinued any medications. No alcohol use. Patient does describe visual disturbances that occur about once a month while at work. Usually left-sided, patient will get a colorful Prizm circling his left eye. Symptoms last for about 2 hours but don't evolve into a migraine. Last episode of visual disturbance was approximately 2 weeks ago. He never has any associated weakness, slurred speech or confusion associated with these visual disturbances   ED Course:  Head CT and MRI of the head negative for stroke Ammonia 36, lactic acid 2.93 BUN 22, creatinine 1.5 Total calcium 8.4, ionized calcium 0.92 Phosphorus and magnesium normal. WBC 12.9, atypical lymphocytes, normal hemoglobin.  Review of Systems: As per HPI, otherwise 10 point review of systems negative.    Past Medical History:  Diagnosis Date  . 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 Depression   . GERD (gastroesophageal reflux disease)   . Hemorrhoids   . Hyperlipidemia   . Hypertension     Past Surgical History:  Procedure Laterality Date  . ARTHROSCOPIC REPAIR ACL     left  . HEMORRHOID  SURGERY N/A 11/23/2012   Procedure: HEMORRHOIDECTOMY;  Surgeon: Madilyn Hook, DO;  Location: [facility];  Service: General;  Laterality: N/A;  . TONSILLECTOMY     Age 27     Social History   Social History  . Marital status: Married    Spouse name: N/A  . Number of children: 4  . Years of education: N/A   Occupational History  .  [place] History Main Topics  . Smoking status: Former Smoker    Years: 15.00    Types: Cigarettes    Quit date: 06/30/2003  . Smokeless tobacco: Not on file  . Alcohol use No     Comment: Quit 03-2005, attending [place] meetings   . Drug use: No  . Sexual activity: Yes    Partners: Female     Comment: married, 4 kids, works for [employer], trying to lose wt., started exercising program   Other Topics Concern  . Not on file   Social History Narrative   No regular exercise.  3-4 cups per day of caffeine.     Married, lives with wife. No assistive devices needed for ambulation No Known Allergies  Family History  Problem Relation Age of Onset  . Heart disease Maternal Grandmother 74  . Stroke Maternal Grandmother   . Diabetes Maternal Grandmother   . Hyperlipidemia Mother   . Hypertension Mother   . Breast cancer Mother 77  . Depression Sister     Prior to Admission medications   Medication Sig Start Date  End Date Taking? Authorizing Provider  atorvastatin (LIPITOR) 20 MG tablet TAKE ONE TABLET BY MOUTH ONCE DAILY AT 6 PM 10/15/15   [name], MD  cefUROXime (CEFTIN) 500 MG tablet Take 1 tablet (500 mg total) by mouth 2 (two) times daily with a meal. 09/24/15   Gregor Hams, MD  doxycycline (VIBRA-TABS) 100 MG tablet Take 1 tablet (100 mg total) by mouth 2 (two) times daily. 09/24/15   Gregor Hams, MD  escitalopram (LEXAPRO) 10 MG tablet Take 1 tablet (10 mg total) by mouth daily. 05/28/15   [name], MD  HYDROcodone-acetaminophen (NORCO/VICODIN) 5-325 MG tablet Take 1 tablet by mouth every 6 (six)  hours as needed. 09/24/15   Gregor Hams, MD  lisinopril (PRINIVIL,ZESTRIL) 20 MG tablet TAKE ONE TABLET BY MOUTH ONCE DAILY BEFORE BREAKFAST 10/15/15   [name], MD  loratadine (CLARITIN) 10 MG tablet Take 10 mg by mouth daily.    Historical Provider, MD  traZODone (DESYREL) 50 MG tablet TAKE ONE-HALF TO TWO TABLETS BY MOUTH ONCE DAILY AT BEDTIME AS NEEDED FOR SLEEP 09/03/15   [name], MD    Physical Exam: Vitals:   02/07/16 1409  BP: 102/88  Pulse: 101  Resp: 20  Temp: 98.1 F (36.7 C)  SpO2: 97%    Constitutional:  Pleasant obese white male NAD, calm, comfortable Vitals:   02/07/16 1409  BP: 102/88  Pulse: 101  Resp: 20  Temp: 98.1 F (36.7 C)  SpO2: 97%   Eyes: PER, lids and conjunctivae normal ENMT: Mucous membranes are moist. Posterior pharynx clear of any exudate or lesions..  Neck: normal, supple, no masses Respiratory: clear to auscultation bilaterally, no wheezing, no crackles. Normal respiratory effort. No accessory muscle use.  Cardiovascular: Regular rate and rhythm, no murmurs / rubs / gallops. No extremity edema. 2+ dorsal pedis pulses.   Abdomen: no tenderness, no masses palpated. No hepatomegaly. Bowel sounds positive.  Musculoskeletal: no clubbing / cyanosis. No joint deformity upper and lower extremities. Good ROM, no contractures. Normal muscle tone.  Skin: no rashes, lesions, ulcers.  Neurologic: CN 2-12 grossly intact. Sensation intact, Strength 5/5 in all 4.  Psychiatric: Normal judgment and insight. Alert and oriented x 3. Normal mood.   Labs on Admission: I have personally reviewed following labs and imaging studies  Radiological Exams on Admission: Mr Brain Ltd Wo Contrast  Result Date: 02/07/2016 CLINICAL DATA:  Code stroke. Acute left-sided weakness and slurred speech EXAM: MRI HEAD WITHOUT CONTRAST TECHNIQUE: Multiplanar, multiecho pulse sequences of the brain and surrounding structures were obtained without intravenous  contrast. COMPARISON:  CT head Nov 07, 2015 FINDINGS: Ventricle size normal.  Cerebral volume normal. Negative for acute infarct. No significant chronic ischemic changes. Negative for demyelinating disease. Basal ganglia brainstem and cerebellum normal. Negative for hemorrhage or fluid collection. Negative for mass or edema.  No shift of the midline structures. Circle of Willis patent. Normal orbital structures.  Paranasal sinuses clear. Pituitary normal in size. IMPRESSION: Normal for age. Electronically Signed   By: Franchot Gallo M.D.   On: 02/07/2016 15:20   Ct Head Code Stroke W/o Cm  Result Date: 02/07/2016 CLINICAL DATA:  Code stroke. Acute left-sided weakness. Slurred speech. Last seen 15 minutes prior to arrival. EXAM: CT HEAD WITHOUT CONTRAST TECHNIQUE: Contiguous axial images were obtained from the base of the skull through the vertex without intravenous contrast. COMPARISON:  CT neck 05/06/2010 FINDINGS: No acute infarct, hemorrhage, or mass lesion is present. The  ventricles are of normal size. The vessels are somewhat dense throughout but symmetric. The basal ganglia and insular cortex are normal bilaterally. No focal cortical lesion is present. Scattered subcortical white matter hypoattenuation is present bilaterally. No significant extra-axial fluid collection is present. The paranasal sinuses and mastoid air cells are clear. The calvarium is intact. No significant extracranial soft tissue lesions are present. The globes and orbits are within normal limits. ASPECTS [facility] Stroke Program Early CT Score, http://[email or web address].[email or web address]) - Ganglionic level infarction (caudate, lentiform nuclei, internal capsule, insula, M1-M3 cortex): 7/7 - Supraganglionic infarction (M4-M6 cortex): 3/3 Total score (0-10 with 10 being normal): 10/10 IMPRESSION: 1. No acute intracranial abnormality. 2. Scattered subcortical white matter hypoattenuation suggesting chronic microvascular ischemic change, advanced for  age. 3. ASPECTS score 10/10 These results were called by telephone at the time of interpretation on 02/07/2016 at 2:12 pm to Drs. Alvina Chou, who verbally acknowledged these results. Electronically Signed   By: San Morelle M.D.   On: 02/07/2016 14:12    EKG: Independently reviewed.   EKG Interpretation  Date/Time:  Friday February 07 2016 14:02:34 EDT Ventricular Rate:  104 PR Interval:    QRS Duration: 106 QT Interval:  340 QTC Calculation: 448 R Axis:   -8 Text Interpretation:  Sinus tachycardia Low voltage, precordial leads Borderline T abnormalities, inferior leads Confirmed by Hazle Coca [phone]) on 02/07/2016 2:13:17 PM        Assessment/Plan   Active Problems:   Altered mental status      Acute encephalopathy with drooling, left sided weakness. Head CTscan and MRI negative for acute findings.  EEG c/w nonspecific finding of mild encephalopathy           -place in OBS-tele -Neurology has evaluated -Denied ETOH but [place] level has returned at 266 -CIWA protocol -Seizure precautions -Patient is going to need treatment for ETOH.     AKI.  Cr 1.5, up from 1.07 in March.  -Will hopefully improve with hydration -avoid nephrotoxic medications (prinivil, aleve) -am bmet  Hypertension. -hold ACEI for AKI -prn hydralazine  Hyperlipidemia. -continue home statin  GERD. -continue home PPI  Hx of depression.  -continue home lexapro.                                     DVT prophylaxis:   Lovenox    Code Status:     Full code Family Communication:    Treatment plan discussed with parents in the room (except for ETOH level) and they understand and agree with the plan..  Disposition Plan:   Discharge home in 24-48 hours              Consults called:  EDP consulted Neurology who has already signed off  Admission status:   Observation - Telemetry    Tye Savoy NP [facility] Pager [phone]  If 7PM-7AM, please contact  night-coverage [email or web address] Password [facility]  02/07/2016, 4:57 PM

## 2016-02-07 NOTE — ED Notes (Signed)
Abigail-PA notified of elevated CG-4

## 2016-02-07 NOTE — Consult Note (Signed)
Requesting Physician: Dr. Ayesha Rumpf    Chief Complaint: AMS and possible facial droop   HPI:                                                                                                                                         Jackson Sherman is an 55 y.o. male who was brought to ED after coworkers noted he was not acting himself. He was noted to be confused and drooling on the left side of his face. There was mention of possible left facial droop. On arrival he was confused having hard time answering questions (encephalopathic) and generally weak. Due to presentation and no focal deficits tPA was not given. CT head was negative and he was brought for a stat MRI. While waiting for MRI he noted to coworkers he had significant diarrhea the day prior. MRI DWI was obtained and did not show any acute infarct.   Date last known well: Date: 02/07/2016 Time last known well: Time: 13:25 tPA Given: No: no significant deficits and negative DWI    Past Medical History:  Diagnosis Date  . 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 Depression   . GERD (gastroesophageal reflux disease)   . Hemorrhoids   . Hyperlipidemia   . Hypertension     Past Surgical History:  Procedure Laterality Date  . ARTHROSCOPIC REPAIR ACL     left  . HEMORRHOID SURGERY N/A 11/23/2012   Procedure: HEMORRHOIDECTOMY;  Surgeon: Madilyn Hook, DO;  Location: [facility];  Service: General;  Laterality: N/A;  . TONSILLECTOMY     Age 40     Family History  Problem Relation Age of Onset  . Heart disease Maternal Grandmother 27  . Stroke Maternal Grandmother   . Diabetes Maternal Grandmother   . Hyperlipidemia Mother   . Hypertension Mother   . Breast cancer Mother 28  . Depression Sister    Social History:  reports that he quit smoking about 12 years ago. His smoking use included Cigarettes. He quit after 15.00 years of use. He does not have any smokeless tobacco history on file. He reports that he does not drink alcohol or  use drugs.  Allergies: No Known Allergies  Medications:  No current facility-administered medications for this encounter.    Current Outpatient Prescriptions  Medication Sig Dispense Refill  . atorvastatin (LIPITOR) 20 MG tablet TAKE ONE TABLET BY MOUTH ONCE DAILY AT 6 PM 30 tablet 0  . cefUROXime (CEFTIN) 500 MG tablet Take 1 tablet (500 mg total) by mouth 2 (two) times daily with a meal. 14 tablet 0  . doxycycline (VIBRA-TABS) 100 MG tablet Take 1 tablet (100 mg total) by mouth 2 (two) times daily. 14 tablet 0  . escitalopram (LEXAPRO) 10 MG tablet Take 1 tablet (10 mg total) by mouth daily. 90 tablet 1  . HYDROcodone-acetaminophen (NORCO/VICODIN) 5-325 MG tablet Take 1 tablet by mouth every 6 (six) hours as needed. 15 tablet 0  . lisinopril (PRINIVIL,ZESTRIL) 20 MG tablet TAKE ONE TABLET BY MOUTH ONCE DAILY BEFORE BREAKFAST 90 tablet 0  . loratadine (CLARITIN) 10 MG tablet Take 10 mg by mouth daily.    . traZODone (DESYREL) 50 MG tablet TAKE ONE-HALF TO TWO TABLETS BY MOUTH ONCE DAILY AT BEDTIME AS NEEDED FOR SLEEP 45 tablet 0     ROS:                                                                                                                                       History obtained from coworkers  General ROS: negative for - chills, fatigue, fever, night sweats, weight gain or weight loss Psychological ROS: negative for - behavioral disorder, hallucinations, memory difficulties, mood swings or suicidal ideation Ophthalmic ROS: negative for - blurry vision, double vision, eye pain or loss of vision ENT ROS: negative for - epistaxis, nasal discharge, oral lesions, sore throat, tinnitus or vertigo Allergy and Immunology ROS: negative for - hives or itchy/watery eyes Hematological and Lymphatic ROS: negative for - bleeding problems, bruising or swollen lymph  nodes Endocrine ROS: negative for - galactorrhea, hair pattern changes, polydipsia/polyuria or temperature intolerance Respiratory ROS: negative for - cough, hemoptysis, shortness of breath or wheezing Cardiovascular ROS: negative for - chest pain, dyspnea on exertion, edema or irregular heartbeat Gastrointestinal ROS: positive for -  diarrhea, Genito-Urinary ROS: negative for - dysuria, hematuria, incontinence or urinary frequency/urgency Musculoskeletal ROS: negative for - joint swelling or muscular weakness Neurological ROS: as noted in HPI Dermatological ROS: negative for rash and skin lesion changes  Neurologic Examination:  Blood pressure 102/88, pulse 101, temperature 98.1 F (36.7 C), resp. rate 20, SpO2 97 %.  HEENT-  Normocephalic, no lesions, without obvious abnormality.  Normal external eye and conjunctiva.  Normal TM's bilaterally.  Normal auditory canals and external ears. Normal external nose, mucus membranes and septum.  Normal pharynx. Cardiovascular- S1, S2 normal, pulses palpable throughout   Lungs- chest clear, no wheezing, rales, normal symmetric air entry Abdomen- normal findings: bowel sounds normal Extremities- no edema Lymph-no adenopathy palpable Musculoskeletal-no joint tenderness, deformity or swelling Skin-warm and dry, no hyperpigmentation, vitiligo, or suspicious lesions  Neurological Examination Mental Status: Alert, disoriented.  Speech fluent without evidence of aphasia.  Able to follow 3 step commands without difficulty. Cranial Nerves: II:  Visual fields grossly normal, pupils equal, round, reactive to light and accommodation III,IV, VI: ptosis not present, extra-ocular motions intact bilaterally V,VII: smile symmetric, facial light touch sensation normal bilaterally VIII: hearing normal bilaterally IX,X: uvula rises symmetrically XI: bilateral  shoulder shrug XII: midline tongue extension Motor: 4+/5 throughout Sensory: Pinprick and light touch intact throughout, bilaterally Deep Tendon Reflexes: 2+ and symmetric throughout Plantars: Right: downgoing   Left: downgoing Cerebellar: normal finger-to-nose and normal heel-to-shin test Gait: not tested       Lab Results: Basic Metabolic Panel:  Recent Labs Lab 02/07/16 1359 02/07/16 1411  NA 137 139  K 4.2 4.2  CL 104 105  CO2 20*  --   GLUCOSE 141* 140*  BUN 20 22*  CREATININE 1.30* 1.50*  CALCIUM 8.4*  --     Liver Function Tests:  Recent Labs Lab 02/07/16 1359  AST 26  ALT 29  ALKPHOS 70  BILITOT 0.5  PROT 6.9  ALBUMIN 3.6   No results for input(s): LIPASE, AMYLASE in the last 168 hours. No results for input(s): AMMONIA in the last 168 hours.  CBC:  Recent Labs Lab 02/07/16 1359 02/07/16 1411  WBC 12.9*  --   NEUTROABS 6.9  --   HGB 15.4 15.6  HCT 44.8 46.0  MCV 85.5  --   PLT 346  --     Cardiac Enzymes: No results for input(s): CKTOTAL, CKMB, CKMBINDEX, TROPONINI in the last 168 hours.  Lipid Panel: No results for input(s): CHOL, TRIG, HDL, CHOLHDL, VLDL, LDLCALC in the last 168 hours.  CBG:  Recent Labs Lab 02/07/16 1408  GLUCAP 129*    Microbiology: Results for orders placed or performed during the hospital encounter of 11/10/12  Surgical pcr screen     Status: Abnormal   Collection Time: 11/10/12  9:45 AM  Result Value Ref Range Status   MRSA, PCR NEGATIVE NEGATIVE Final   Staphylococcus aureus POSITIVE (A) NEGATIVE Final    Comment:        The Xpert SA Assay (FDA approved for NASAL specimens in patients over 49 years of age), is one component of a comprehensive surveillance program.  Test performance has been validated by [employer] for patients greater than or equal to 70 year old. It is not intended to diagnose infection nor to guide or monitor treatment.    Coagulation Studies:  Recent Labs   02/07/16 1359  LABPROT 13.1  INR 1.00    Imaging: Ct Head Code Stroke W/o Cm  Result Date: 02/07/2016 CLINICAL DATA:  Code stroke. Acute left-sided weakness. Slurred speech. Last seen 15 minutes prior to arrival. EXAM: CT HEAD WITHOUT CONTRAST TECHNIQUE: Contiguous axial images were obtained from the base of the skull through the vertex without intravenous contrast. COMPARISON:  CT neck 05/06/2010 FINDINGS: No acute infarct, hemorrhage, or mass lesion is present. The ventricles are of normal size. The vessels are somewhat dense throughout but symmetric. The basal ganglia and insular cortex are normal bilaterally. No focal cortical lesion is present. Scattered subcortical white matter hypoattenuation is present bilaterally. No significant extra-axial fluid collection is present. The paranasal sinuses and mastoid air cells are clear. The calvarium is intact. No significant extracranial soft tissue lesions are present. The globes and orbits are within normal limits. ASPECTS [facility] Stroke Program Early CT Score, http://www.[email or web address]) - Ganglionic level infarction (caudate, lentiform nuclei, internal capsule, insula, M1-M3 cortex): 7/7 - Supraganglionic infarction (M4-M6 cortex): 3/3 Total score (0-10 with 10 being normal): 10/10 IMPRESSION: 1. No acute intracranial abnormality. 2. Scattered subcortical white matter hypoattenuation suggesting chronic microvascular ischemic change, advanced for age. 3. ASPECTS score 10/10 These results were called by telephone at the time of interpretation on 02/07/2016 at 2:12 pm to Drs. Alvina Chou, who verbally acknowledged these results. Electronically Signed   By: San Morelle M.D.   On: 02/07/2016 14:12       Assessment and plan discussed with with attending physician and they are in agreement.    Etta Quill PA-C [facility] [phone]  02/07/2016, 3:19 PM   Assessment: 55 y.o. male presenting with confusion and possible  left sided weakness. Exam showed diffuse weakness and no focal signs. He appears encephalopathic at this time. MRI brain was done and showed no abnormalities or stroke. WBC is 12.9 his BP is 102/88 but he is afebrile. HE has had significant diarrhea over the past 24 hours. I suspect this is likely combination of dehydration and metabolic/toxic encephalopathy.  Recommend: 1) Treat underlying dehydration and elevated WBC source 2) will obtain EEG.  3) At this time no further stroke work up warranted.   Stroke Risk Factors - hyperlipidemia and hypertension   Neurology attending:  [name] presented with some confusion. His cranial nerve exam was normal. He had no focal neurological deficits. There was a concern for possible transient global amnesia. MRI of the brain revealed no evidence of ischemic change. The initial concern was that this represented an episode of transient global amnesia.  Subsequent laboratory studies revealed an elevated alcohol level of 266. The ammonia level was mildly elevated at 36.  It is considered likely that the episode of altered mental status was most likely related to the consumption of alcohol.  The EEG revealed presence of posterior background slowing. This is a nonspecific finding and is consistent with a mild encephalopathy.  Plan:  1. Consider additional counseling for alcohol consumption at work.  2. Consider additional full drug screen.  3. Given the neurologic exam and workup have been unremarkable neurology will sign off at this time. Please reconsult with any new issues.   Elson Clan M.D. [facility]

## 2016-02-07 NOTE — Code Documentation (Signed)
55yo male arriving to [facility] via [place] at 1344.  Patient works at [employer]  where he was noticed to have sudden onset confusion and left sided weakness at 63.  Patient was transported to [facility] via [place].  Code stroke activated by [facility].  Patient to CT on arrival.  Stroke team to the bedside.  CT completed.  NIHSS 6, see documentation for details and code stroke times.  Patient with left facial droop and inconsistent drift of BUE and LLE.  Dr. Tasia Catchings to the bedside.  Patient to MRI per MD with ED RN.  MRI DWI negative for acute infarct per Neuro PA.  Handoff with ED RN [facility] via telephone.

## 2016-02-07 NOTE — ED Provider Notes (Signed)
[facility] DEPT Provider Note   CSN: [id number] Arrival date & time: 02/07/16  1344  First Provider Contact:  First MD Initiated Contact with Patient 02/07/16 1345        History   Chief Complaint Chief Complaint  Patient presents with  . Code Stroke   Level 5 Caveat due to confusion HPI Jackson Sherman is a 55 y.o. male who presents via EMS. He had sudden onset left facial droop and left weakness with some confusion about 15 minutes prior to arrival in the emergency department. 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    HPI  Past Medical History:  Diagnosis Date  . 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 Depression   . GERD (gastroesophageal reflux disease)   . Hemorrhoids   . Hyperlipidemia   . Hypertension     Patient Active Problem List   Diagnosis Date Noted  . Altered mental status 02/07/2016  . Elevated uric acid in blood 09/25/2015  . Cellulitis 09/24/2015  . Depression 08/22/2012  . Insomnia 05/06/2010  . Hyperlipidemia 04/06/2006  . OBESITY, NOS 04/06/2006  . ALCOHOL DEPENDENCE 04/06/2006  . HYPERTENSION, BENIGN SYSTEMIC 04/06/2006    Past Surgical History:  Procedure Laterality Date  . ARTHROSCOPIC REPAIR ACL     left  . HEMORRHOID SURGERY N/A 11/23/2012   Procedure: HEMORRHOIDECTOMY;  Surgeon: Madilyn Hook, DO;  Location: [facility];  Service: General;  Laterality: N/A;  . TONSILLECTOMY     Age 49        Home Medications    Prior to Admission medications   Medication Sig Start Date End Date Taking? Authorizing Provider  atorvastatin (LIPITOR) 20 MG tablet TAKE ONE TABLET BY MOUTH ONCE DAILY AT 6 PM 10/15/15   [name], MD  cefUROXime (CEFTIN) 500 MG tablet Take 1 tablet (500 mg total) by mouth 2 (two) times daily with a meal. 09/24/15    Gregor Hams, MD  doxycycline (VIBRA-TABS) 100 MG tablet Take 1 tablet (100 mg total) by mouth 2 (two) times daily. 09/24/15   Gregor Hams, MD  escitalopram (LEXAPRO) 10 MG tablet Take 1 tablet (10 mg total) by mouth daily. 05/28/15   [name], MD  HYDROcodone-acetaminophen (NORCO/VICODIN) 5-325 MG tablet Take 1 tablet by mouth every 6 (six) hours as needed. 09/24/15   Gregor Hams, MD  lisinopril (PRINIVIL,ZESTRIL) 20 MG tablet TAKE ONE TABLET BY MOUTH ONCE DAILY BEFORE BREAKFAST 10/15/15   [name], MD  loratadine (CLARITIN) 10 MG tablet Take 10 mg by mouth daily.    Historical Provider, MD  traZODone (DESYREL) 50 MG tablet TAKE ONE-HALF TO TWO TABLETS BY MOUTH ONCE DAILY AT BEDTIME AS NEEDED FOR SLEEP 09/03/15   [name], MD    Family History Family History  Problem Relation Age of Onset  . Heart disease Maternal Grandmother 73  . Stroke Maternal Grandmother   . Diabetes Maternal Grandmother   . Hyperlipidemia Mother   . Hypertension Mother   . Breast cancer Mother 41  . Depression Sister     Social History Social History  Substance Use Topics  . Smoking status: Former Smoker    Years: 15.00    Types: Cigarettes    Quit date: 06/30/2003  . Smokeless tobacco: Not on file  . Alcohol use No  Comment: Quit 03-2005, attending AA meetings      Allergies   Review of patient's allergies indicates no known allergies.   Review of Systems Review of Systems  Unable to perform ROS: Acuity of condition      Physical Exam Updated Vital Signs BP 102/88 (BP Location: Right Arm)   Pulse 101   Temp 98.1 F (36.7 C)   Resp 20   SpO2 97%   Physical Exam  Constitutional: He appears well-developed and well-nourished.  HENT:  Head: Normocephalic and atraumatic.  Left facial droop  Eyes: Conjunctivae and EOM are normal. Pupils are equal, round, and reactive to light.  Neck: Normal range of motion. No JVD present. No thyromegaly present.    Cardiovascular: Normal rate and regular rhythm.   Pulmonary/Chest: Effort normal and breath sounds normal.  Abdominal: Soft. Bowel sounds are normal. He exhibits no distension.  Musculoskeletal: He exhibits no edema or deformity.  Neurological: He is alert.  Patient with Left facial weakness  Left arm and leg weakness Answers questions appropriately Airway clear   Skin: Skin is warm.  Psychiatric: He has a normal mood and affect.  Vitals reviewed.    ED Treatments / Results  Labs (all labs ordered are listed, but only abnormal results are displayed) Labs Reviewed  CBC - Abnormal; Notable for the following:       Result Value   WBC 12.9 (*)    All other components within normal limits  DIFFERENTIAL - Abnormal; Notable for the following:    Lymphs Abs 5.0 (*)    All other components within normal limits  COMPREHENSIVE METABOLIC PANEL - Abnormal; Notable for the following:    CO2 20 (*)    Glucose, Bld 141 (*)    Creatinine, Ser 1.30 (*)    Calcium 8.4 (*)    GFR calc non Af Amer 60 (*)    All other components within normal limits  AMMONIA - Abnormal; Notable for the following:    Ammonia 36 (*)    All other components within normal limits  CBG MONITORING, ED - Abnormal; Notable for the following:    Glucose-Capillary 129 (*)    All other components within normal limits  I-STAT CHEM 8, ED - Abnormal; Notable for the following:    BUN 22 (*)    Creatinine, Ser 1.50 (*)    Glucose, Bld 140 (*)    Calcium, Ion 0.92 (*)    All other components within normal limits  PROTIME-INR  APTT  MAGNESIUM  PHOSPHORUS  URINE RAPID DRUG SCREEN, HOSP PERFORMED  ETHANOL  I-STAT TROPOININ, ED  I-STAT CG4 LACTIC ACID, ED    EKG  EKG Interpretation  Date/Time:  Friday February 07 2016 14:02:34 EDT Ventricular Rate:  104 PR Interval:    QRS Duration: 106 QT Interval:  340 QTC Calculation: 448 R Axis:   -8 Text Interpretation:  Sinus tachycardia Low voltage, precordial leads  Borderline T abnormalities, inferior leads Confirmed by Hazle Coca [phone]) on 02/07/2016 2:13:17 PM       Radiology Mr Brain Ltd Wo Contrast  Result Date: 02/07/2016 CLINICAL DATA:  Code stroke. Acute left-sided weakness and slurred speech EXAM: MRI HEAD WITHOUT CONTRAST TECHNIQUE: Multiplanar, multiecho pulse sequences of the brain and surrounding structures were obtained without intravenous contrast. COMPARISON:  CT head Nov 07, 2015 FINDINGS: Ventricle size normal.  Cerebral volume normal. Negative for acute infarct. No significant chronic ischemic changes. Negative for demyelinating disease. Basal ganglia brainstem and cerebellum normal. Negative for  hemorrhage or fluid collection. Negative for mass or edema.  No shift of the midline structures. Circle of Willis patent. Normal orbital structures.  Paranasal sinuses clear. Pituitary normal in size. IMPRESSION: Normal for age. Electronically Signed   By: Franchot Gallo M.D.   On: 02/07/2016 15:20   Ct Head Code Stroke W/o Cm  Result Date: 02/07/2016 CLINICAL DATA:  Code stroke. Acute left-sided weakness. Slurred speech. Last seen 15 minutes prior to arrival. EXAM: CT HEAD WITHOUT CONTRAST TECHNIQUE: Contiguous axial images were obtained from the base of the skull through the vertex without intravenous contrast. COMPARISON:  CT neck 05/06/2010 FINDINGS: No acute infarct, hemorrhage, or mass lesion is present. The ventricles are of normal size. The vessels are somewhat dense throughout but symmetric. The basal ganglia and insular cortex are normal bilaterally. No focal cortical lesion is present. Scattered subcortical white matter hypoattenuation is present bilaterally. No significant extra-axial fluid collection is present. The paranasal sinuses and mastoid air cells are clear. The calvarium is intact. No significant extracranial soft tissue lesions are present. The globes and orbits are within normal limits. ASPECTS [facility] Stroke Program Early CT  Score, http://www.aspectsinstroke.com) - Ganglionic level infarction (caudate, lentiform nuclei, internal capsule, insula, M1-M3 cortex): 7/7 - Supraganglionic infarction (M4-M6 cortex): 3/3 Total score (0-10 with 10 being normal): 10/10 IMPRESSION: 1. No acute intracranial abnormality. 2. Scattered subcortical white matter hypoattenuation suggesting chronic microvascular ischemic change, advanced for age. 3. ASPECTS score 10/10 These results were called by telephone at the time of interpretation on 02/07/2016 at 2:12 pm to Drs. Alvina Chou, who verbally acknowledged these results. Electronically Signed   By: San Morelle M.D.   On: 02/07/2016 14:12    Procedures Procedures (including critical care time)  Medications Ordered in ED Medications  sodium chloride 0.9 % bolus 1,000 mL (1,000 mLs Intravenous New Bag/Given 02/07/16 1625)     Initial Impression / Assessment and Plan / ED Course  I have reviewed the triage vital signs and the nursing notes.  Pertinent labs & imaging results that were available during my care of the patient were reviewed by me and considered in my medical decision making (see chart for details).  Clinical Course  Comment By Time  Patient whisked away to CT which ws negative. In shared physical exam patient has BL UE drift and weakness with abnormal F-N He has dysesthesia of the left thigh and weakness with plantar and dorsiflexion on the left Margarita Mail, PA-C 08/11 1427  Patient sent for emergent MRI Margarita Mail, PA-C 08/11 1430  Patient MRI negative. I discussed the case with the PA (DAVE) from Neurohospitalist- question complex partial seizure, TME.  Margarita Mail, PA-C 08/11 1614  Pt will be admitted to the hospitalist service. He apparently had heavy diarrhea yearterday. Suspect some level of dehydration. Will receive inpatient EEG. Margarita Mail, PA-C 08/11 1625      Final Clinical Impressions(s) / ED Diagnoses   Final diagnoses:    Transient alteration of awareness  Left-sided weakness    New Prescriptions New Prescriptions   No medications on file     Margarita Mail, PA-C 02/07/16 [place], MD 02/08/16 [phone]

## 2016-02-07 NOTE — ED Notes (Signed)
Attempted Report 

## 2016-02-07 NOTE — ED Notes (Signed)
Attempted to call report to [facility] and they denied because they wanted pt. On a stroke unit

## 2016-02-07 NOTE — ED Notes (Signed)
Abnormal CG-4 reported to Dr. Sabra Heck

## 2016-02-07 NOTE — ED Notes (Signed)
EEG called and because they get off at 5, EEG will be done tomorrow AM

## 2016-02-07 NOTE — ED Notes (Signed)
cbg taken, 129. RN notified.

## 2016-02-08 ENCOUNTER — Other Ambulatory Visit: Payer: Self-pay | Admitting: Family Medicine

## 2016-02-08 ENCOUNTER — Encounter (HOSPITAL_COMMUNITY): Payer: Self-pay | Admitting: *Deleted

## 2016-02-08 DIAGNOSIS — F1010 Alcohol abuse, uncomplicated: Secondary | ICD-10-CM | POA: Diagnosis not present

## 2016-02-08 DIAGNOSIS — R569 Unspecified convulsions: Secondary | ICD-10-CM | POA: Diagnosis not present

## 2016-02-08 DIAGNOSIS — E669 Obesity, unspecified: Secondary | ICD-10-CM

## 2016-02-08 LAB — CBC
HEMATOCRIT: 44 % (ref 39.0–52.0)
HEMOGLOBIN: 14.7 g/dL (ref 13.0–17.0)
MCH: 28.5 pg (ref 26.0–34.0)
MCHC: 33.4 g/dL (ref 30.0–36.0)
MCV: 85.4 fL (ref 78.0–100.0)
Platelets: 305 10*3/uL (ref 150–400)
RBC: 5.15 MIL/uL (ref 4.22–5.81)
RDW: 13.3 % (ref 11.5–15.5)
WBC: 11.9 10*3/uL — ABNORMAL HIGH (ref 4.0–10.5)

## 2016-02-08 LAB — BASIC METABOLIC PANEL
ANION GAP: 11 (ref 5–15)
BUN: 17 mg/dL (ref 6–20)
CALCIUM: 8.1 mg/dL — AB (ref 8.9–10.3)
CHLORIDE: 103 mmol/L (ref 101–111)
CO2: 22 mmol/L (ref 22–32)
Creatinine, Ser: 1.04 mg/dL (ref 0.61–1.24)
GFR calc non Af Amer: >60 mL/min (ref >60)
GLUCOSE: 101 mg/dL — AB (ref 65–99)
Potassium: 4.3 mmol/L (ref 3.5–5.1)
Sodium: 136 mmol/L (ref 135–145)

## 2016-02-08 LAB — RAPID URINE DRUG SCREEN, HOSP PERFORMED
Amphetamines: NOT DETECTED
Barbiturates: NOT DETECTED
Benzodiazepines: NOT DETECTED
Cocaine: NOT DETECTED
Opiates: NOT DETECTED
Tetrahydrocannabinol: NOT DETECTED

## 2016-02-08 LAB — LACTIC ACID, PLASMA: Lactic Acid, Venous: 2.1 mmol/L (ref 0.5–1.9)

## 2016-02-08 NOTE — Progress Notes (Signed)
No orders were placed regarding last lactic acid. Notified Lynch to make her aware. Will continue to monitor

## 2016-02-08 NOTE — Discharge Summary (Signed)
Physician Discharge Summary  Jackson Sherman [id number] DOB: [birth year] DOA: 02/07/2016  PCP: Beatrice Lecher, MD  Admit date: 02/07/2016 Discharge date: 02/08/2016   Recommendations for Outpatient Follow-Up:   1. Needs inpatient treatment 2. Needs optho routine check up 3. ? Referral to psychologist--- relapsed this time after having remembering some episodes from his previous occupation as a Social research officer, government   Discharge Diagnosis:   Active Problems:   OBESITY, NOS   Altered mental status   Seizure ([place])   Alcohol abuse   Discharge disposition:  Home.  Discharge Condition: Improved.  Diet recommendation: Low sodium, heart healthy.  Carbohydrate-modified.   Wound care: None.   History of Present Illness:   Jackson Sherman is a 55 y.o. male with a medical history significant for, but not  limited to, hypertension and hyperlipidemia. Patient works as a Counsellor for [employer] . He was at work today when a coworker found him confused, drooling from left side of his face. Upon arrival to the ED patient was still confused. No loss of bowel or bladder function . Patient does not remember anything except riding in the ambulance. He felt completely fine this morning though yesterday he had several bouts of diarrhea. . No history of seizures, TIAs / CVA. Patient has never had an episode like this before. He has not recently started nor discontinued any medications. No alcohol use. Patient does describe visual disturbances that occur about once a month while at work. Usually left-sided, patient will get a colorful Prizm circling his left eye. Symptoms last for about 2 hours but don't evolve into a migraine. Last episode of visual disturbance was approximately 2 weeks ago. He never has any associated weakness, slurred speech or confusion associated with these visual disturbances   Hospital Course by Problem:   Acute alcohol intoxication with seizures -needs inpatient  treatment -social worker provided information, wife at bedside well aware of places as she is an Therapist, sports -MRI negative -EEG not done (there is a reading on the chart but apparently was not for this patient)  HTN -resume home meds    Medical Consultants:    Neuro   Discharge Exam:   Vitals:   02/08/16 0540 02/08/16 1149  BP: (!) 151/92 (!) 158/87  Pulse: 88 84  Resp: 16 20  Temp: 99.1 F (37.3 C) 98.8 F (37.1 C)   Vitals:   02/08/16 0033 02/08/16 0230 02/08/16 0540 02/08/16 1149  BP: 119/62  (!) 151/92 (!) 158/87  Pulse: 99  88 84  Resp: 16  16 20   Temp: 98.3 F (36.8 C)  99.1 F (37.3 C) 98.8 F (37.1 C)  TempSrc:    Oral  SpO2: 95%  97% 97%  Weight:  119.9 kg (264 lb 5.3 oz)    Height:        Gen:  NAD- feeling much better, wants to go home   The results of significant diagnostics from this hospitalization (including imaging, microbiology, ancillary and laboratory) are listed below for reference.     Procedures and Diagnostic Studies:   Mr Brain Ltd Wo Contrast  Result Date: 02/07/2016 CLINICAL DATA:  Code stroke. Acute left-sided weakness and slurred speech EXAM: MRI HEAD WITHOUT CONTRAST TECHNIQUE: Multiplanar, multiecho pulse sequences of the brain and surrounding structures were obtained without intravenous contrast. COMPARISON:  CT head Nov 07, 2015 FINDINGS: Ventricle size normal.  Cerebral volume normal. Negative for acute infarct. No significant chronic ischemic changes. Negative for demyelinating disease. Basal ganglia brainstem and cerebellum normal. Negative  for hemorrhage or fluid collection. Negative for mass or edema.  No shift of the midline structures. Circle of Willis patent. Normal orbital structures.  Paranasal sinuses clear. Pituitary normal in size. IMPRESSION: Normal for age. Electronically Signed   By: Franchot Gallo M.D.   On: 02/07/2016 15:20   Ct Head Code Stroke W/o Cm  Result Date: 02/07/2016 CLINICAL DATA:  Code stroke. Acute  left-sided weakness. Slurred speech. Last seen 15 minutes prior to arrival. EXAM: CT HEAD WITHOUT CONTRAST TECHNIQUE: Contiguous axial images were obtained from the base of the skull through the vertex without intravenous contrast. COMPARISON:  CT neck 05/06/2010 FINDINGS: No acute infarct, hemorrhage, or mass lesion is present. The ventricles are of normal size. The vessels are somewhat dense throughout but symmetric. The basal ganglia and insular cortex are normal bilaterally. No focal cortical lesion is present. Scattered subcortical white matter hypoattenuation is present bilaterally. No significant extra-axial fluid collection is present. The paranasal sinuses and mastoid air cells are clear. The calvarium is intact. No significant extracranial soft tissue lesions are present. The globes and orbits are within normal limits. ASPECTS [facility] Stroke Program Early CT Score, http://www.aspectsinstroke.com) - Ganglionic level infarction (caudate, lentiform nuclei, internal capsule, insula, M1-M3 cortex): 7/7 - Supraganglionic infarction (M4-M6 cortex): 3/3 Total score (0-10 with 10 being normal): 10/10 IMPRESSION: 1. No acute intracranial abnormality. 2. Scattered subcortical white matter hypoattenuation suggesting chronic microvascular ischemic change, advanced for age. 3. ASPECTS score 10/10 These results were called by telephone at the time of interpretation on 02/07/2016 at 2:12 pm to Drs. Alvina Chou, who verbally acknowledged these results. Electronically Signed   By: San Morelle M.D.   On: 02/07/2016 14:12     Labs:   Basic Metabolic Panel:  Recent Labs Lab 02/07/16 1359 02/07/16 1411 02/07/16 1520 02/08/16 0719  NA 137 139  --  136  K 4.2 4.2  --  4.3  CL 104 105  --  103  CO2 20*  --   --  22  GLUCOSE 141* 140*  --  101*  BUN 20 22*  --  17  CREATININE 1.30* 1.50*  --  1.04  CALCIUM 8.4*  --   --  8.1*  MG  --   --  2.2  --   PHOS  --   --  3.6  --    GFR Estimated  Creatinine Clearance: 106 mL/min (by C-G formula based on SCr of 1.04 mg/dL). Liver Function Tests:  Recent Labs Lab 02/07/16 1359  AST 26  ALT 29  ALKPHOS 70  BILITOT 0.5  PROT 6.9  ALBUMIN 3.6   No results for input(s): LIPASE, AMYLASE in the last 168 hours.  Recent Labs Lab 02/07/16 1530  AMMONIA 36*   Coagulation profile  Recent Labs Lab 02/07/16 1359  INR 1.00    CBC:  Recent Labs Lab 02/07/16 1359 02/07/16 1411 02/08/16 0719  WBC 12.9*  --  11.9*  NEUTROABS 6.9  --   --   HGB 15.4 15.6 14.7  HCT 44.8 46.0 44.0  MCV 85.5  --  85.4  PLT 346  --  305   Cardiac Enzymes: No results for input(s): CKTOTAL, CKMB, CKMBINDEX, TROPONINI in the last 168 hours. BNP: Invalid input(s): POCBNP CBG:  Recent Labs Lab 02/07/16 1408  GLUCAP 129*   D-Dimer No results for input(s): DDIMER in the last 72 hours. Hgb A1c No results for input(s): HGBA1C in the last 72 hours. Lipid Profile No results for input(s):  CHOL, HDL, LDLCALC, TRIG, CHOLHDL, LDLDIRECT in the last 72 hours. Thyroid function studies No results for input(s): TSH, T4TOTAL, T3FREE, THYROIDAB in the last 72 hours.  Invalid input(s): FREET3 Anemia work up No results for input(s): VITAMINB12, FOLATE, FERRITIN, TIBC, IRON, RETICCTPCT in the last 72 hours. Microbiology No results found for this or any previous visit (from the past 240 hour(s)).   Discharge Instructions:   Discharge Instructions    Diet - low sodium heart healthy    Complete by:  As directed   Discharge instructions    Complete by:  As directed   Would recommend inpatient treatment for alcohol abuse No driving until seen by PCP -would get appointmnt with optometry/opthamology for eye issues first and then can be seen by neurology as an outpatient Drink plenty of water   Increase activity slowly    Complete by:  As directed       Medication List    STOP taking these medications   ALEVE 220 MG tablet Generic drug:  naproxen  sodium   HYDROcodone-acetaminophen 5-325 MG tablet Commonly known as:  NORCO/VICODIN     TAKE these medications   atorvastatin 20 MG tablet Commonly known as:  LIPITOR TAKE ONE TABLET BY MOUTH ONCE DAILY AT 6 PM What changed:  See the new instructions.   escitalopram 10 MG tablet Commonly known as:  LEXAPRO Take 1 tablet (10 mg total) by mouth daily.   lisinopril 20 MG tablet Commonly known as:  PRINIVIL,ZESTRIL TAKE ONE TABLET BY MOUTH ONCE DAILY BEFORE BREAKFAST   loratadine 10 MG tablet Commonly known as:  CLARITIN Take 10 mg by mouth daily.   NEXIUM PO Take 22.3 mg by mouth daily.   traZODone 50 MG tablet Commonly known as:  DESYREL TAKE ONE-HALF TO TWO TABLETS BY MOUTH ONCE DAILY AT BEDTIME AS NEEDED FOR SLEEP What changed:  See the new instructions.      Follow-up Information    METHENEY,CATHERINE, MD Follow up in 1 week(s).   Specialty:  Family Medicine Contact information: [id number] [place] [address] [phone]            Time coordinating discharge: 35 min  Signed:  Jolana Runkles Alison Stalling   [facility] 02/08/2016, 3:15 PM

## 2016-02-08 NOTE — Progress Notes (Signed)
Lactic acid 2.2. Baltazar Najjar made aware

## 2016-02-08 NOTE — Progress Notes (Signed)
Pt discharged to home, significant other at bedside. Discharge instructions taught to pt, pt understands via teachback. All questions answered. IV discontinued.
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
## 2016-02-10 ENCOUNTER — Other Ambulatory Visit: Payer: Self-pay

## 2016-02-10 ENCOUNTER — Telehealth: Payer: Self-pay | Admitting: Family Medicine

## 2016-02-10 DIAGNOSIS — E785 Hyperlipidemia, unspecified: Secondary | ICD-10-CM

## 2016-02-10 DIAGNOSIS — I10 Essential (primary) hypertension: Secondary | ICD-10-CM

## 2016-02-10 LAB — GLUCOSE, CAPILLARY: Glucose-Capillary: 154 mg/dL — ABNORMAL HIGH (ref 65–99)

## 2016-02-10 MED ORDER — LISINOPRIL 20 MG PO TABS: ORAL_TABLET | ORAL | 0 refills | Status: DC

## 2016-02-10 MED ORDER — ATORVASTATIN CALCIUM 20 MG PO TABS: ORAL_TABLET | ORAL | 0 refills | Status: DC

## 2016-02-10 MED ORDER — ESCITALOPRAM OXALATE 10 MG PO TABS: 10.0000 mg | ORAL_TABLET | Freq: Every day | ORAL | 0 refills | Status: DC

## 2016-02-10 NOTE — Telephone Encounter (Signed)
Pt added to schedule. Wife also state he needs a refill on his lexapro. This rx has not been written since 04/2015. Will route to PCP for review.

## 2016-02-10 NOTE — Telephone Encounter (Signed)
Wife called to schedule Pt a hospital f/u, first available is not for 2 weeks. Pt was seen in hospital for possible stroke, still have some right sided weakness. Will route to PCP to see if there is an earlier availability.

## 2016-02-10 NOTE — Telephone Encounter (Signed)
Can put him in Thurs at 1:15 and then hold 2:00 to catch up.

## 2016-02-10 NOTE — Addendum Note (Signed)
Addended by: Beatrice Lecher D on: 02/10/2016 12:26 PM   Modules accepted: Orders

## 2016-02-10 NOTE — Telephone Encounter (Signed)
Correction: left sided weakness

## 2016-02-13 ENCOUNTER — Ambulatory Visit (INDEPENDENT_AMBULATORY_CARE_PROVIDER_SITE_OTHER): Payer: Managed Care, Other (non HMO) | Admitting: Family Medicine

## 2016-02-13 ENCOUNTER — Encounter: Payer: Self-pay | Admitting: Family Medicine

## 2016-02-13 VITALS — BP 126/81 | HR 88 | Resp 17 | Ht 72.0 in | Wt 264.0 lb

## 2016-02-13 DIAGNOSIS — R569 Unspecified convulsions: Secondary | ICD-10-CM | POA: Diagnosis not present

## 2016-02-13 DIAGNOSIS — H539 Unspecified visual disturbance: Secondary | ICD-10-CM | POA: Diagnosis not present

## 2016-02-13 NOTE — Progress Notes (Signed)
Subjective:    CC: Hospital F/U  HPI:   55 year old male with a prior history of alcohol abuse who comes in today for hospital follow-up. He had been sober for almost 14 months when his wife went on vacation. He decided to buy alcohol and started drinking. He did not give me an exact amount but said he started drinking heavily and even woke up in the middle the night and drank. He then went to work the next day and started to experience strokelike symptoms. He was rushed to the emergency department. CT and MRI were negative. Alcohol levels were elevated. They felt that he may have had a alcohol induced seizure. He has dissipated in outpatient therapy 3 times prior. He said he did go to an [place] meeting last night and has not had a drink since prior to hospitalization. He did talk to an old counselor on Monday as well. He and his wife are interested in possibly having a neurology consult. He has no prior seizure disorder. He does report that he's had some new symptoms in his left eye over the last several months. Maybe even up to week year. He says on part of his eye he will see what looks almost like a prism. Always affects the left eye and just last for a few minutes. It's never followed by headaches. No prior migraine history. He has looked into previous treatment at [facility] but his insurance has not covered it. His wife has not checked recently.  Hypertension- Pt denies chest pain, SOB, dizziness, or heart palpitations.  Taking meds as directed w/o problems.  Denies medication side effects.      Past medical history, Surgical history, Family history not pertinant except as noted below, Social history, Allergies, and medications have been entered into the medical record, reviewed, and corrections made.   Review of Systems: No fevers, chills, night sweats, weight loss, chest pain, or shortness of breath.   Objective:    General: Well Developed, well nourished, and in no acute distress.   Neuro: Alert and oriented x3, extra-ocular muscles intact, sensation grossly intact.  HEENT: Normocephalic, atraumatic  Skin: Warm and dry, no rashes. Cardiac: Regular rate and rhythm, no murmurs rubs or gallops, no lower extremity edema.  Respiratory: Clear to auscultation bilaterally. Not using accessory muscles, speaking in full sentences.   Impression and Recommendations:    Alcohol abuse-encouraged him to check and at [facility]. I think you be a great candidate for an inpatient program. I did go ahead and write him out for the next 2 weeks of that he can attend AA meetings and at least get in with his old counselor a couple of times a week while he investigate inpatient programs. Next  Possible seizure, possibly call induced-we'll refer to neurology for consult. Consult his routine and not urgent. Next  Left eye symptoms-certainly could be an ocular migraine without headache.  Recommend full eye exam for further evaluation.  Calcium with sore low at discharge.

## 2016-02-14 ENCOUNTER — Telehealth: Payer: Self-pay

## 2016-02-14 LAB — BASIC METABOLIC PANEL WITH GFR
BUN: 24 mg/dL (ref 7–25)
CHLORIDE: 101 mmol/L (ref 98–110)
CO2: 23 mmol/L (ref 20–31)
CREATININE: 1.23 mg/dL (ref 0.70–1.33)
Calcium: 8.9 mg/dL (ref 8.6–10.3)
GFR, Est African American: 75 mL/min (ref >=60)
GFR, Est Non African American: 65 mL/min (ref >=60)
Glucose, Bld: 111 mg/dL — ABNORMAL HIGH (ref 65–99)
POTASSIUM: 4.7 mmol/L (ref 3.5–5.3)
SODIUM: 134 mmol/L — AB (ref 135–146)

## 2016-02-14 NOTE — Telephone Encounter (Signed)
La[facility]avious called and want to speak with Dr Madilyn Fireman. I asked if I could give her a message. He stated he was going to get treatment on Sunday. He did not give any other information.

## 2016-02-19 NOTE — Telephone Encounter (Signed)
lvm asking that pt rtn call with how much time is needed.Jackson Sherman [place]

## 2016-02-19 NOTE — Telephone Encounter (Signed)
LMOM on Monday AM, 02/17/16.  May need to call Rebekah Chesterfield his wife to find out how much time off for work for [employer] paperwork.

## 2016-02-21 NOTE — Telephone Encounter (Signed)
Spoke w/pt's wife and she said that the Dr at the rehab facility will fill out his FMLA forms. Maryruth Eve, [place] Crocker'

## 2016-02-24 ENCOUNTER — Ambulatory Visit
Admission: RE | Admit: 2016-02-24 | Discharge: 2016-02-24 | Disposition: A | Discharge status: Home / Self Care | Payer: No Typology Code available for payment source | Source: Ambulatory Visit | Attending: *Deleted | Admitting: *Deleted

## 2016-02-24 ENCOUNTER — Other Ambulatory Visit: Payer: Self-pay | Admitting: *Deleted

## 2016-02-24 DIAGNOSIS — R7611 Nonspecific reaction to tuberculin skin test without active tuberculosis: Secondary | ICD-10-CM

## 2016-02-25 ENCOUNTER — Telehealth: Payer: Self-pay | Admitting: *Deleted

## 2016-02-25 NOTE — Telephone Encounter (Signed)
fmla forms faxed,copy made and placed in scan. Confirmation received.Jackson Sherman [place]

## 2016-02-27 ENCOUNTER — Ambulatory Visit: Payer: Self-pay | Admitting: Family Medicine

## 2016-03-06 ENCOUNTER — Ambulatory Visit: Payer: Self-pay | Admitting: Diagnostic Neuroimaging

## 2016-03-16 ENCOUNTER — Encounter: Payer: Self-pay | Admitting: Family Medicine

## 2016-03-16 ENCOUNTER — Ambulatory Visit (INDEPENDENT_AMBULATORY_CARE_PROVIDER_SITE_OTHER): Payer: Managed Care, Other (non HMO) | Admitting: Family Medicine

## 2016-03-16 VITALS — BP 111/75 | HR 91 | Wt 265.0 lb

## 2016-03-16 DIAGNOSIS — F4310 Post-traumatic stress disorder, unspecified: Secondary | ICD-10-CM | POA: Diagnosis not present

## 2016-03-16 DIAGNOSIS — N529 Male erectile dysfunction, unspecified: Secondary | ICD-10-CM

## 2016-03-16 DIAGNOSIS — F1010 Alcohol abuse, uncomplicated: Secondary | ICD-10-CM

## 2016-03-16 MED ORDER — TADALAFIL 20 MG PO TABS: 10.0000 mg | ORAL_TABLET | ORAL | 0 refills | Status: DC | PRN

## 2016-03-16 MED ORDER — TRAZODONE HCL 100 MG PO TABS: 100.0000 mg | ORAL_TABLET | Freq: Every day | ORAL | 0 refills | Status: DC

## 2016-03-16 MED ORDER — ESCITALOPRAM OXALATE 20 MG PO TABS: 20.0000 mg | ORAL_TABLET | Freq: Every day | ORAL | 0 refills | Status: DC

## 2016-03-16 NOTE — Progress Notes (Signed)
Subjective:    CC: [place] abuse.   HPI: 55 year old male is here today to follow-up for recent 3 week inpatient treatment program for alcohol abuse.  Pt reports that some of his medications have been changed to higher doses. Increased his Lexapro to 20 mg and increased his trazodone at bedtime 200 mg. also he has started taking naltrexone 50mg . he would like to switch this to something else or get off of it all together. He is worried that if he gets into some type of accident that he will not be able to be given pain medication while taking the naltrexone. He's very concerned about this and would like to consider switching to gabapentin which he has used before. He is going to outpatient rehabilitation 3 days per week and then has a 1 hour private counseling session weekly. He also now has a new sponsor and says he's been feeling good about that. It's a retiree. ED- he's also been having some problems with erectile dysfunction over the last year. His wife is here with him today to discuss this. He sentences become more difficult to maintain an erection. Has never tried medication or treatment for. He wonders if it could be caused by his lisinopril 20 mg tablet he takes daily. He says his was also diagnosed with PTSD by the psychiatrist there.  Patient also complains of erectile dysfunction. He has difficulty maintaining erection and completing intercourse. He has never taken prescription medication for this before. He says it's been a concern for at least a year if not longer. He does complain of decreased libido as well as low mood.  Past medical history, Surgical history, Family history not pertinant except as noted below, Social history, Allergies, and medications have been entered into the medical record, reviewed, and corrections made.   Review of Systems: No fevers, chills, night sweats, weight loss, chest pain, or shortness of breath.   Objective:    General: Well Developed, well nourished,  and in no acute distress.  Neuro: Alert and oriented x3, extra-ocular muscles intact, sensation grossly intact.  HEENT: Normocephalic, atraumatic  Skin: Warm and dry, no rashes. Cardiac: Regular rate and rhythm, no murmurs rubs or gallops, no lower extremity edema.  Respiratory: Clear to auscultation bilaterally. Not using accessory muscles, speaking in full sentences.   Impression and Recommendations:   Alcohol abuse-continue with 3 day per week outpatient therapy as well as 1 hour private counseling sessions. Glad he has a new sponsor who he feels like he is relating well to.Continue with regular therapy sessions. I will look into whether gabapentin or the naltrexone is the best choice.  PTSD-New Dx.  encouraged to continue with therapy. Now increased his Lexapro 20 mg. Continue to trazodone 100 mg at bedtime as well. Follow-up in 2 months. He would prefer to switch to gabapentin. I just want to check some information to make sure that at least is as equally effective as the naltrexone before we switch.  Erectile dysfunction-ED score of  8. Discussed options. Can try ED medication. Given rx and coupon card.  Will try Cialis. 3 tabs given with a coupon card. Assessor on questionnaire was negative. Score of 4 out of 10 questions positive for low testosterone side don't think this is a major concern right now.

## 2016-03-17 ENCOUNTER — Telehealth: Payer: Self-pay | Admitting: Family Medicine

## 2016-03-17 NOTE — Telephone Encounter (Signed)
Please call patient. I was able to find some information that the gabapentin and the naltrexone are comparable. One was not proven to be better than the other. If he would like to switch please let me know.

## 2016-03-18 MED ORDER — GABAPENTIN 100 MG PO CAPS: ORAL_CAPSULE | ORAL | 3 refills | Status: DC

## 2016-03-18 NOTE — Telephone Encounter (Signed)
Spoke with Pt, he would like to switch from naltrexone to gabapentin. Will route to PCP for new Rx. Pharmacy on file is correct.

## 2016-03-18 NOTE — Telephone Encounter (Signed)
New rx sent

## 2016-03-19 ENCOUNTER — Other Ambulatory Visit: Payer: Self-pay | Admitting: Family Medicine

## 2016-03-19 MED ORDER — TADALAFIL 20 MG PO TABS: 10.0000 mg | ORAL_TABLET | ORAL | 99 refills | Status: DC | PRN

## 2016-03-19 NOTE — Progress Notes (Signed)
Would like cialis rx re-written for 10 tabs to maximize the coupon he has.

## 2016-04-01 ENCOUNTER — Telehealth: Payer: Self-pay

## 2016-04-01 MED ORDER — GABAPENTIN 300 MG PO CAPS: 300.0000 mg | ORAL_CAPSULE | Freq: Three times a day (TID) | ORAL | 5 refills | Status: DC

## 2016-04-01 NOTE — Telephone Encounter (Signed)
Pt called stating that another provider told him that the preferred dose of gabapentin for alcohol cravings/withdraw is 300 mg TID.  He would like to know what are your thoughts. Please advise.

## 2016-04-01 NOTE — Telephone Encounter (Signed)
WE can definitely go up if he would like.  So when someone is acutely going through withdrawal that first week the standard dose is 300mg  TID but there really hasn't been an established dose for maintanence treatment.  You do have to taper up though.  Can increase his 100mg  to TID x 3 days then increase to 200mg  in AM, 1 at noon, and then 200mg  at bedtime for 3 days, then 200mg  TID x 3 days and then I will send in new script for 300mg  TID.

## 2016-04-02 NOTE — Telephone Encounter (Signed)
Pt advised. He will do the taper up to 300mg  TID. Pt was able to provide correct read back on taper instructions. Advised to contact clinic for any questions/concerns. Verbalized understanding.

## 2016-04-09 DIAGNOSIS — G8929 Other chronic pain: Secondary | ICD-10-CM | POA: Insufficient documentation

## 2016-04-09 DIAGNOSIS — M25562 Pain in left knee: Secondary | ICD-10-CM

## 2016-04-13 ENCOUNTER — Telehealth: Payer: Self-pay

## 2016-04-13 NOTE — Telephone Encounter (Signed)
Jackson Sherman called to see if we have faxed his surgical clearance form for his knee replacement by Dr. Rudean Haskell [facility]. He states he dropped off the paperwork last Thursday. I could not find the clearance form.

## 2016-04-13 NOTE — Telephone Encounter (Signed)
I haven't seen it either. Let's call their offices and see if they can fax.

## 2016-04-14 NOTE — Telephone Encounter (Signed)
Called Dr Ruel Favors office and they will fax the form over.

## 2016-04-15 NOTE — Telephone Encounter (Signed)
lvm asking for rtn call about the type of clearance pt will need and the type of surgery, when surgery will happen. Asked that this information be provided so that we can complete form.Jackson Sherman

## 2016-04-20 NOTE — Telephone Encounter (Signed)
Brenn called today to find out if we were done with the surgical clearance. He is having a knee replacement.

## 2016-04-21 ENCOUNTER — Other Ambulatory Visit: Payer: Self-pay | Admitting: Family Medicine

## 2016-04-21 DIAGNOSIS — E785 Hyperlipidemia, unspecified: Secondary | ICD-10-CM

## 2016-04-21 NOTE — Telephone Encounter (Signed)
Letter written and faxed.Jackson Sherman

## 2016-04-30 ENCOUNTER — Other Ambulatory Visit: Payer: Self-pay | Admitting: Orthopedic Surgery

## 2016-05-19 ENCOUNTER — Ambulatory Visit (INDEPENDENT_AMBULATORY_CARE_PROVIDER_SITE_OTHER): Payer: Managed Care, Other (non HMO) | Admitting: Family Medicine

## 2016-05-19 ENCOUNTER — Encounter: Payer: Self-pay | Admitting: Family Medicine

## 2016-05-19 VITALS — BP 131/69 | HR 105 | Ht 72.0 in | Wt 267.0 lb

## 2016-05-19 DIAGNOSIS — F1010 Alcohol abuse, uncomplicated: Secondary | ICD-10-CM

## 2016-05-19 DIAGNOSIS — F4310 Post-traumatic stress disorder, unspecified: Secondary | ICD-10-CM | POA: Diagnosis not present

## 2016-05-19 NOTE — Progress Notes (Signed)
Subjective:    CC: PTSD  HPI:  Alcohols abuse - currently on gabapentin.  He is still going to [place] every night that he is not working and sometimes will even go during his lunch. He is tolerating the gabapentin well. He denies any side effects. He says it was a little sedating when he first started it but otherwise he feels like it's working well. He sleeping well to Adderall.  PTSD - On lexapro 20mg  and trazodone 100mg  at bedtime.  Doing well with his current regimen. He is still going to IOP plan at the [facility]. In fact he starts his outpatient therapy session today. He does do complain of feeling down several days of the week but not more than half. He also complains of low energy and some difficulty concentrating. Though at work he says he is unable to concentrate very well.  Past medical history, Surgical history, Family history not pertinant except as noted below, Social history, Allergies, and medications have been entered into the medical record, reviewed, and corrections made.   Review of Systems: No fevers, chills, night sweats, weight loss, chest pain, or shortness of breath.   Objective:    General: Well Developed, well nourished, and in no acute distress.  Neuro: Alert and oriented x3, extra-ocular muscles intact, sensation grossly intact.  HEENT: Normocephalic, atraumatic  Skin: Warm and dry, no rashes. Cardiac: Regular rate and rhythm, no murmurs rubs or gallops, no lower extremity edema.  Respiratory: Clear to auscultation bilaterally. Not using accessory muscles, speaking in full sentences.   Impression and Recommendations:    Alcohol abuse - Still currently sober. Continue with AA meetings and outpatient therapy. So far he is doing fantastic. Continue gabapentin. Follow-up in 8 weeks.  PTSD - starging outpatient therapy. PHQ 9 score of 8 today. Continue with Lexapro area

## 2016-06-04 NOTE — Pre-Procedure Instructions (Signed)
Jackson Sherman  06/04/2016      [facility] - [place], [place] - [address] [place] [address] Phone: [phone] Fax: [phone]    Your procedure is scheduled on Dec 12.  Report to [facility] Admitting at 715 A.M.  Call this number if you have problems the morning of surgery:  [phone]   Remember:  Do not eat food or drink liquids after midnight.  Take these medicines the morning of surgery with A SIP OF WATER escitalopram (Lexapro), esomeprazole Magnesium (Nexium), gabapentin (Neurontin), Loratadine (Claritin)  Stop taking aspirin, BC's, Goody's, Herbal medications, Fish Oil, Aleve, Ibuprofen, Advil, Motrin   Do not wear jewelry, make-up or nail polish.  Do not wear lotions, powders, or perfumes, or deoderant.  Do not shave 48 hours prior to surgery.  Men may shave face and neck.  Do not bring valuables to the hospital.  [facility] is not responsible for any belongings or valuables.  Contacts, dentures or bridgework may not be worn into surgery.  Leave your suitcase in the car.  After surgery it may be brought to your room.  For patients admitted to the hospital, discharge time will be determined by your treatment team.  Patients discharged the day of surgery will not be allowed to drive home.   Special instructions:  [facility] - Preparing for Surgery  Before surgery, you can play an important role.  Because skin is not sterile, your skin needs to be as free of germs as possible.  You can reduce the number of germs on you skin by washing with CHG (chlorahexidine gluconate) soap before surgery.  CHG is an antiseptic cleaner which kills germs and bonds with the skin to continue killing germs even after washing.  Please DO NOT use if you have an allergy to CHG or antibacterial soaps.  If your skin becomes reddened/irritated stop using the CHG and inform your nurse when you arrive at Short Stay.  Do not  shave (including legs and underarms) for at least 48 hours prior to the first CHG shower.  You may shave your face.  Please follow these instructions carefully:   1.  Shower with CHG Soap the night before surgery and the   morning of Surgery.  2.  If you choose to wash your hair, wash your hair first as usual with your  normal shampoo.  3.  After you shampoo, rinse your hair and body thoroughly to remove the  Shampoo.  4.  Use CHG as you would any other liquid soap.  You can apply chg directly  to the skin and wash gently with scrungie or a clean washcloth.  5.  Apply the CHG Soap to your body ONLY FROM THE NECK DOWN.   Do not use on open wounds or open sores.  Avoid contact with your eyes,  ears, mouth and genitals (private parts).  Wash genitals (private parts)   with your normal soap.  6.  Wash thoroughly, paying special attention to the area where your surgery will be performed.  7.  Thoroughly rinse your body with warm water from the neck down.  8.  DO NOT shower/wash with your normal soap after using and rinsing off   the CHG Soap.  9.  Pat yourself dry with a clean towel.            10.  Wear clean pajamas.  11.  Place clean sheets on your bed the night of your first shower and do not sleep with pets.  Day of Surgery  Do not apply any lotions/deoderants the morning of surgery.  Please wear clean clothes to the hospital/surgery center.  '   Please read over the following fact sheets that you were given. Pain Booklet, MRSA Information, Surgical Site Infection Prevention and Anesthesia Post-op Instructions

## 2016-06-05 ENCOUNTER — Encounter (HOSPITAL_COMMUNITY)
Admission: RE | Admit: 2016-06-05 | Discharge: 2016-06-05 | Disposition: A | Discharge status: Home / Self Care | Payer: Managed Care, Other (non HMO) | Source: Ambulatory Visit | Attending: Orthopedic Surgery | Admitting: Orthopedic Surgery

## 2016-06-05 ENCOUNTER — Encounter (HOSPITAL_COMMUNITY): Payer: Self-pay

## 2016-06-05 DIAGNOSIS — Z01810 Encounter for preprocedural cardiovascular examination: Secondary | ICD-10-CM | POA: Insufficient documentation

## 2016-06-05 DIAGNOSIS — R9431 Abnormal electrocardiogram [ECG] [EKG]: Secondary | ICD-10-CM | POA: Diagnosis not present

## 2016-06-05 DIAGNOSIS — Z01812 Encounter for preprocedural laboratory examination: Secondary | ICD-10-CM | POA: Diagnosis present

## 2016-06-05 HISTORY — DX: Unspecified convulsions: R56.9

## 2016-06-05 LAB — COMPREHENSIVE METABOLIC PANEL
ALK PHOS: 68 U/L (ref 38–126)
ALT: 24 U/L (ref 17–63)
AST: 23 U/L (ref 15–41)
Albumin: 3.7 g/dL (ref 3.5–5.0)
Anion gap: 7 (ref 5–15)
BUN: 22 mg/dL — AB (ref 6–20)
CALCIUM: 8.9 mg/dL (ref 8.9–10.3)
CHLORIDE: 105 mmol/L (ref 101–111)
CO2: 23 mmol/L (ref 22–32)
CREATININE: 0.94 mg/dL (ref 0.61–1.24)
GFR calc non Af Amer: >60 mL/min (ref >60)
Glucose, Bld: 102 mg/dL — ABNORMAL HIGH (ref 65–99)
Potassium: 4.3 mmol/L (ref 3.5–5.1)
SODIUM: 135 mmol/L (ref 135–145)
Total Bilirubin: 0.5 mg/dL (ref 0.3–1.2)
Total Protein: 7.4 g/dL (ref 6.5–8.1)

## 2016-06-05 LAB — DIFFERENTIAL
BASOS ABS: 0.1 10*3/uL (ref 0.0–0.1)
Basophils Relative: 1 %
Eosinophils Absolute: 0.3 10*3/uL (ref 0.0–0.7)
Eosinophils Relative: 2 %
LYMPHS PCT: 36 %
Lymphs Abs: 3.8 10*3/uL (ref 0.7–4.0)
MONO ABS: 0.7 10*3/uL (ref 0.1–1.0)
MONOS PCT: 7 %
NEUTROS ABS: 5.7 10*3/uL (ref 1.7–7.7)
Neutrophils Relative %: 54 %

## 2016-06-05 LAB — SURGICAL PCR SCREEN
MRSA, PCR: POSITIVE — AB
STAPHYLOCOCCUS AUREUS: POSITIVE — AB

## 2016-06-05 LAB — CBC
HEMATOCRIT: 43.5 % (ref 39.0–52.0)
Hemoglobin: 15 g/dL (ref 13.0–17.0)
MCH: 29.5 pg (ref 26.0–34.0)
MCHC: 34.5 g/dL (ref 30.0–36.0)
MCV: 85.6 fL (ref 78.0–100.0)
PLATELETS: 264 10*3/uL (ref 150–400)
RBC: 5.08 MIL/uL (ref 4.22–5.81)
RDW: 13.8 % (ref 11.5–15.5)
WBC: 10.5 10*3/uL (ref 4.0–10.5)

## 2016-06-05 NOTE — Progress Notes (Signed)
Denies any cardiac issues and hasn't seen a cardio PCP is Dr. Candace Cruise  LOV 04/2016 He did say, that at one time, he was tested for TB, showed it was positive, but after repeat tests, the results were negative.  Come to find out he is 'allergic' to the TB injection Did have seizure awhile ago, but come to find out it was related to ETOH abuse.  None since and attends meetings.

## 2016-06-12 MED ORDER — CEFAZOLIN SODIUM-DEXTROSE 2-4 GM/100ML-% IV SOLN
2.0000 g | INTRAVENOUS | Status: AC
  Administered 2016-06-15: 2 g via INTRAVENOUS
  Filled 2016-06-12: qty 100

## 2016-06-12 MED ORDER — DEXAMETHASONE SODIUM PHOSPHATE 10 MG/ML IJ SOLN
8.0000 mg | Freq: Once | INTRAMUSCULAR | Status: AC
  Administered 2016-06-15: 8 mg via INTRAVENOUS
  Filled 2016-06-12: qty 1

## 2016-06-12 MED ORDER — TRANEXAMIC ACID 1000 MG/10ML IV SOLN
1000.0000 mg | INTRAVENOUS | Status: AC
  Administered 2016-06-15: 1000 mg via INTRAVENOUS
  Filled 2016-06-12: qty 10

## 2016-06-12 MED ORDER — GABAPENTIN 300 MG PO CAPS
300.0000 mg | ORAL_CAPSULE | Freq: Once | ORAL | Status: DC
  Filled 2016-06-12: qty 1

## 2016-06-12 MED ORDER — BUPIVACAINE LIPOSOME 1.3 % IJ SUSP
20.0000 mL | INTRAMUSCULAR | Status: AC
  Administered 2016-06-15: 20 mL
  Filled 2016-06-12: qty 20

## 2016-06-15 ENCOUNTER — Inpatient Hospital Stay (HOSPITAL_COMMUNITY)
Admission: RE | Admit: 2016-06-15 | Discharge: 2016-06-16 | DRG: 470 | Disposition: A | Discharge status: Home / Self Care | Payer: Managed Care, Other (non HMO) | Source: Ambulatory Visit | Attending: Orthopedic Surgery | Admitting: Orthopedic Surgery

## 2016-06-15 ENCOUNTER — Encounter (HOSPITAL_COMMUNITY)
Admission: RE | Disposition: A | Discharge status: Home / Self Care | Payer: Self-pay | Source: Ambulatory Visit | Attending: Orthopedic Surgery

## 2016-06-15 ENCOUNTER — Inpatient Hospital Stay (HOSPITAL_COMMUNITY): Payer: Managed Care, Other (non HMO) | Admitting: Anesthesiology

## 2016-06-15 ENCOUNTER — Encounter (HOSPITAL_COMMUNITY): Payer: Self-pay | Admitting: *Deleted

## 2016-06-15 DIAGNOSIS — Z8249 Family history of ischemic heart disease and other diseases of the circulatory system: Secondary | ICD-10-CM

## 2016-06-15 DIAGNOSIS — Z823 Family history of stroke: Secondary | ICD-10-CM

## 2016-06-15 DIAGNOSIS — R262 Difficulty in walking, not elsewhere classified: Secondary | ICD-10-CM

## 2016-06-15 DIAGNOSIS — Z803 Family history of malignant neoplasm of breast: Secondary | ICD-10-CM

## 2016-06-15 DIAGNOSIS — Z833 Family history of diabetes mellitus: Secondary | ICD-10-CM | POA: Diagnosis not present

## 2016-06-15 DIAGNOSIS — Z87891 Personal history of nicotine dependence: Secondary | ICD-10-CM | POA: Diagnosis not present

## 2016-06-15 DIAGNOSIS — M1712 Unilateral primary osteoarthritis, left knee: Principal | ICD-10-CM | POA: Diagnosis present

## 2016-06-15 DIAGNOSIS — I10 Essential (primary) hypertension: Secondary | ICD-10-CM | POA: Diagnosis present

## 2016-06-15 DIAGNOSIS — Z818 Family history of other mental and behavioral disorders: Secondary | ICD-10-CM

## 2016-06-15 DIAGNOSIS — M25662 Stiffness of left knee, not elsewhere classified: Secondary | ICD-10-CM

## 2016-06-15 DIAGNOSIS — Z96659 Presence of unspecified artificial knee joint: Secondary | ICD-10-CM

## 2016-06-15 DIAGNOSIS — K219 Gastro-esophageal reflux disease without esophagitis: Secondary | ICD-10-CM | POA: Diagnosis present

## 2016-06-15 HISTORY — PX: TOTAL KNEE ARTHROPLASTY: SHX125

## 2016-06-15 LAB — CBC WITH DIFFERENTIAL/PLATELET
Basophils Absolute: 0.1 10*3/uL (ref 0.0–0.1)
Basophils Relative: 1 %
EOS PCT: 4 %
Eosinophils Absolute: 0.3 10*3/uL (ref 0.0–0.7)
HEMATOCRIT: 40.9 % (ref 39.0–52.0)
Hemoglobin: 13.7 g/dL (ref 13.0–17.0)
LYMPHS ABS: 3.1 10*3/uL (ref 0.7–4.0)
LYMPHS PCT: 33 %
MCH: 28.6 pg (ref 26.0–34.0)
MCHC: 33.5 g/dL (ref 30.0–36.0)
MCV: 85.4 fL (ref 78.0–100.0)
MONO ABS: 0.8 10*3/uL (ref 0.1–1.0)
MONOS PCT: 9 %
NEUTROS ABS: 4.9 10*3/uL (ref 1.7–7.7)
Neutrophils Relative %: 53 %
Platelets: 273 10*3/uL (ref 150–400)
RBC: 4.79 MIL/uL (ref 4.22–5.81)
RDW: 13.3 % (ref 11.5–15.5)
WBC: 9.2 10*3/uL (ref 4.0–10.5)

## 2016-06-15 LAB — COMPREHENSIVE METABOLIC PANEL
ALT: 23 U/L (ref 17–63)
ANION GAP: 10 (ref 5–15)
AST: 19 U/L (ref 15–41)
Albumin: 3.4 g/dL — ABNORMAL LOW (ref 3.5–5.0)
Alkaline Phosphatase: 63 U/L (ref 38–126)
BILIRUBIN TOTAL: 0.6 mg/dL (ref 0.3–1.2)
BUN: 25 mg/dL — AB (ref 6–20)
CO2: 23 mmol/L (ref 22–32)
Calcium: 8.4 mg/dL — ABNORMAL LOW (ref 8.9–10.3)
Chloride: 105 mmol/L (ref 101–111)
Creatinine, Ser: 1.05 mg/dL (ref 0.61–1.24)
Glucose, Bld: 106 mg/dL — ABNORMAL HIGH (ref 65–99)
POTASSIUM: 4.2 mmol/L (ref 3.5–5.1)
Sodium: 138 mmol/L (ref 135–145)
TOTAL PROTEIN: 6.4 g/dL — AB (ref 6.5–8.1)

## 2016-06-15 SURGERY — ARTHROPLASTY, KNEE, TOTAL
Anesthesia: Spinal | Site: Knee | Laterality: Left

## 2016-06-15 MED ORDER — ACETAMINOPHEN 500 MG PO TABS
1000.0000 mg | ORAL_TABLET | Freq: Once | ORAL | Status: AC
  Administered 2016-06-15: 1000 mg via ORAL
  Filled 2016-06-15: qty 2

## 2016-06-15 MED ORDER — FENTANYL CITRATE (PF) 100 MCG/2ML IJ SOLN
100.0000 ug | Freq: Once | INTRAMUSCULAR | Status: AC
  Administered 2016-06-15: 100 ug via INTRAVENOUS

## 2016-06-15 MED ORDER — DOCUSATE SODIUM 100 MG PO CAPS
100.0000 mg | ORAL_CAPSULE | Freq: Two times a day (BID) | ORAL | Status: DC
  Administered 2016-06-15 – 2016-06-16 (×3): 100 mg via ORAL
  Filled 2016-06-15 (×2): qty 1

## 2016-06-15 MED ORDER — ASPIRIN EC 325 MG PO TBEC
325.0000 mg | DELAYED_RELEASE_TABLET | Freq: Two times a day (BID) | ORAL | Status: DC
  Administered 2016-06-15 – 2016-06-16 (×3): 325 mg via ORAL
  Filled 2016-06-15 (×3): qty 1

## 2016-06-15 MED ORDER — ACETAMINOPHEN 650 MG RE SUPP: 650.0000 mg | Freq: Four times a day (QID) | RECTAL | Status: DC | PRN

## 2016-06-15 MED ORDER — OXYCODONE HCL ER 10 MG PO T12A
10.0000 mg | EXTENDED_RELEASE_TABLET | Freq: Two times a day (BID) | ORAL | Status: DC
  Administered 2016-06-15 – 2016-06-16 (×3): 10 mg via ORAL
  Filled 2016-06-15 (×3): qty 1

## 2016-06-15 MED ORDER — FENTANYL CITRATE (PF) 100 MCG/2ML IJ SOLN
INTRAMUSCULAR | Status: AC
  Administered 2016-06-15: 100 ug via INTRAVENOUS
  Filled 2016-06-15: qty 2

## 2016-06-15 MED ORDER — HYDROMORPHONE HCL 2 MG/ML IJ SOLN
1.0000 mg | INTRAMUSCULAR | Status: DC | PRN
  Administered 2016-06-15 – 2016-06-16 (×3): 1 mg via INTRAVENOUS
  Filled 2016-06-15 (×3): qty 1

## 2016-06-15 MED ORDER — CEFAZOLIN IN D5W 1 GM/50ML IV SOLN
1.0000 g | Freq: Four times a day (QID) | INTRAVENOUS | Status: AC
Start: 2016-06-15 — End: 2016-06-15
  Administered 2016-06-15 (×2): 1 g via INTRAVENOUS
  Filled 2016-06-15 (×2): qty 50

## 2016-06-15 MED ORDER — BUPIVACAINE-EPINEPHRINE (PF) 0.25% -1:200000 IJ SOLN
INTRAMUSCULAR | Status: DC | PRN
  Administered 2016-06-15: 30 mL via PERINEURAL

## 2016-06-15 MED ORDER — METHOCARBAMOL 1000 MG/10ML IJ SOLN
500.0000 mg | Freq: Four times a day (QID) | INTRAMUSCULAR | Status: DC | PRN
  Filled 2016-06-15: qty 5

## 2016-06-15 MED ORDER — ONDANSETRON HCL 4 MG/2ML IJ SOLN: 4.0000 mg | Freq: Four times a day (QID) | INTRAMUSCULAR | Status: DC | PRN

## 2016-06-15 MED ORDER — PANTOPRAZOLE SODIUM 40 MG PO TBEC
40.0000 mg | DELAYED_RELEASE_TABLET | Freq: Every day | ORAL | Status: DC
  Administered 2016-06-16: 40 mg via ORAL
  Filled 2016-06-15: qty 1

## 2016-06-15 MED ORDER — DEXAMETHASONE SODIUM PHOSPHATE 10 MG/ML IJ SOLN
10.0000 mg | Freq: Once | INTRAMUSCULAR | Status: AC
  Administered 2016-06-16: 10 mg via INTRAVENOUS
  Filled 2016-06-15: qty 1

## 2016-06-15 MED ORDER — PHENOL 1.4 % MT LIQD: 1.0000 | OROMUCOSAL | Status: DC | PRN

## 2016-06-15 MED ORDER — 0.9 % SODIUM CHLORIDE (POUR BTL) OPTIME
TOPICAL | Status: DC | PRN
  Administered 2016-06-15: 1000 mL

## 2016-06-15 MED ORDER — METHOCARBAMOL 500 MG PO TABS
500.0000 mg | ORAL_TABLET | Freq: Four times a day (QID) | ORAL | Status: DC | PRN
  Administered 2016-06-15 – 2016-06-16 (×3): 500 mg via ORAL
  Filled 2016-06-15 (×3): qty 1

## 2016-06-15 MED ORDER — GABAPENTIN 300 MG PO CAPS
300.0000 mg | ORAL_CAPSULE | Freq: Three times a day (TID) | ORAL | Status: DC
  Administered 2016-06-15 – 2016-06-16 (×3): 300 mg via ORAL
  Filled 2016-06-15 (×3): qty 1

## 2016-06-15 MED ORDER — ESCITALOPRAM OXALATE 10 MG PO TABS
20.0000 mg | ORAL_TABLET | Freq: Every day | ORAL | Status: DC
  Administered 2016-06-16: 20 mg via ORAL
  Filled 2016-06-15: qty 2

## 2016-06-15 MED ORDER — ALUM & MAG HYDROXIDE-SIMETH 200-200-20 MG/5ML PO SUSP: 30.0000 mL | ORAL | Status: DC | PRN

## 2016-06-15 MED ORDER — ZOLPIDEM TARTRATE 5 MG PO TABS: 5.0000 mg | ORAL_TABLET | Freq: Every evening | ORAL | Status: DC | PRN

## 2016-06-15 MED ORDER — PROPOFOL 500 MG/50ML IV EMUL
INTRAVENOUS | Status: DC | PRN
  Administered 2016-06-15: 25 ug/kg/min via INTRAVENOUS

## 2016-06-15 MED ORDER — LACTATED RINGERS IV SOLN
INTRAVENOUS | Status: DC | PRN
  Administered 2016-06-15 (×2): via INTRAVENOUS

## 2016-06-15 MED ORDER — LISINOPRIL 20 MG PO TABS
20.0000 mg | ORAL_TABLET | Freq: Every day | ORAL | Status: DC
  Administered 2016-06-16: 20 mg via ORAL
  Filled 2016-06-15: qty 1

## 2016-06-15 MED ORDER — ONDANSETRON HCL 4 MG PO TABS: 4.0000 mg | ORAL_TABLET | Freq: Four times a day (QID) | ORAL | Status: DC | PRN

## 2016-06-15 MED ORDER — TRAZODONE HCL 100 MG PO TABS
100.0000 mg | ORAL_TABLET | Freq: Every day | ORAL | Status: DC
  Administered 2016-06-15: 100 mg via ORAL
  Filled 2016-06-15: qty 1

## 2016-06-15 MED ORDER — ACETAMINOPHEN 325 MG PO TABS: 650.0000 mg | ORAL_TABLET | Freq: Four times a day (QID) | ORAL | Status: DC | PRN

## 2016-06-15 MED ORDER — CHLORHEXIDINE GLUCONATE 4 % EX LIQD: 60.0000 mL | Freq: Once | CUTANEOUS | Status: DC

## 2016-06-15 MED ORDER — SODIUM CHLORIDE 0.9 % IV SOLN
INTRAVENOUS | Status: DC
  Administered 2016-06-15: 14:00:00 via INTRAVENOUS

## 2016-06-15 MED ORDER — BISACODYL 5 MG PO TBEC: 5.0000 mg | DELAYED_RELEASE_TABLET | Freq: Every day | ORAL | Status: DC | PRN

## 2016-06-15 MED ORDER — ROPIVACAINE HCL 7.5 MG/ML IJ SOLN
INTRAMUSCULAR | Status: DC | PRN
  Administered 2016-06-15: 20 mL via PERINEURAL

## 2016-06-15 MED ORDER — ATORVASTATIN CALCIUM 20 MG PO TABS
20.0000 mg | ORAL_TABLET | Freq: Every day | ORAL | Status: DC
  Administered 2016-06-15: 20 mg via ORAL
  Filled 2016-06-15: qty 1

## 2016-06-15 MED ORDER — METOCLOPRAMIDE HCL 5 MG PO TABS
5.0000 mg | ORAL_TABLET | Freq: Three times a day (TID) | ORAL | Status: DC | PRN
Start: 2016-06-15 — End: 2016-06-16

## 2016-06-15 MED ORDER — BUPIVACAINE-EPINEPHRINE (PF) 0.25% -1:200000 IJ SOLN
INTRAMUSCULAR | Status: AC
  Filled 2016-06-15: qty 30

## 2016-06-15 MED ORDER — FENTANYL CITRATE (PF) 100 MCG/2ML IJ SOLN
INTRAMUSCULAR | Status: AC
  Filled 2016-06-15: qty 2

## 2016-06-15 MED ORDER — FENTANYL CITRATE (PF) 100 MCG/2ML IJ SOLN
INTRAMUSCULAR | Status: DC | PRN
  Administered 2016-06-15 (×2): 50 ug via INTRAVENOUS

## 2016-06-15 MED ORDER — METOCLOPRAMIDE HCL 5 MG/ML IJ SOLN: 5.0000 mg | Freq: Three times a day (TID) | INTRAMUSCULAR | Status: DC | PRN

## 2016-06-15 MED ORDER — TRANEXAMIC ACID 1000 MG/10ML IV SOLN
1000.0000 mg | Freq: Once | INTRAVENOUS | Status: AC
  Administered 2016-06-15: 1000 mg via INTRAVENOUS
  Filled 2016-06-15: qty 10

## 2016-06-15 MED ORDER — HYDROMORPHONE HCL 1 MG/ML IJ SOLN: 0.2500 mg | INTRAMUSCULAR | Status: DC | PRN

## 2016-06-15 MED ORDER — OXYCODONE HCL 5 MG PO TABS
5.0000 mg | ORAL_TABLET | ORAL | Status: DC | PRN
  Administered 2016-06-15 – 2016-06-16 (×4): 10 mg via ORAL
  Filled 2016-06-15 (×4): qty 2

## 2016-06-15 MED ORDER — SODIUM CHLORIDE 0.9 % IR SOLN
Status: DC | PRN
  Administered 2016-06-15: 3000 mL

## 2016-06-15 MED ORDER — CELECOXIB 200 MG PO CAPS
200.0000 mg | ORAL_CAPSULE | Freq: Two times a day (BID) | ORAL | Status: DC
  Administered 2016-06-15 – 2016-06-16 (×3): 200 mg via ORAL
  Filled 2016-06-15 (×3): qty 1

## 2016-06-15 MED ORDER — FLEET ENEMA 7-19 GM/118ML RE ENEM: 1.0000 | ENEMA | Freq: Once | RECTAL | Status: DC | PRN

## 2016-06-15 MED ORDER — LIDOCAINE HCL (CARDIAC) 20 MG/ML IV SOLN
INTRAVENOUS | Status: DC | PRN
  Administered 2016-06-15: 40 mg via INTRATRACHEAL

## 2016-06-15 MED ORDER — MIDAZOLAM HCL 5 MG/5ML IJ SOLN
INTRAMUSCULAR | Status: DC | PRN
  Administered 2016-06-15: 2 mg via INTRAVENOUS

## 2016-06-15 MED ORDER — DIPHENHYDRAMINE HCL 12.5 MG/5ML PO ELIX: 12.5000 mg | ORAL_SOLUTION | ORAL | Status: DC | PRN

## 2016-06-15 MED ORDER — MIDAZOLAM HCL 2 MG/2ML IJ SOLN
INTRAMUSCULAR | Status: AC
  Filled 2016-06-15: qty 2

## 2016-06-15 MED ORDER — DIPHENHYDRAMINE HCL 50 MG/ML IJ SOLN
INTRAMUSCULAR | Status: DC | PRN
  Administered 2016-06-15: 25 mg via INTRAVENOUS

## 2016-06-15 MED ORDER — MIDAZOLAM HCL 2 MG/2ML IJ SOLN
2.0000 mg | Freq: Once | INTRAMUSCULAR | Status: AC
  Administered 2016-06-15: 2 mg via INTRAVENOUS

## 2016-06-15 MED ORDER — SODIUM CHLORIDE 0.9 % IJ SOLN
INTRAMUSCULAR | Status: DC | PRN
  Administered 2016-06-15: 10 mL

## 2016-06-15 MED ORDER — SENNOSIDES-DOCUSATE SODIUM 8.6-50 MG PO TABS: 1.0000 | ORAL_TABLET | Freq: Every evening | ORAL | Status: DC | PRN

## 2016-06-15 MED ORDER — MIDAZOLAM HCL 2 MG/2ML IJ SOLN
INTRAMUSCULAR | Status: AC
  Administered 2016-06-15: 2 mg via INTRAVENOUS
  Filled 2016-06-15: qty 2

## 2016-06-15 MED ORDER — BUPIVACAINE IN DEXTROSE 0.75-8.25 % IT SOLN
INTRATHECAL | Status: DC | PRN
  Administered 2016-06-15: 15 mg via INTRATHECAL

## 2016-06-15 MED ORDER — MENTHOL 3 MG MT LOZG: 1.0000 | LOZENGE | OROMUCOSAL | Status: DC | PRN

## 2016-06-15 SURGICAL SUPPLY — 60 items
BANDAGE ACE 6X5 VEL STRL LF (GAUZE/BANDAGES/DRESSINGS) ×3 IMPLANT
BANDAGE ESMARK 6X9 LF (GAUZE/BANDAGES/DRESSINGS) ×1 IMPLANT
BLADE SAGITTAL 13X1.27X60 (BLADE) ×2 IMPLANT
BLADE SAGITTAL 13X1.27X60MM (BLADE) ×1
BLADE SAW SGTL 83.5X18.5 (BLADE) ×3 IMPLANT
BLADE SURG 10 STRL SS (BLADE) ×3 IMPLANT
BNDG CMPR 9X6 STRL LF SNTH (GAUZE/BANDAGES/DRESSINGS) ×1
BNDG ESMARK 6X9 LF (GAUZE/BANDAGES/DRESSINGS) ×3
BOWL SMART MIX CTS (DISPOSABLE) ×3 IMPLANT
CAPT KNEE TOTAL 3 ×3 IMPLANT
CEMENT BONE SIMPLEX SPEEDSET (Cement) ×6 IMPLANT
CLOSURE STERI-STRIP 1/2X4 (GAUZE/BANDAGES/DRESSINGS) ×1
CLOSURE WOUND 1/2 X4 (GAUZE/BANDAGES/DRESSINGS) ×1
CLSR STERI-STRIP ANTIMIC 1/2X4 (GAUZE/BANDAGES/DRESSINGS) ×1 IMPLANT
COVER SURGICAL LIGHT HANDLE (MISCELLANEOUS) ×3 IMPLANT
CUFF TOURNIQUET SINGLE 34IN LL (TOURNIQUET CUFF) ×3 IMPLANT
DRAPE EXTREMITY T 121X128X90 (DRAPE) ×3 IMPLANT
DRAPE HALF SHEET 40X57 (DRAPES) ×3 IMPLANT
DRAPE INCISE IOBAN 66X45 STRL (DRAPES) ×6 IMPLANT
DRAPE U-SHAPE 47X51 STRL (DRAPES) ×3 IMPLANT
DRSG AQUACEL AG ADV 3.5X10 (GAUZE/BANDAGES/DRESSINGS) ×3 IMPLANT
DURAPREP 26ML APPLICATOR (WOUND CARE) ×4 IMPLANT
ELECT REM PT RETURN 9FT ADLT (ELECTROSURGICAL) ×3
ELECTRODE REM PT RTRN 9FT ADLT (ELECTROSURGICAL) ×1 IMPLANT
GLOVE BIOGEL M 7.0 STRL (GLOVE) IMPLANT
GLOVE BIOGEL PI IND STRL 7.5 (GLOVE) IMPLANT
GLOVE BIOGEL PI IND STRL 8.5 (GLOVE) ×1 IMPLANT
GLOVE BIOGEL PI INDICATOR 7.5 (GLOVE)
GLOVE BIOGEL PI INDICATOR 8.5 (GLOVE) ×2
GLOVE SURG ORTHO 8.0 STRL STRW (GLOVE) ×6 IMPLANT
GOWN STRL REUS W/ TWL LRG LVL3 (GOWN DISPOSABLE) ×1 IMPLANT
GOWN STRL REUS W/ TWL XL LVL3 (GOWN DISPOSABLE) ×2 IMPLANT
GOWN STRL REUS W/TWL 2XL LVL3 (GOWN DISPOSABLE) ×3 IMPLANT
GOWN STRL REUS W/TWL LRG LVL3 (GOWN DISPOSABLE) ×3
GOWN STRL REUS W/TWL XL LVL3 (GOWN DISPOSABLE) ×6
HANDPIECE INTERPULSE COAX TIP (DISPOSABLE) ×3
HOOD PEEL AWAY FACE SHEILD DIS (HOOD) ×9 IMPLANT
KIT BASIN OR (CUSTOM PROCEDURE TRAY) ×3 IMPLANT
KIT ROOM TURNOVER OR (KITS) ×3 IMPLANT
KNEE CAPITATED TOTAL 3 IMPLANT
MANIFOLD NEPTUNE II (INSTRUMENTS) ×3 IMPLANT
NEEDLE 22X1 1/2 (OR ONLY) (NEEDLE) ×6 IMPLANT
NS IRRIG 1000ML POUR BTL (IV SOLUTION) ×3 IMPLANT
PACK TOTAL JOINT (CUSTOM PROCEDURE TRAY) ×3 IMPLANT
PAD ARMBOARD 7.5X6 YLW CONV (MISCELLANEOUS) ×6 IMPLANT
SET HNDPC FAN SPRY TIP SCT (DISPOSABLE) ×1 IMPLANT
STRIP CLOSURE SKIN 1/2X4 (GAUZE/BANDAGES/DRESSINGS) ×2 IMPLANT
SUCTION FRAZIER HANDLE 10FR (MISCELLANEOUS)
SUCTION TUBE FRAZIER 10FR DISP (MISCELLANEOUS) IMPLANT
SUT MNCRL AB 3-0 PS2 18 (SUTURE) ×3 IMPLANT
SUT VIC AB 0 CTB1 27 (SUTURE) ×6 IMPLANT
SUT VIC AB 1 CT1 27 (SUTURE) ×6
SUT VIC AB 1 CT1 27XBRD ANBCTR (SUTURE) ×2 IMPLANT
SUT VIC AB 2-0 CT1 27 (SUTURE) ×6
SUT VIC AB 2-0 CT1 TAPERPNT 27 (SUTURE) ×2 IMPLANT
SYR 20CC LL (SYRINGE) ×6 IMPLANT
TOWEL OR 17X24 6PK STRL BLUE (TOWEL DISPOSABLE) ×3 IMPLANT
TOWEL OR 17X26 10 PK STRL BLUE (TOWEL DISPOSABLE) ×3 IMPLANT
TRAY CATH 16FR W/PLASTIC CATH (SET/KITS/TRAYS/PACK) IMPLANT
WRAP KNEE MAXI GEL POST OP (GAUZE/BANDAGES/DRESSINGS) ×1 IMPLANT

## 2016-06-15 NOTE — Anesthesia Postprocedure Evaluation (Signed)
Anesthesia Post Note  Patient: Jackson Sherman  Procedure(s) Performed: Procedure(s) (LRB): LEFT TOTAL KNEE ARTHROPLASTY (Left)  Anesthesia Post Evaluation     Last Vitals:  Vitals:   06/15/16 1258 06/15/16 1300  BP: 119/77   Pulse: (!) 59 (!) 57  Resp: 14 14  Temp:  36.5 C    Last Pain:  Vitals:   06/15/16 0725  TempSrc: Oral                 Anedra Penafiel,W. EDMOND

## 2016-06-15 NOTE — Anesthesia Procedure Notes (Addendum)
Anesthesia Regional Block:  Adductor canal block  Pre-Anesthetic Checklist: ,, timeout performed, Correct Patient, Correct Site, Correct Laterality, Correct Procedure, Correct Position, site marked, Risks and benefits discussed, pre-op evaluation,  At surgeon's request and post-op pain management  Laterality: Left  Prep: Maximum Sterile Barrier Precautions used, chloraprep       Needles:  Injection technique: Single-shot  Needle Type: Echogenic Stimulator Needle     Needle Length: 9cm 9 cm Needle Gauge: 21 and 21 G    Additional Needles:  Procedures: ultrasound guided (picture in chart) Adductor canal block Narrative:  Start time: 06/15/2016 8:47 AM End time: 06/15/2016 8:57 AM Injection made incrementally with aspirations every 5 mL. Anesthesiologist: Roderic Palau  Additional Notes: 2% Lidocaine skin wheel.

## 2016-06-15 NOTE — Anesthesia Postprocedure Evaluation (Signed)
Anesthesia Post Note  Patient: Jackson Sherman  Procedure(s) Performed: Procedure(s) (LRB): LEFT TOTAL KNEE ARTHROPLASTY (Left)  Patient location during evaluation: PACU Anesthesia Type: Spinal, MAC and Regional Level of consciousness: awake and alert Pain management: pain level controlled Vital Signs Assessment: post-procedure vital signs reviewed and stable Respiratory status: spontaneous breathing and respiratory function stable Cardiovascular status: blood pressure returned to baseline and stable Postop Assessment: spinal receding Anesthetic complications: no       Last Vitals:  Vitals:   06/15/16 1258 06/15/16 1300  BP: 119/77   Pulse: (!) 59 (!) 57  Resp: 14 14  Temp:  36.5 C    Last Pain:  Vitals:   06/15/16 0725  TempSrc: Oral                 Marlisa Caridi,W. EDMOND

## 2016-06-15 NOTE — Anesthesia Procedure Notes (Signed)
Spinal  Patient location during procedure: OR Start time: 06/15/2016 9:50 AM End time: 06/15/2016 9:55 AM Staffing Anesthesiologist: Roderic Palau Performed: anesthesiologist  Preanesthetic Checklist Completed: patient identified, surgical consent, pre-op evaluation, timeout performed, IV checked, risks and benefits discussed and monitors and equipment checked Spinal Block Patient position: sitting Prep: DuraPrep Patient monitoring: cardiac monitor, continuous pulse ox and blood pressure Approach: midline Location: L3-4 Injection technique: single-shot Needle Needle type: Pencan  Needle gauge: 24 G Needle length: 9 cm Assessment Sensory level: T8 Additional Notes Functioning IV was confirmed and monitors were applied. Sterile prep and drape, including hand hygiene and sterile gloves were used. The patient was positioned and the spine was prepped. The skin was anesthetized with lidocaine.  Free flow of clear CSF was obtained prior to injecting local anesthetic into the CSF.  The spinal needle aspirated freely following injection.  The needle was carefully withdrawn.  The patient tolerated the procedure well.

## 2016-06-15 NOTE — H&P (Signed)
Jackson Sherman MRN:  [id number] DOB/SEX:  05-11-1961/male  CHIEF COMPLAINT:  Painful left Knee  HISTORY: Patient is a 55 y.o. male presented with a history of pain in the left knee. Onset of symptoms was gradual starting a few years ago with gradually worsening course since that time. Patient has been treated conservatively with over-the-counter NSAIDs and activity modification. Patient currently rates pain in the knee at 10 out of 10 with activity. There is pain at night.  PAST MEDICAL HISTORY: Patient Active Problem List   Diagnosis Date Noted  . Erectile dysfunction 03/16/2016  . PTSD (post-traumatic stress disorder) 03/16/2016  . Altered mental status 02/07/2016  . Seizure ([place])   . Alcohol abuse   . Elevated uric acid in blood 09/25/2015  . Cellulitis 09/24/2015  . Depression 08/22/2012  . Insomnia 05/06/2010  . Hyperlipidemia 04/06/2006  . OBESITY, NOS 04/06/2006  . ALCOHOL DEPENDENCE 04/06/2006  . HYPERTENSION, BENIGN SYSTEMIC 04/06/2006   Past Medical History:  Diagnosis Date  . 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 Depression   . GERD (gastroesophageal reflux disease)   . Hemorrhoids   . Hyperlipidemia   . Hypertension   . Seizures ([place])    ?? in August ...more related to ETOH rehab   Past Surgical History:  Procedure Laterality Date  . ARTHROSCOPIC REPAIR ACL     left  . FRACTURE SURGERY     no surgery on broke thumb  . HEMORRHOID SURGERY N/A 11/23/2012   Procedure: HEMORRHOIDECTOMY;  Surgeon: Madilyn Hook, DO;  Location: [facility];  Service: General;  Laterality: N/A;  . TONSILLECTOMY     Age 10      MEDICATIONS:   No prescriptions prior to admission.    ALLERGIES:   Allergies  Allergen Reactions  . No Known Allergies     REVIEW OF SYSTEMS:  A comprehensive review of systems was negative except for: Musculoskeletal: positive for arthralgias, bone pain and stiff joints   FAMILY HISTORY:   Family History  Problem Relation Age of Onset  .  Heart disease Maternal Grandmother 22  . Stroke Maternal Grandmother   . Diabetes Maternal Grandmother   . Hyperlipidemia Mother   . Hypertension Mother   . Breast cancer Mother 64  . Depression Sister     SOCIAL HISTORY:   Social History  Substance Use Topics  . Smoking status: Former Smoker    Packs/day: 1.00    Years: 15.00    Types: Cigarettes    Quit date: 06/30/2003  . Smokeless tobacco: Never Used  . Alcohol use No     Comment: Quit 03-2005, attending [place] meetings.  last treatement was in 01/2016     EXAMINATION:  Vital signs in last 24 hours:    There were no vitals taken for this visit.  General Appearance:    Alert, cooperative, no distress, appears stated age  Head:    Normocephalic, without obvious abnormality, atraumatic  Eyes:    PERRL, conjunctiva/corneas clear, EOM's intact, fundi    benign, both eyes  Ears:    Normal TM's and external ear canals, both ears  Nose:   Nares normal, septum midline, mucosa normal, no drainage    or sinus tenderness  Throat:   Lips, mucosa, and tongue normal; teeth and gums normal  Neck:   Supple, symmetrical, trachea midline, no adenopathy;    thyroid:  no enlargement/tenderness/nodules; no carotid   bruit or JVD  Back:     Symmetric, no curvature, ROM normal, no CVA  tenderness  Lungs:     Clear to auscultation bilaterally, respirations unlabored  Chest Wall:    No tenderness or deformity   Heart:    Regular rate and rhythm, S1 and S2 normal, no murmur, rub   or gallop     Abdomen:     Soft, non-tender, bowel sounds active all four quadrants,    no masses, no organomegaly     Rectal:    Normal tone, normal prostate, no masses or tenderness;   guaiac negative stool  Extremities:   Extremities normal, atraumatic, no cyanosis or edema  Pulses:   2+ and symmetric all extremities  Skin:   Skin color, texture, turgor normal, no rashes or lesions  Lymph nodes:   Cervical, supraclavicular, and axillary nodes normal  Neurologic:    CNII-XII intact, normal strength, sensation and reflexes    throughout     Musculoskeletal:  ROM 0-120, Ligaments intact,  Imaging Review Plain radiographs demonstrate severe degenerative joint disease of the left knee. The overall alignment is neutral. The bone quality appears to be good for age and reported activity level.  Assessment/Plan: Primary osteoarthritis, left knee   The patient history, physical examination and imaging studies are consistent with advanced degenerative joint disease of the left knee. The patient has failed conservative treatment.  The clearance notes were reviewed.  After discussion with the patient it was felt that Total Knee Replacement was indicated. The procedure,  risks, and benefits of total knee arthroplasty were presented and reviewed. The risks including but not limited to aseptic loosening, infection, blood clots, vascular injury, stiffness, patella tracking problems complications among others were discussed. The patient acknowledged the explanation, agreed to proceed with the plan. Donia Ast 06/15/2016, 6:28 AM

## 2016-06-15 NOTE — Transfer of Care (Signed)
Immediate Anesthesia Transfer of Care Note  Patient: Jackson Sherman  Procedure(s) Performed: Procedure(s): LEFT TOTAL KNEE ARTHROPLASTY (Left)  Patient Location: PACU  Anesthesia Type:Regional and Spinal  Level of Consciousness: awake, alert  and oriented  Airway & Oxygen Therapy: Patient Spontanous Breathing and Patient connected to nasal cannula oxygen  Post-op Assessment: Report given to RN and Post -op Vital signs reviewed and stable  Post vital signs: Reviewed and stable  Last Vitals:  Vitals:   06/15/16 0917 06/15/16 1227  BP: 110/70 114/83  Pulse: 66 64  Resp: 12 16  Temp:  (P) 36.4 C    Last Pain:  Vitals:   06/15/16 0725  TempSrc: Oral         Complications: No apparent anesthesia complications

## 2016-06-15 NOTE — Progress Notes (Signed)
Orthopedic Tech Progress Note Patient Details:  Jackson Sherman [birth year] [id number]  CPM Left Knee CPM Left Knee: On Left Knee Flexion (Degrees): 90 Left Knee Extension (Degrees): 0 Additional Comments: Applied CPM at 0-90, provided bone foam zero degree, Applied Overhead frame with trapeze bar. Patient tolerated well. Nurse was at bedside.    Kristopher Oppenheim 06/15/2016, 12:53 PM

## 2016-06-15 NOTE — Anesthesia Postprocedure Evaluation (Signed)
Anesthesia Post Note  Patient: Jackson Sherman  Procedure(s) Performed: Procedure(s) (LRB): LEFT TOTAL KNEE ARTHROPLASTY (Left)  Anesthesia Post Evaluation     Last Vitals:  Vitals:   06/15/16 1258 06/15/16 1300  BP: 119/77   Pulse: (!) 59 (!) 57  Resp: 14 14  Temp:  36.5 C    Last Pain:  Vitals:   06/15/16 0725  TempSrc: Oral                 Rayni Nemitz,W. EDMOND

## 2016-06-15 NOTE — Evaluation (Signed)
Physical Therapy Evaluation Patient Details Name: Jackson Sherman MRN: [id number] DOB: 1960-09-22 Today's Date: 06/15/2016   History of Present Illness  55 yo male admitted on 06/15/16 for Left TKA. PMH significant for PTSD, AMS, ETOH abuse, siezures.   Clinical Impression  Pt is POD 0 and moving well with therapy but limited by some incontinence of urine. Pt's nerve block has not complete worn off prior to this session and he begins to urinate without his knowledge. Pt is assisted to recliner so his linens can be changed. Prior to admission, pt worked full time for [employer] and plans to return to work after his recovery. Pt is able to get EOB with min a and perform short distance gait this session due to the above. Pt will benefit from continuing to be seen acutely in order to address the below deficits.     Follow Up Recommendations Home health PT    Equipment Recommendations  None recommended by PT    Recommendations for Other Services       Precautions / Restrictions Precautions Precautions: Knee Precaution Booklet Issued: Yes (comment) Precaution Comments: reviewed no pillow under knee Restrictions Weight Bearing Restrictions: Yes LLE Weight Bearing: Weight bearing as tolerated      Mobility  Bed Mobility Overal bed mobility: Needs Assistance Bed Mobility: Supine to Sit     Supine to sit: Min assist     General bed mobility comments: Min A to bring LLE EOB  Transfers Overall transfer level: Needs assistance Equipment used: Rolling walker (2 wheeled) Transfers: Sit to/from Stand Sit to Stand: Min assist         General transfer comment: Min A for safety from EOB  Ambulation/Gait Ambulation/Gait assistance: Min assist Ambulation Distance (Feet): 10 Feet Assistive device: Rolling walker (2 wheeled) Gait Pattern/deviations: Step-to pattern;Decreased step length - right;Decreased stance time - left;Decreased weight shift to left;Antalgic Gait velocity:  decreased Gait velocity interpretation: Below normal speed for age/gender General Gait Details: Mild antalgic gait, Min a for safety and to maneuver IV pole  Stairs            Wheelchair Mobility    Modified Rankin (Stroke Patients Only)       Balance                                             Pertinent Vitals/Pain Pain Assessment: 0-10 Pain Score: 5  Pain Location: left knee Pain Descriptors / Indicators: Burning;Throbbing Pain Intervention(s): Monitored during session;Premedicated before session;Ice applied    Home Living Family/patient expects to be discharged to:: Private residence Living Arrangements: Spouse/significant other Available Help at Discharge: Family;Available 24 hours/day;Home health Type of Home: House Home Access: Stairs to enter Entrance Stairs-Rails: Chemical engineer of Steps: 2 Home Layout: One level Home Equipment: Bedside commode;Walker - 2 wheels;Walker - 4 wheels      Prior Function Level of Independence: Independent         Comments: 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 At least 3/5 ankle and 2/5 knee and hip per gross functional assessment  Cervical / Trunk Assessment Cervical / Trunk Assessment: Normal  Communication   Communication: No difficulties  Cognition Arousal/Alertness: Awake/alert Behavior During Therapy: WFL for tasks assessed/performed Overall Cognitive Status: Within Functional Limits for tasks assessed                      General Comments      Exercises     Assessment/Plan    PT Assessment Patient needs continued PT services  PT Problem  List Decreased strength;Decreased range of motion;Decreased activity tolerance;Decreased balance;Decreased mobility;Decreased knowledge of use of DME;Pain          PT Treatment Interventions DME instruction;Gait training;Stair training;Functional mobility training;Therapeutic activities;Therapeutic exercise;Balance training;Patient/family education    PT Goals (Current goals can be found in the Care Plan section)  Acute Rehab PT Goals Patient Stated Goal: to get back to work PT Goal Formulation: With patient Time For Goal Achievement: 06/22/16 Potential to Achieve Goals: Good    Frequency 7X/week   Barriers to discharge        Co-evaluation               End of Session Equipment Utilized During Treatment: Gait belt Activity Tolerance: Patient tolerated treatment well;Patient limited by pain Patient left: in chair;with call bell/phone within reach;with family/visitor present;with nursing/sitter in room;Other (comment) (Zero foam under heel) Nurse Communication: Mobility status;Patient requests pain meds         Time: [id number] PT Time Calculation (min) (ACUTE ONLY): 41 min   Charges:   PT Evaluation $PT Eval Moderate Complexity: 1 Procedure PT Treatments $Therapeutic Activity: 23-37 mins   PT G Codes:        Scheryl Marten PT, DPT  [phone]  06/15/2016, 5:11 PM

## 2016-06-15 NOTE — Anesthesia Preprocedure Evaluation (Addendum)
Anesthesia Evaluation  Patient identified by MRN, date of birth, ID band Patient awake    Reviewed: Allergy & Precautions, H&P , NPO status , Patient's Chart, lab work & pertinent test results  Airway Mallampati: III  TM Distance: >3 FB Neck ROM: Full    Dental no notable dental hx. (+) Teeth Intact, Dental Advisory Given   Pulmonary neg pulmonary ROS, former smoker,    Pulmonary exam normal breath sounds clear to auscultation       Cardiovascular hypertension, Pt. on medications  Rhythm:Regular Rate:Normal     Neuro/Psych Seizures -,  Depression    GI/Hepatic Neg liver ROS, GERD  ,  Endo/Other  negative endocrine ROS  Renal/GU negative Renal ROS  negative genitourinary   Musculoskeletal   Abdominal   Peds  Hematology negative hematology ROS (+)   Anesthesia Other Findings   Reproductive/Obstetrics negative OB ROS                            Anesthesia Physical Anesthesia Plan  ASA: II  Anesthesia Plan: Spinal   Post-op Pain Management:  Regional for Post-op pain   Induction: Intravenous  Airway Management Planned: Simple Face Mask  Additional Equipment:   Intra-op Plan:   Post-operative Plan:   Informed Consent: I have reviewed the patients History and Physical, chart, labs and discussed the procedure including the risks, benefits and alternatives for the proposed anesthesia with the patient or authorized representative who has indicated his/her understanding and acceptance.   Dental advisory given  Plan Discussed with: CRNA  Anesthesia Plan Comments:         Anesthesia Quick Evaluation

## 2016-06-16 ENCOUNTER — Encounter (HOSPITAL_COMMUNITY): Payer: Self-pay | Admitting: Orthopedic Surgery

## 2016-06-16 LAB — BASIC METABOLIC PANEL
Anion gap: 10 (ref 5–15)
BUN: 20 mg/dL (ref 6–20)
CALCIUM: 8.4 mg/dL — AB (ref 8.9–10.3)
CO2: 22 mmol/L (ref 22–32)
CREATININE: 1.03 mg/dL (ref 0.61–1.24)
Chloride: 105 mmol/L (ref 101–111)
GFR calc non Af Amer: >60 mL/min (ref >60)
Glucose, Bld: 113 mg/dL — ABNORMAL HIGH (ref 65–99)
Potassium: 4.6 mmol/L (ref 3.5–5.1)
SODIUM: 137 mmol/L (ref 135–145)

## 2016-06-16 LAB — CBC
HCT: 38.9 % — ABNORMAL LOW (ref 39.0–52.0)
Hemoglobin: 13 g/dL (ref 13.0–17.0)
MCH: 28.8 pg (ref 26.0–34.0)
MCHC: 33.4 g/dL (ref 30.0–36.0)
MCV: 86.1 fL (ref 78.0–100.0)
PLATELETS: 271 10*3/uL (ref 150–400)
RBC: 4.52 MIL/uL (ref 4.22–5.81)
RDW: 13.3 % (ref 11.5–15.5)
WBC: 17.5 10*3/uL — ABNORMAL HIGH (ref 4.0–10.5)

## 2016-06-16 MED ORDER — MUPIROCIN 2 % EX OINT
1.0000 "application " | TOPICAL_OINTMENT | Freq: Two times a day (BID) | CUTANEOUS | Status: DC
  Administered 2016-06-16: 1 via NASAL
  Filled 2016-06-16: qty 22

## 2016-06-16 MED ORDER — METHOCARBAMOL 500 MG PO TABS: 500.0000 mg | ORAL_TABLET | Freq: Four times a day (QID) | ORAL | 0 refills | Status: DC | PRN

## 2016-06-16 MED ORDER — OXYCODONE HCL 5 MG PO TABS: 5.0000 mg | ORAL_TABLET | ORAL | 0 refills | Status: DC | PRN

## 2016-06-16 MED ORDER — CHLORHEXIDINE GLUCONATE CLOTH 2 % EX PADS
6.0000 | MEDICATED_PAD | Freq: Every day | CUTANEOUS | Status: DC
  Administered 2016-06-16: 6 via TOPICAL

## 2016-06-16 MED ORDER — ASPIRIN 325 MG PO TBEC: 325.0000 mg | DELAYED_RELEASE_TABLET | Freq: Two times a day (BID) | ORAL | 0 refills | Status: DC

## 2016-06-16 NOTE — Plan of Care (Signed)
Problem: Health Behavior/Discharge Planning: Goal: Ability to manage health-related needs will improve Outcome: Progressing Medicated twice for pain with full relief  Problem: Tissue Perfusion: Goal: Risk factors for ineffective tissue perfusion will decrease Outcome: Progressing No s/s of dvt. SCDs and Ted hose on   Problem: Activity: Goal: Risk for activity intolerance will decrease Outcome: Progressing Ambulated to BR with one assistance , tolerated well  Problem: Bowel/Gastric: Goal: Will not experience complications related to bowel motility Outcome: Progressing Denies gastric and bowel issues, had a BM on 06/15/2016

## 2016-06-16 NOTE — Op Note (Signed)
TOTAL KNEE REPLACEMENT OPERATIVE NOTE:  06/15/2016  8:43 AM  PATIENT:  Jackson Sherman  55 y.o. male  PRE-OPERATIVE DIAGNOSIS:  primary osteoarthritis left knee  POST-OPERATIVE DIAGNOSIS:  primary osteoarthritis left knee  PROCEDURE:  Procedure(s): LEFT TOTAL KNEE ARTHROPLASTY  SURGEON:  Surgeon(s): Vickey Huger, MD  PHYSICIAN ASSISTANT: Carlyon Shadow, [facility]  ANESTHESIA:   spinal  DRAINS: Hemovac  SPECIMEN: None  COUNTS:  Correct  TOURNIQUET:   Total Tourniquet Time Documented: Thigh (Left) - 73 minutes Total: Thigh (Left) - 73 minutes   DICTATION:  Indication for procedure:    The patient is a 55 y.o. male who has failed conservative treatment for primary osteoarthritis left knee.  Informed consent was obtained prior to anesthesia. The risks versus benefits of the operation were explain and in a way the patient can, and did, understand.   On the implant demand matching protocol, this patient scored 10.  Therefore, this patient was not receive a polyethylene insert with vitamin E which is a high demand implant.  Description of procedure:     The patient was taken to the operating room and placed under anesthesia.  The patient was positioned in the usual fashion taking care that all body parts were adequately padded and/or protected.  I foley catheter was not placed.  A tourniquet was applied and the leg prepped and draped in the usual sterile fashion.  The extremity was exsanguinated with the esmarch and tourniquet inflated to 350 mmHg.  Pre-operative range of motion was normal.  The knee was in 5 degree of mild varus.  A midline incision approximately 6-7 inches long was made with a #10 blade.  A new blade was used to make a parapatellar arthrotomy going 2-3 cm into the quadriceps tendon, over the patella, and alongside the medial aspect of the patellar tendon.  A synovectomy was then performed with the #10 blade and forceps. I then elevated the deep MCL off the medial  tibial metaphysis subperiosteally around to the semimembranosus attachment.    I everted the patella and used calipers to measure patellar thickness.  I used the reamer to ream down to appropriate thickness to recreate the native thickness.  I then removed excess bone with the rongeur and sagittal saw.  I used the appropriately sized template and drilled the three lug holes.  I then put the trial in place and measured the thickness with the calipers to ensure recreation of the native thickness.  The trial was then removed and the patella subluxed and the knee brought into flexion.  A homan retractor was place to retract and protect the patella and lateral structures.  A Z-retractor was place medially to protect the medial structures.  The extra-medullary alignment system was used to make cut the tibial articular surface perpendicular to the anamotic axis of the tibia and in 3 degrees of posterior slope.  The cut surface and alignment jig was removed.  I then used the intramedullary alignment guide to make a 6 valgus cut on the distal femur.  I then marked out the epicondylar axis on the distal femur.  The posterior condylar axis measured 3 degrees.  I then used the anterior referencing sizer and measured the femur to be a size 11.  The 4-In-1 cutting block was screwed into place in external rotation matching the posterior condylar angle, making our cuts perpendicular to the epicondylar axis.  Anterior, posterior and chamfer cuts were made with the sagittal saw.  The cutting block and cut pieces  were removed.  A lamina spreader was placed in 90 degrees of flexion.  The ACL, PCL, menisci, and posterior condylar osteophytes were removed.  A 10 mm spacer blocked was found to offer good flexion and extension gap balance after mild in degree releasing.   The scoop retractor was then placed and the femoral finishing block was pinned in place.  The small sagittal saw was used as well as the lug drill to finish the  femur.  The block and cut surfaces were removed and the medullary canal hole filled with autograft bone from the cut pieces.  The tibia was delivered forward in deep flexion and external rotation.  A size E tray was selected and pinned into place centered on the medial 1/3 of the tibial tubercle.  The reamer and keel was used to prepare the tibia through the tray.    I then trialed with the size 11 femur, size E tibia, a 10 mm insert and the 35 patella.  I had excellent flexion/extension gap balance, excellent patella tracking.  Flexion was full and beyond 120 degrees; extension was zero.  These components were chosen and the staff opened them to me on the back table while the knee was lavaged copiously and the cement mixed.  The soft tissue was infiltrated with 60cc of exparel 1.3% through a 21 gauge needle.  I cemented in the components and removed all excess cement.  The polyethylene tibial component was snapped into place and the knee placed in extension while cement was hardening.  The capsule was infilltrated with 30cc of .25% Marcaine with epinephrine.  A hemovac was place in the joint exiting superolaterally.  A pain pump was place superomedially superficial to the arthrotomy.  Once the cement was hard, the tourniquet was let down.  Hemostasis was obtained.  The arthrotomy was closed with figure-8 #1 vicryl sutures.  The deep soft tissues were closed with #0 vicryls and the subcuticular layer closed with a running #2-0 vicryl.  The skin was reapproximated and closed with skin staples.  The wound was dressed with xeroform, 4 x4's, 2 ABD sponges, a single layer of webril and a TED stocking.   The patient was then awakened, extubated, and taken to the recovery room in stable condition.  BLOOD LOSS:  300cc DRAINS: 1 hemovac, 1 pain catheter COMPLICATIONS:  None.  PLAN OF CARE: Admit to inpatient   PATIENT DISPOSITION:  PACU - hemodynamically stable.   Delay start of Pharmacological VTE agent  (>24hrs) due to surgical blood loss or risk of bleeding:  not applicable  Please fax a copy of this op note to my office at [phone] (please only include page 1 and 2 of the Case Information op note)

## 2016-06-16 NOTE — Care Management Note (Signed)
Case Management Note  Patient Details  Name: Jackson Sherman MRN: [id number] Date of Birth: [birth year]  Subjective/Objective: 55 yr old gentleman s/p left total knee arthroplasty.                   Action/Plan: Case manager spoke with patient's wife concerning home health and DME needs. Patient was preoperatively setup with [facility], no changes. Patient has rolling walker and 3in1, CPM to be delivered to his home by [facility]  later today. He will have family support at discharge.    Expected Discharge Date:   06/16/16               Expected Discharge Plan:  [place]  In-House Referral:  NA  Discharge planning Services  CM Consult  Post Acute Care Choice:  Home Health, Durable Medical Equipment Choice offered to:  Patient, Spouse  DME Arranged:  CPM DME Agency:  [facility]  HH Arranged:  PT [place] Agency:  [facility] ([facility]  Status of Service:     If discussed at [employer], dates discussed:    Additional Comments:  Ninfa Meeker, RN 06/16/2016, 12:34 PM

## 2016-06-16 NOTE — Discharge Summary (Signed)
SPORTS MEDICINE & JOINT REPLACEMENT   Lara Mulch, MD   Carlyon Shadow, PA-C [place], [place], [place]  [id number]                             [phone]  PATIENT ID: Jackson Sherman        MRN:  [id number]          DOB/AGE: [birth year] / 55 y.o.    DISCHARGE SUMMARY  ADMISSION DATE:    06/15/2016 DISCHARGE DATE:   06/16/2016   ADMISSION DIAGNOSIS: primary osteoarthritis left knee    DISCHARGE DIAGNOSIS:  primary osteoarthritis left knee    ADDITIONAL DIAGNOSIS: Active Problems:   S/P total knee replacement  Past Medical History:  Diagnosis Date  . 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 Depression   . GERD (gastroesophageal reflux disease)   . Hemorrhoids   . Hyperlipidemia   . Hypertension   . Seizures ([place])    ?? in August ...more related to ETOH rehab    PROCEDURE: Procedure(s): LEFT TOTAL KNEE ARTHROPLASTY on 06/15/2016  CONSULTS:    HISTORY:  See H&P in chart  HOSPITAL COURSE:  Jackson Sherman is a 55 y.o. admitted on 06/15/2016 and found to have a diagnosis of primary osteoarthritis left knee.  After appropriate laboratory studies were obtained  they were taken to the operating room on 06/15/2016 and underwent Procedure(s): LEFT TOTAL KNEE ARTHROPLASTY.   They were given perioperative antibiotics:  Anti-infectives    Start     Dose/Rate Route Frequency Ordered Stop   06/15/16 1530  ceFAZolin (ANCEF) IVPB 1 g/50 mL premix     1 g 100 mL/hr over 30 Minutes Intravenous Every 6 hours 06/15/16 1326 06/15/16 2239   06/15/16 0600  ceFAZolin (ANCEF) IVPB 2g/100 mL premix     2 g 200 mL/hr over 30 Minutes Intravenous On call to O.R. 06/12/16 1416 06/15/16 0953    .  Patient given tranexamic acid IV or topical and exparel intra-operatively.  Tolerated the procedure well.    POD# 1: Vital signs were stable.  Patient denied Chest pain, shortness of breath, or calf pain.  Patient was started on Lovenox 30 mg subcutaneously twice daily at 8am.   Consults to PT, OT, and care management were made.  The patient was weight bearing as tolerated.  CPM was placed on the operative leg 0-90 degrees for 6-8 hours a day. When out of the CPM, patient was placed in the foam block to achieve full extension. Incentive spirometry was taught.  Dressing was changed.       POD #2, Continued  PT for ambulation and exercise program.  IV saline locked.  O2 discontinued.    The remainder of the hospital course was dedicated to ambulation and strengthening.   The patient was discharged on 1 Day Post-Op in  Good condition.  Blood products given:none  DIAGNOSTIC STUDIES: Recent vital signs: Patient Vitals for the past 24 hrs:  BP Temp Temp src Pulse Resp SpO2  06/16/16 0815 127/86 - - - - -  06/16/16 0557 120/67 97.9 F (36.6 C) Oral 66 16 97 %  06/16/16 0000 124/75 98 F (36.7 C) Oral 75 16 97 %  06/15/16 2025 122/74 98.5 F (36.9 C) Oral 81 16 96 %  06/15/16 1413 124/77 - - 68 - 95 %  06/15/16 1330 123/85 97.8 F (36.6 C) Oral 63 - 95 %  06/15/16 1300 -  97.7 F (36.5 C) - (!) 57 14 98 %  06/15/16 1258 119/77 - - (!) 59 14 98 %  06/15/16 1242 112/76 - - 61 18 96 %  06/15/16 1227 114/83 97.5 F (36.4 C) - 64 16 97 %       Recent laboratory studies:  Recent Labs  06/15/16 0748 06/16/16 0613  WBC 9.2 17.5*  HGB 13.7 13.0  HCT 40.9 38.9*  PLT 273 271    Recent Labs  06/15/16 0748 06/16/16 0613  NA 138 137  K 4.2 4.6  CL 105 105  CO2 23 22  BUN 25* 20  CREATININE 1.05 1.03  GLUCOSE 106* 113*  CALCIUM 8.4* 8.4*   Lab Results  Component Value Date   INR 1.00 02/07/2016   INR 0.9 09/23/2009     Recent Radiographic Studies :  No results found.  DISCHARGE INSTRUCTIONS: Discharge Instructions    CPM    Complete by:  As directed    Continuous passive motion machine (CPM):      Use the CPM from 0 to 90 for 4-6 hours per day.      You may increase by 10 per day.  You may break it up into 2 or 3 sessions per day.      Use CPM  for 2 weeks or until you are told to stop.   Call MD / Call 911    Complete by:  As directed    If you experience chest pain or shortness of breath, CALL 911 and be transported to the hospital emergency room.  If you develope a fever above 101 F, pus (white drainage) or increased drainage or redness at the wound, or calf pain, call your surgeon's office.   Constipation Prevention    Complete by:  As directed    Drink plenty of fluids.  Prune juice may be helpful.  You may use a stool softener, such as Colace (over the counter) 100 mg twice a day.  Use MiraLax (over the counter) for constipation as needed.   Diet - low sodium heart healthy    Complete by:  As directed    Discharge instructions    Complete by:  As directed    INSTRUCTIONS AFTER JOINT REPLACEMENT   Remove items at home which could result in a fall. This includes throw rugs or furniture in walking pathways ICE to the affected joint every three hours while awake for 30 minutes at a time, for at least the first 3-5 days, and then as needed for pain and swelling.  Continue to use ice for pain and swelling. You may notice swelling that will progress down to the foot and ankle.  This is normal after surgery.  Elevate your leg when you are not up walking on it.   Continue to use the breathing machine you got in the hospital (incentive spirometer) which will help keep your temperature down.  It is common for your temperature to cycle up and down following surgery, especially at night when you are not up moving around and exerting yourself.  The breathing machine keeps your lungs expanded and your temperature down.   DIET:  As you were doing prior to hospitalization, we recommend a well-balanced diet.  DRESSING / WOUND CARE / SHOWERING  Keep the surgical dressing until follow up.  The dressing is water proof, so you can shower without any extra covering.  IF THE DRESSING FALLS OFF or the wound gets wet inside, change the dressing  with  sterile gauze.  Please use good hand washing techniques before changing the dressing.  Do not use any lotions or creams on the incision until instructed by your surgeon.    ACTIVITY  Increase activity slowly as tolerated, but follow the weight bearing instructions below.   No driving for 6 weeks or until further direction given by your physician.  You cannot drive while taking narcotics.  No lifting or carrying greater than 10 lbs. until further directed by your surgeon. Avoid periods of inactivity such as sitting longer than an hour when not asleep. This helps prevent blood clots.  You may return to work once you are authorized by your doctor.     WEIGHT BEARING   Weight bearing as tolerated with assist device (walker, cane, etc) as directed, use it as long as suggested by your surgeon or therapist, typically at least 4-6 weeks.   EXERCISES  Results after joint replacement surgery are often greatly improved when you follow the exercise, range of motion and muscle strengthening exercises prescribed by your doctor. Safety measures are also important to protect the joint from further injury. Any time any of these exercises cause you to have increased pain or swelling, decrease what you are doing until you are comfortable again and then slowly increase them. If you have problems or questions, call your caregiver or physical therapist for advice.   Rehabilitation is important following a joint replacement. After just a few days of immobilization, the muscles of the leg can become weakened and shrink (atrophy).  These exercises are designed to build up the tone and strength of the thigh and leg muscles and to improve motion. Often times heat used for twenty to thirty minutes before working out will loosen up your tissues and help with improving the range of motion but do not use heat for the first two weeks following surgery (sometimes heat can increase post-operative swelling).   These exercises  can be done on a training (exercise) mat, on the floor, on a table or on a bed. Use whatever works the best and is most comfortable for you.    Use music or television while you are exercising so that the exercises are a pleasant break in your day. This will make your life better with the exercises acting as a break in your routine that you can look forward to.   Perform all exercises about fifteen times, three times per day or as directed.  You should exercise both the operative leg and the other leg as well.   Exercises include:   Quad Sets - Tighten up the muscle on the front of the thigh (Quad) and hold for 5-10 seconds.   Straight Leg Raises - With your knee straight (if you were given a brace, keep it on), lift the leg to 60 degrees, hold for 3 seconds, and slowly lower the leg.  Perform this exercise against resistance later as your leg gets stronger.  Leg Slides: Lying on your back, slowly slide your foot toward your buttocks, bending your knee up off the floor (only go as far as is comfortable). Then slowly slide your foot back down until your leg is flat on the floor again.  Angel Wings: Lying on your back spread your legs to the side as far apart as you can without causing discomfort.  Hamstring Strength:  Lying on your back, push your heel against the floor with your leg straight by tightening up the muscles of your buttocks.  Repeat, but this time bend your knee to a comfortable angle, and push your heel against the floor.  You may put a pillow under the heel to make it more comfortable if necessary.   A rehabilitation program following joint replacement surgery can speed recovery and prevent re-injury in the future due to weakened muscles. Contact your doctor or a physical therapist for more information on knee rehabilitation.    CONSTIPATION  Constipation is defined medically as fewer than three stools per week and severe constipation as less than one stool per week.  Even if you have a  regular bowel pattern at home, your normal regimen is likely to be disrupted due to multiple reasons following surgery.  Combination of anesthesia, postoperative narcotics, change in appetite and fluid intake all can affect your bowels.   YOU MUST use at least one of the following options; they are listed in order of increasing strength to get the job done.  They are all available over the counter, and you may need to use some, POSSIBLY even all of these options:    Drink plenty of fluids (prune juice may be helpful) and high fiber foods Colace 100 mg by mouth twice a day  Senokot for constipation as directed and as needed Dulcolax (bisacodyl), take with full glass of water  Miralax (polyethylene glycol) once or twice a day as needed.  If you have tried all these things and are unable to have a bowel movement in the first 3-4 days after surgery call either your surgeon or your primary doctor.    If you experience loose stools or diarrhea, hold the medications until you stool forms back up.  If your symptoms do not get better within 1 week or if they get worse, check with your doctor.  If you experience "the worst abdominal pain ever" or develop nausea or vomiting, please contact the office immediately for further recommendations for treatment.   ITCHING:  If you experience itching with your medications, try taking only a single pain pill, or even half a pain pill at a time.  You can also use Benadryl over the counter for itching or also to help with sleep.   TED HOSE STOCKINGS:  Use stockings on both legs until for at least 2 weeks or as directed by physician office. They may be removed at night for sleeping.  MEDICATIONS:  See your medication summary on the "After Visit Summary" that nursing will review with you.  You may have some home medications which will be placed on hold until you complete the course of blood thinner medication.  It is important for you to complete the blood thinner  medication as prescribed.  PRECAUTIONS:  If you experience chest pain or shortness of breath - call 911 immediately for transfer to the hospital emergency department.   If you develop a fever greater that 101 F, purulent drainage from wound, increased redness or drainage from wound, foul odor from the wound/dressing, or calf pain - CONTACT YOUR SURGEON.                                                   FOLLOW-UP APPOINTMENTS:  If you do not already have a post-op appointment, please call the office for an appointment to be seen by your surgeon.  Guidelines for how soon to be seen are  listed in your "After Visit Summary", but are typically between 1-4 weeks after surgery.  OTHER INSTRUCTIONS:   Knee Replacement:  Do not place pillow under knee, focus on keeping the knee straight while resting. CPM instructions: 0-90 degrees, 2 hours in the morning, 2 hours in the afternoon, and 2 hours in the evening. Place foam block, curve side up under heel at all times except when in CPM or when walking.  DO NOT modify, tear, cut, or change the foam block in any way.  MAKE SURE YOU:  Understand these instructions.  Get help right away if you are not doing well or get worse.    Thank you for letting us be a part of your medical care team.  It is a privilege we respect greatly.  We hope these instructions will help you stay on track for a fast and full recovery!   Increase activity slowly as tolerated    Complete by:  As directed       DISCHARGE MEDICATIONS:   Allergies as of 06/16/2016      Reactions   No Known Allergies       Medication List    STOP taking these medications   ibuprofen 200 MG tablet Commonly known as:  ADVIL,MOTRIN     TAKE these medications   aspirin 325 MG EC tablet Take 1 tablet (325 mg total) by mouth 2 (two) times daily.   atorvastatin 20 MG tablet Commonly known as:  LIPITOR TAKE ONE TABLET BY MOUTH ONCE DAILY AT  6  PM   escitalopram 20 MG tablet Commonly known  as:  LEXAPRO Take 1 tablet (20 mg total) by mouth daily.   gabapentin 300 MG capsule Commonly known as:  NEURONTIN Take 1 capsule (300 mg total) by mouth 3 (three) times daily. 1 cap PO QHS x 4 days, the increase to BID. What changed:  additional instructions   lisinopril 20 MG tablet Commonly known as:  PRINIVIL,ZESTRIL TAKE ONE TABLET BY MOUTH ONCE DAILY BEFORE BREAKFAST   loratadine 10 MG tablet Commonly known as:  CLARITIN Take 10 mg by mouth daily.   methocarbamol 500 MG tablet Commonly known as:  ROBAXIN Take 1-2 tablets (500-1,000 mg total) by mouth every 6 (six) hours as needed for muscle spasms.   NEXIUM PO Take 22.3 mg by mouth daily.   oxyCODONE 5 MG immediate release tablet Commonly known as:  Oxy IR/ROXICODONE Take 1-2 tablets (5-10 mg total) by mouth every 3 (three) hours as needed for breakthrough pain.   tadalafil 20 MG tablet Commonly known as:  CIALIS Take 0.5-1 tablets (10-20 mg total) by mouth every other day as needed for erectile dysfunction.   traZODone 100 MG tablet Commonly known as:  DESYREL Take 1 tablet (100 mg total) by mouth at bedtime.            Durable Medical Equipment        Start     Ordered   06/15/16 1326  DME Walker rolling  Once    Question:  Patient needs a walker to treat with the following condition  Answer:  S/P total knee replacement   06/15/16 1326   06/15/16 1326  DME 3 n 1  Once     06/15/16 1326   06/15/16 1326  DME Bedside commode  Once    Question:  Patient needs a bedside commode to treat with the following condition  Answer:  S/P total knee replacement   06/15/16 1326  FOLLOW UP VISIT:    DISPOSITION: HOME VS. SNF  CONDITION:  Good   Donia Ast 06/16/2016, 11:58 AM

## 2016-06-16 NOTE — Progress Notes (Signed)
Physical Therapy Treatment Patient Details Name: Jackson Sherman MRN: [id number] DOB: [birth year] Today's Date: 06/16/2016    History of Present Illness 55 yo male admitted on 06/15/16 for Left TKA. PMH significant for PTSD, AMS, ETOH abuse, siezures.     PT Comments    Pt is POD 1 and moving well with therapy. Performed gait training, stair negotiation training and initiated exercises to improve strength and ROM this session with pain increasing from 4-7/10. Pt is, however, able to tolerate therapy despite pain increase. Pt will benefit from 1 additional visit prior to discharge in order to review HEP and stair negotiation as needed.    Follow Up Recommendations  Home health PT     Equipment Recommendations  None recommended by PT    Recommendations for Other Services       Precautions / Restrictions Precautions Precautions: Knee Precaution Booklet Issued: Yes (comment) Precaution Comments: reviewed no pillow under knee Restrictions Weight Bearing Restrictions: Yes LLE Weight Bearing: Weight bearing as tolerated    Mobility  Bed Mobility Overal bed mobility: Needs Assistance Bed Mobility: Supine to Sit     Supine to sit: Supervision     General bed mobility comments: Supervision for safety, able to bring LLE EOB without assistance this session  Transfers Overall transfer level: Needs assistance Equipment used: Rolling walker (2 wheeled) Transfers: Sit to/from Stand Sit to Stand: Supervision         General transfer comment: Supervision for safety  Ambulation/Gait Ambulation/Gait assistance: Min guard Ambulation Distance (Feet): 150 Feet Assistive device: Rolling walker (2 wheeled) Gait Pattern/deviations: Step-through pattern;Decreased weight shift to left;Antalgic Gait velocity: decreased Gait velocity interpretation: Below normal speed for age/gender General Gait Details: Mild antalgic gait. Improved sequencing and tolerance for weightbearing through  LLE   Stairs Stairs: Yes   Stair Management: Two rails Number of Stairs: 2 (2x2) General stair comments: performed ascend and descend x 2 with proper seuqncing following min cues  Wheelchair Mobility    Modified Rankin (Stroke Patients Only)       Balance Overall balance assessment: Needs assistance Sitting-balance support: No upper extremity supported;Feet supported Sitting balance-Leahy Scale: Good Sitting balance - Comments: Sitting EOB no back support    Standing balance support: No upper extremity supported;During functional activity Standing balance-Leahy Scale: Good Standing balance comment: able to perform handwashing and minimal movement away from walker without LOB                    Cognition Arousal/Alertness: Awake/alert Behavior During Therapy: WFL for tasks assessed/performed Overall Cognitive Status: Within Functional Limits for tasks assessed                      Exercises Total Joint Exercises Ankle Circles/Pumps: AROM;Both;20 reps;Seated Quad Sets: AROM;Left;10 reps;Seated Heel Slides: AROM;Left;10 reps;Seated Goniometric ROM: 2-95    General Comments        Pertinent Vitals/Pain Pain Assessment: 0-10 Pain Score: 4  Pain Location: left knee at rest, 7/10 with activity Pain Descriptors / Indicators: Burning;Throbbing Pain Intervention(s): Monitored during session;RN gave pain meds during session;Ice applied    Home Living                      Prior Function            PT Goals (current goals can now be found in the care plan section) Acute Rehab PT Goals Patient Stated Goal: to get back to work Progress towards PT  goals: Progressing toward goals    Frequency    7X/week      PT Plan Current plan remains appropriate    Co-evaluation             End of Session Equipment Utilized During Treatment: Gait belt Activity Tolerance: Patient tolerated treatment well Patient left: in chair;with call  bell/phone within reach     Time: 0742-0808 PT Time Calculation (min) (ACUTE ONLY): 26 min  Charges:  $Gait Training: 8-22 mins $Therapeutic Exercise: 8-22 mins                    G Codes:      Scheryl Marten PT, DPT  [phone]  06/16/2016, 8:14 AM

## 2016-06-16 NOTE — Evaluation (Addendum)
Occupational Therapy Evaluation/Discharge Patient Details Name: Jackson Sherman MRN: [id number] DOB: 01/24/1961 Today's Date: 06/16/2016    History of Present Illness 55 yo male admitted on 06/15/16 for Left TKA. PMH significant for PTSD, AMS, ETOH abuse, siezures.    Clinical Impression   PTA, pt was independent with ADL and functional mobility. Pt currently requires supervision for all ADL and functional mobility. Pt lives with wife who is able to provide 24 hour assistance/supervision. All education complete with family and pt concerning safe ADL transfers, dressing techniques, and fall prevention. Pt/family demonstrate understanding of all topics. No further acute OT needs identified and no OT follow-up recommended. OT will sign off.     Follow Up Recommendations  No OT follow up;Supervision/Assistance - 24 hour    Equipment Recommendations  None recommended by OT    Recommendations for Other Services       Precautions / Restrictions Precautions Precautions: Knee Precaution Booklet Issued: No Precaution Comments: reviewed no pillow under knee Restrictions Weight Bearing Restrictions: Yes LLE Weight Bearing: Weight bearing as tolerated      Mobility Bed Mobility               General bed mobility comments: Received in recliner  Transfers Overall transfer level: Needs assistance Equipment used: Rolling walker (2 wheeled) Transfers: Sit to/from Stand Sit to Stand: Supervision         General transfer comment: Supervision for safety    Balance Overall balance assessment: Needs assistance Sitting-balance support: No upper extremity supported;Feet supported Sitting balance-Leahy Scale: Good     Standing balance support: No upper extremity supported;During functional activity Standing balance-Leahy Scale: Good                              ADL Overall ADL's : Needs assistance/impaired     Grooming: Supervision/safety;Standing   Upper  Body Bathing: Set up;Sitting   Lower Body Bathing: Supervison/ safety;Sit to/from stand   Upper Body Dressing : Set up;Sitting   Lower Body Dressing: Supervision/safety;Sit to/from stand   Toilet Transfer: Supervision/safety;Ambulation;RW   Toileting- Clothing Manipulation and Hygiene: Supervision/safety;Sit to/from stand   Tub/ Banker: Walk-in shower;Ambulation;3 in 1;Rolling walker;Supervision/safety   Functional mobility during ADLs: Supervision/safety;Rolling walker General ADL Comments: Pt educated on dressing techniques, safe shower transfers, and fall prevention.     Vision Vision Assessment?: No apparent visual deficits   Perception     Praxis      Pertinent Vitals/Pain Pain Assessment: Faces Pain Score: 5  Faces Pain Scale: Hurts little more Pain Location: left knee Pain Descriptors / Indicators: Operative site guarding;Sore Pain Intervention(s): Limited activity within patient's tolerance;Monitored during session;Repositioned     Hand Dominance Left   Extremity/Trunk Assessment Upper Extremity Assessment Upper Extremity Assessment: Overall WFL for tasks assessed   Lower Extremity Assessment Lower Extremity Assessment: Defer to PT evaluation   Cervical / Trunk Assessment Cervical / Trunk Assessment: Normal   Communication Communication Communication: No difficulties   Cognition Arousal/Alertness: Awake/alert Behavior During Therapy: WFL for tasks assessed/performed Overall Cognitive Status: Within Functional Limits for tasks assessed                     General Comments       Exercises Exercises: Total Joint     Shoulder Instructions      Home Living Family/patient expects to be discharged to:: Private residence Living Arrangements: Spouse/significant other Available Help at Discharge: Family;Available 24 hours/day;Home  health Type of Home: House Home Access: Stairs to enter [employer] of Steps: 2 Entrance  Stairs-Rails: Left;Right Home Layout: One level     Bathroom Shower/Tub: Occupational psychologist: [place]: Bedside commode;Walker - 2 wheels;Walker - 4 wheels          Prior Functioning/Environment Level of Independence: Independent        Comments: 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, broadcasting for [employer]        OT Problem List: Decreased strength;Decreased range of motion;Decreased activity tolerance;Impaired balance (sitting and/or standing);Decreased safety awareness;Decreased knowledge of use of DME or AE;Decreased knowledge of precautions;Pain   OT Treatment/Interventions:      OT Goals(Current goals can be found in the care plan section) Acute Rehab OT Goals Patient Stated Goal: to get back to work OT Goal Formulation: With patient Time For Goal Achievement: 06/23/16 Potential to Achieve Goals: Good  OT Frequency:     Barriers to D/C:            Co-evaluation              End of Session Equipment Utilized During Treatment: Gait belt;Rolling walker Nurse Communication: Mobility status  Activity Tolerance: Patient tolerated treatment well Patient left: in chair;with call bell/phone within reach;with family/visitor present   Time: [id number] OT Time Calculation (min): 19 min Charges:  OT General Charges $OT Visit: 1 Procedure OT Evaluation $OT Eval Moderate Complexity: 1 Procedure  Norman Herrlich, OTR/L [phone] 06/16/2016, 1:25 PM

## 2016-06-16 NOTE — Progress Notes (Signed)
Reviewed discharge instructions/medications with patient and patient's wife.  Answered their questions.  Patient is ready for discharge.

## 2016-06-16 NOTE — Progress Notes (Signed)
Physical Therapy Treatment Patient Details Name: Jackson Sherman MRN: [id number] DOB: [birth year] Today's Date: 06/16/2016    History of Present Illness 55 yo male admitted on 06/15/16 for Left TKA. PMH significant for PTSD, AMS, ETOH abuse, siezures.     PT Comments    Pt continues to move well with therapy. Pt is able to increase gait distance with improved cadence and ability to weight bear through LLE. Pt is able to perform SLR without any quad lag and is able to maintain SLR for 1-2 seconds without fatigue. Pt is making excellent progress toward goals.    Follow Up Recommendations  Home health PT     Equipment Recommendations  None recommended by PT    Recommendations for Other Services       Precautions / Restrictions Precautions Precautions: Knee Precaution Booklet Issued: Yes (comment) Precaution Comments: reviewed no pillow under knee Restrictions Weight Bearing Restrictions: Yes LLE Weight Bearing: Weight bearing as tolerated    Mobility  Bed Mobility Overal bed mobility: Needs Assistance Bed Mobility: Supine to Sit     Supine to sit: Supervision     General bed mobility comments: Received in recliner  Transfers Overall transfer level: Needs assistance Equipment used: Rolling walker (2 wheeled) Transfers: Sit to/from Stand Sit to Stand: Supervision         General transfer comment: Supervision for safety  Ambulation/Gait Ambulation/Gait assistance: Supervision Ambulation Distance (Feet): 300 Feet Assistive device: Rolling walker (2 wheeled) Gait Pattern/deviations: Step-through pattern;Decreased weight shift to left;Antalgic Gait velocity: decreased Gait velocity interpretation: Below normal speed for age/gender General Gait Details: Mild antalgic gait. Improved sequencing and tolerance for weightbearing through LLE   Stairs Stairs: Yes   Stair Management: Two rails Number of Stairs: 2 (2x2) General stair comments: performed ascend and  descend x 2 with proper seuqncing following min cues  Wheelchair Mobility    Modified Rankin (Stroke Patients Only)       Balance Overall balance assessment: Needs assistance Sitting-balance support: No upper extremity supported;Feet supported Sitting balance-Leahy Scale: Good Sitting balance - Comments: Sitting EOB no back support    Standing balance support: No upper extremity supported;During functional activity Standing balance-Leahy Scale: Good Standing balance comment: able to perform handwashing and minimal movement away from walker without LOB                    Cognition Arousal/Alertness: Awake/alert Behavior During Therapy: WFL for tasks assessed/performed Overall Cognitive Status: Within Functional Limits for tasks assessed                      Exercises Total Joint Exercises Ankle Circles/Pumps: AROM;Both;20 reps;Seated Quad Sets: AROM;Left;10 reps;Seated Short Arc Quad: AROM;Left;10 reps;Seated Heel Slides: AROM;Left;10 reps;Seated Hip ABduction/ADduction: AROM;Left;10 reps;Supine Straight Leg Raises: AROM;Left;10 reps;Supine Goniometric ROM: 2-95    General Comments        Pertinent Vitals/Pain Pain Assessment: 0-10 Pain Score: 5  Pain Location: left knee Pain Descriptors / Indicators: Operative site guarding;Sore Pain Intervention(s): Monitored during session;Premedicated before session    Home Living                      Prior Function            PT Goals (current goals can now be found in the care plan section) Acute Rehab PT Goals Patient Stated Goal: to get back to work Progress towards PT goals: Progressing toward goals    Frequency  7X/week      PT Plan Current plan remains appropriate    Co-evaluation             End of Session Equipment Utilized During Treatment: Gait belt Activity Tolerance: Patient tolerated treatment well Patient left: in chair;with call bell/phone within reach;with  family/visitor present     Time: [id number] PT Time Calculation (min) (ACUTE ONLY): 11 min  Charges:  $Gait Training: 8-22 mins $Therapeutic Exercise: 8-22 mins                    G Codes:      Scheryl Marten PT, DPT  [phone]  06/16/2016, 11:40 AM

## 2016-06-16 NOTE — Progress Notes (Signed)
SPORTS MEDICINE AND JOINT REPLACEMENT  Lara Mulch, MD    Carlyon Shadow, PA-C [address], [place], [place]  [id number]                             [phone]   PROGRESS NOTE  Subjective:  negative for Chest Pain  negative for Shortness of Breath  negative for Nausea/Vomiting   negative for Calf Pain  negative for Bowel Movement   Tolerating Diet: yes         Patient reports pain as 4 on 0-10 scale.    Objective: Vital signs in last 24 hours:   Patient Vitals for the past 24 hrs:  BP Temp Temp src Pulse Resp SpO2 Height Weight  06/16/16 0557 120/67 97.9 F (36.6 C) Oral 66 16 97 % - -  06/16/16 0000 124/75 98 F (36.7 C) Oral 75 16 97 % - -  06/15/16 2025 122/74 98.5 F (36.9 C) Oral 81 16 96 % - -  06/15/16 1413 124/77 - - 68 - 95 % - -  06/15/16 1330 123/85 97.8 F (36.6 C) Oral 63 - 95 % - -  06/15/16 1300 - 97.7 F (36.5 C) - (!) 57 14 98 % - -  06/15/16 1258 119/77 - - (!) 59 14 98 % - -  06/15/16 1242 112/76 - - 61 18 96 % - -  06/15/16 1227 114/83 97.5 F (36.4 C) - 64 16 97 % - -  06/15/16 0917 110/70 - - 66 12 97 % - -  06/15/16 0915 - - - 64 14 95 % - -  06/15/16 0913 105/66 - - 64 14 97 % - -  06/15/16 0910 - - - 60 13 97 % - -  06/15/16 0908 108/67 - - 61 11 96 % - -  06/15/16 0907 - - - 63 12 96 % - -  06/15/16 0905 103/65 - - 61 11 95 % - -  06/15/16 0900 105/65 - - (!) 58 11 97 % - -  06/15/16 0855 99/60 - - (!) 59 11 93 % - -  06/15/16 0850 - - - 67 17 97 % - -  06/15/16 0845 - - - 69 18 98 % - -  06/15/16 0840 - - - 64 20 96 % - -  06/15/16 0835 - - - 66 20 96 % - -  06/15/16 0757 - - - - - - 5\' 10"  (1.778 m) 119.7 kg (264 lb)  06/15/16 0725 140/90 97.8 F (36.6 C) Oral 68 20 97 % - 119.7 kg (264 lb)    @flow {1959:LAST@   Intake/Output from previous day:   12/18 0701 - 12/19 0700 In: 1600 [I.V.:1600] Out: 900 [Urine:800]   Intake/Output this shift:   No intake/output data recorded.   Intake/Output      12/18 0701 - 12/19  0700 12/19 0701 - 12/20 0700   I.V. (mL/kg) 1600 (13.4)    Total Intake(mL/kg) 1600 (13.4)    Urine (mL/kg/hr) 800 (0.3)    Blood 100 (0)    Total Output 900     Net +700          Urine Occurrence 2 x       LABORATORY DATA:  Recent Labs  06/15/16 0748  WBC 9.2  HGB 13.7  HCT 40.9  PLT 273    Recent Labs  06/15/16 0748  NA 138  K 4.2  CL 105  CO2 23  BUN 25*  CREATININE 1.05  GLUCOSE 106*  CALCIUM 8.4*   Lab Results  Component Value Date   INR 1.00 02/07/2016   INR 0.9 09/23/2009    Examination:  General appearance: alert, cooperative and no distress Extremities: extremities normal, atraumatic, no cyanosis or edema  Wound Exam: clean, dry, intact   Drainage:  None: wound tissue dry  Motor Exam: Quadriceps and Hamstrings Intact  Sensory Exam: Superficial Peroneal, Deep Peroneal and Tibial normal   Assessment:    1 Day Post-Op  Procedure(s) (LRB): LEFT TOTAL KNEE ARTHROPLASTY (Left)  ADDITIONAL DIAGNOSIS:  Active Problems:   S/P total knee replacement     Plan: Physical Therapy as ordered Weight Bearing as Tolerated (WBAT)  DVT Prophylaxis:  Aspirin  DISCHARGE PLAN: Home  DISCHARGE NEEDS: HHPT   Patient doing great, will D/C today         Donia Ast 06/16/2016, 7:06 AM

## 2016-06-17 ENCOUNTER — Other Ambulatory Visit: Payer: Self-pay | Admitting: *Deleted

## 2016-06-17 NOTE — Patient Outreach (Signed)
[facility]) Care Management  06/17/2016  Jackson Sherman 1960/10/14 [id number]   Subjective: Telephone call to patient's home number, spoke with patient, and HIPAA verified.  Discussed [facility] Care Management [employer] Transition of care follow up, patient voiced understanding, and is in agreement to complete follow up. Patient states he is doing well, has hospital follow up call scheduled with surgeon on 06/30/16, and will call to schedule appointment with primary MD.  Had been receiving home health physical therapy and will soon be transitioning to outpatient therapy.   States his blood pressure reading on 06/17/16 with physical therapy was 118/80.   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  He also utilizes local [place]  for his medications.  Patient states he does not have any transition of care, care coordination, disease management, disease monitoring, transportation, community resource, or pharmacy needs at this time.  States he is very appreciative of the follow up call and is in agreement to receive [place] Management information.    Objective: Per chart review: Patient hospitalized 06/15/16 - 06/16/16 for primary osteoarthritis left knee.  Status post LEFT TOTAL KNEE ARTHROPLASTY on 06/15/16.   Patient hospitalized  02/07/16 - 02/08/16 for Acute alcohol intoxication with seizures.     Patient also has a history of hypertension.   Assessment: Received Christella Scheuermann transition of care referral on 12/ 19/17.   Referral source: Wilhemina Cash at [place].   Transition of care follow up completed, no care management needs, and will proceed with case closure.     Plan: RNCM will send patient successful outreach letter, [facility] pamphlet, and magnet. RNCM will send case closure due to follow up completed / no care management needs request to  [facility] [place] Management.     Jackson Sherman, BSN, [place] Management [facility] Telephonic CM Phone: [phone] Fax: [phone]

## 2016-06-18 ENCOUNTER — Encounter: Payer: Self-pay | Admitting: *Deleted

## 2016-07-07 ENCOUNTER — Other Ambulatory Visit: Payer: Self-pay | Admitting: Family Medicine

## 2016-08-01 ENCOUNTER — Other Ambulatory Visit: Payer: Self-pay | Admitting: Family Medicine

## 2016-10-22 ENCOUNTER — Other Ambulatory Visit: Payer: Self-pay | Admitting: Family Medicine

## 2016-12-22 DIAGNOSIS — M25512 Pain in left shoulder: Secondary | ICD-10-CM

## 2016-12-22 DIAGNOSIS — G8929 Other chronic pain: Secondary | ICD-10-CM | POA: Insufficient documentation

## 2017-01-04 ENCOUNTER — Other Ambulatory Visit: Payer: Self-pay | Admitting: Family Medicine

## 2017-02-11 ENCOUNTER — Emergency Department (HOSPITAL_COMMUNITY): Payer: Managed Care, Other (non HMO)

## 2017-02-11 ENCOUNTER — Emergency Department (HOSPITAL_COMMUNITY)
Admission: EM | Admit: 2017-02-11 | Discharge: 2017-02-11 | Disposition: A | Discharge status: Home / Self Care | Payer: Managed Care, Other (non HMO) | Attending: Emergency Medicine | Admitting: Emergency Medicine

## 2017-02-11 ENCOUNTER — Encounter (HOSPITAL_COMMUNITY): Payer: Self-pay | Admitting: Emergency Medicine

## 2017-02-11 DIAGNOSIS — E785 Hyperlipidemia, unspecified: Secondary | ICD-10-CM | POA: Diagnosis not present

## 2017-02-11 DIAGNOSIS — I10 Essential (primary) hypertension: Secondary | ICD-10-CM | POA: Diagnosis not present

## 2017-02-11 DIAGNOSIS — R202 Paresthesia of skin: Secondary | ICD-10-CM | POA: Insufficient documentation

## 2017-02-11 DIAGNOSIS — R531 Weakness: Secondary | ICD-10-CM | POA: Diagnosis not present

## 2017-02-11 DIAGNOSIS — Z7982 Long term (current) use of aspirin: Secondary | ICD-10-CM | POA: Insufficient documentation

## 2017-02-11 DIAGNOSIS — Z87891 Personal history of nicotine dependence: Secondary | ICD-10-CM | POA: Insufficient documentation

## 2017-02-11 DIAGNOSIS — Z79899 Other long term (current) drug therapy: Secondary | ICD-10-CM | POA: Diagnosis not present

## 2017-02-11 DIAGNOSIS — F10929 Alcohol use, unspecified with intoxication, unspecified: Secondary | ICD-10-CM | POA: Insufficient documentation

## 2017-02-11 DIAGNOSIS — R4182 Altered mental status, unspecified: Secondary | ICD-10-CM | POA: Diagnosis present

## 2017-02-11 DIAGNOSIS — R200 Anesthesia of skin: Secondary | ICD-10-CM

## 2017-02-11 LAB — RAPID URINE DRUG SCREEN, HOSP PERFORMED
Amphetamines: NOT DETECTED
Barbiturates: NOT DETECTED
Benzodiazepines: NOT DETECTED
Cocaine: NOT DETECTED
Opiates: NOT DETECTED
Tetrahydrocannabinol: NOT DETECTED

## 2017-02-11 LAB — I-STAT CHEM 8, ED
BUN: 15 mg/dL (ref 6–20)
Calcium, Ion: 0.89 mmol/L — CL (ref 1.15–1.40)
Chloride: 99 mmol/L — ABNORMAL LOW (ref 101–111)
Creatinine, Ser: 1.3 mg/dL — ABNORMAL HIGH (ref 0.61–1.24)
Glucose, Bld: 138 mg/dL — ABNORMAL HIGH (ref 65–99)
HCT: 47 % (ref 39.0–52.0)
Hemoglobin: 16 g/dL (ref 13.0–17.0)
Potassium: 3.8 mmol/L (ref 3.5–5.1)
Sodium: 136 mmol/L (ref 135–145)
TCO2: 21 mmol/L (ref 0–100)

## 2017-02-11 LAB — COMPREHENSIVE METABOLIC PANEL
ALT: 27 U/L (ref 17–63)
AST: 26 U/L (ref 15–41)
Albumin: 3.9 g/dL (ref 3.5–5.0)
Alkaline Phosphatase: 75 U/L (ref 38–126)
Anion gap: 15 (ref 5–15)
BUN: 14 mg/dL (ref 6–20)
CO2: 22 mmol/L (ref 22–32)
Calcium: 8.5 mg/dL — ABNORMAL LOW (ref 8.9–10.3)
Chloride: 99 mmol/L — ABNORMAL LOW (ref 101–111)
Creatinine, Ser: 1.12 mg/dL (ref 0.61–1.24)
GFR calc Af Amer: >60 mL/min (ref >60)
GFR calc non Af Amer: >60 mL/min (ref >60)
Glucose, Bld: 136 mg/dL — ABNORMAL HIGH (ref 65–99)
Potassium: 3.8 mmol/L (ref 3.5–5.1)
Sodium: 136 mmol/L (ref 135–145)
Total Bilirubin: 0.5 mg/dL (ref 0.3–1.2)
Total Protein: 7.1 g/dL (ref 6.5–8.1)

## 2017-02-11 LAB — CBC
HCT: 44.8 % (ref 39.0–52.0)
Hemoglobin: 15.3 g/dL (ref 13.0–17.0)
MCH: 29.5 pg (ref 26.0–34.0)
MCHC: 34.2 g/dL (ref 30.0–36.0)
MCV: 86.5 fL (ref 78.0–100.0)
Platelets: 293 10*3/uL (ref 150–400)
RBC: 5.18 MIL/uL (ref 4.22–5.81)
RDW: 14.7 % (ref 11.5–15.5)
WBC: 12.2 10*3/uL — ABNORMAL HIGH (ref 4.0–10.5)

## 2017-02-11 LAB — PROTIME-INR
INR: 0.95
Prothrombin Time: 12.6 seconds (ref 11.4–15.2)

## 2017-02-11 LAB — URINALYSIS, ROUTINE W REFLEX MICROSCOPIC
Bilirubin Urine: NEGATIVE
Glucose, UA: NEGATIVE mg/dL
Hgb urine dipstick: NEGATIVE
Ketones, ur: NEGATIVE mg/dL
Leukocytes, UA: NEGATIVE
Nitrite: NEGATIVE
Protein, ur: NEGATIVE mg/dL
Specific Gravity, Urine: 1.01 (ref 1.005–1.030)
pH: 5 (ref 5.0–8.0)

## 2017-02-11 LAB — DIFFERENTIAL
Basophils Absolute: 0.1 10*3/uL (ref 0.0–0.1)
Basophils Relative: 1 %
Eosinophils Absolute: 0.2 10*3/uL (ref 0.0–0.7)
Eosinophils Relative: 2 %
Lymphocytes Relative: 46 %
Lymphs Abs: 5.6 10*3/uL — ABNORMAL HIGH (ref 0.7–4.0)
Monocytes Absolute: 0.7 10*3/uL (ref 0.1–1.0)
Monocytes Relative: 5 %
Neutro Abs: 5.7 10*3/uL (ref 1.7–7.7)
Neutrophils Relative %: 46 %

## 2017-02-11 LAB — I-STAT TROPONIN, ED: Troponin i, poc: 0 ng/mL (ref 0.00–0.08)

## 2017-02-11 LAB — ETHANOL: Alcohol, Ethyl (B): 387 mg/dL (ref <5)

## 2017-02-11 LAB — APTT: aPTT: 26 seconds (ref 24–36)

## 2017-02-11 MED ORDER — SODIUM CHLORIDE 0.9 % IV BOLUS (SEPSIS)
1000.0000 mL | Freq: Once | INTRAVENOUS | Status: AC
  Administered 2017-02-11: 1000 mL via INTRAVENOUS

## 2017-02-11 MED ORDER — IOPAMIDOL (ISOVUE-370) INJECTION 76%
INTRAVENOUS | Status: AC
  Filled 2017-02-11: qty 50

## 2017-02-11 MED ORDER — ACETAMINOPHEN 500 MG PO TABS
1000.0000 mg | ORAL_TABLET | Freq: Once | ORAL | Status: AC
  Administered 2017-02-11: 1000 mg via ORAL
  Filled 2017-02-11: qty 2

## 2017-02-11 NOTE — ED Notes (Signed)
Was able to speak with pts wife. Pt's wife informed nurse that husband is chronic ETOH user and has been in and out of rehab 5x. Pt does seem to be getting restless and agitated.

## 2017-02-11 NOTE — Progress Notes (Signed)
   Provided emotional support for family (wife).  Will follow, as needed.

## 2017-02-11 NOTE — ED Provider Notes (Signed)
Height 5\' 10"  (1.778 m), weight 114.4 kg (252 lb 3.3 oz).  Assuming care from Dr. Gustavus Messing.  In short, Jackson Sherman is a 56 y.o. male with a chief complaint of Code Stroke .  Refer to the original H&P for additional details.  The current plan of care is to sober and reassess. Code Stroke cancelled. Any deficits thought to be EtOH related rather than TIA.   06:15 PM Patient is sobering. His wife is here to drive him home. She has called family to help at home. Provided alcohol treatment resources at discharge.  At this time, I do not feel there is any life-threatening condition present. I have reviewed and discussed all results (EKG, imaging, lab, urine as appropriate), exam findings with patient. I have reviewed nursing notes and appropriate previous records.  I feel the patient is safe to be discharged home without further emergent workup. Discussed usual and customary return precautions. Patient and family (if present) verbalize understanding and are comfortable with this plan.  Patient will follow-up with their primary care provider. If they do not have a primary care provider, information for follow-up has been provided to them. All questions have been answered.  Nanda Quinton, MD   Margette Fast, MD 02/11/17 [phone]

## 2017-02-11 NOTE — ED Provider Notes (Signed)
[facility] DEPT Provider Note   CSN: [id number] Arrival date & time: 02/11/17  1433     History   Chief Complaint No chief complaint on file.   HPI Jackson Sherman is a 56 y.o. male.  The history is provided by the EMS personnel, the patient, the spouse and medical records. No language interpreter was used.  Neurologic Problem  This is a recurrent problem. The current episode started less than 1 hour ago. The problem occurs constantly. The problem has not changed since onset.Pertinent negatives include no chest pain, no abdominal pain, no headaches and no shortness of breath. Nothing aggravates the symptoms. Nothing relieves the symptoms. He has tried nothing for the symptoms. The treatment provided no relief.    Past Medical History:  Diagnosis Date  . 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 Depression   . GERD (gastroesophageal reflux disease)   . Hemorrhoids   . Hyperlipidemia   . Hypertension   . Seizures ([place])    ?? in August ...more related to ETOH rehab    Patient Active Problem List   Diagnosis Date Noted  . 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 Erectile dysfunction 03/16/2016  . PTSD (post-traumatic stress disorder) 03/16/2016  . Altered mental status 02/07/2016  . Seizure ([place])   . Alcohol abuse   . Elevated uric acid in blood 09/25/2015  . Cellulitis 09/24/2015  . Depression 08/22/2012  . Insomnia 05/06/2010  . Hyperlipidemia 04/06/2006  . OBESITY, NOS 04/06/2006  . ALCOHOL DEPENDENCE 04/06/2006  . HYPERTENSION, BENIGN SYSTEMIC 04/06/2006    Past Surgical History:  Procedure Laterality Date  . ARTHROSCOPIC REPAIR ACL     left  . FRACTURE SURGERY     no surgery on broke thumb  . HEMORRHOID SURGERY N/A 11/23/2012   Procedure: HEMORRHOIDECTOMY;  Surgeon: Madilyn Hook, DO;  Location: [facility];  Service: General;  Laterality: N/A;  . TONSILLECTOMY     Age 56   . TOTAL KNEE ARTHROPLASTY Left 06/15/2016   Procedure: LEFT TOTAL KNEE  ARTHROPLASTY;  Surgeon: Vickey Huger, MD;  Location: [facility];  Service: Orthopedics;  Laterality: Left;       Home Medications    Prior to Admission medications   Medication Sig Start Date End Date Taking? Authorizing Provider  aspirin EC 325 MG EC tablet Take 1 tablet (325 mg total) by mouth 2 (two) times daily. 06/16/16   Donia Ast, PA  atorvastatin (LIPITOR) 20 MG tablet TAKE ONE TABLET BY MOUTH ONCE DAILY AT  6  PM 04/22/16   [name], MD  escitalopram (LEXAPRO) 20 MG tablet Take 1 tablet (20 mg total) by mouth daily. APPOINTMENT NEEDED FOR FURTHER REFILLS 01/04/17   [name], MD  Esomeprazole Magnesium (NEXIUM PO) Take 22.3 mg by mouth daily.    [provider]  gabapentin (NEURONTIN) 300 MG capsule Take 1 capsule (300 mg total) by mouth 3 (three) times daily. 1 cap PO QHS x 4 days, the increase to BID. Patient taking differently: Take 300 mg by mouth 3 (three) times daily.  04/01/16   [name], MD  lisinopril (PRINIVIL,ZESTRIL) 20 MG tablet Take 1 tablet (20 mg total) by mouth daily. APPOINTMENT NEEDED FOR FURTHER REFILLS 01/04/17   [name], MD  loratadine (CLARITIN) 10 MG tablet Take 10 mg by mouth daily.    [provider]  methocarbamol (ROBAXIN) 500 MG tablet Take 1-2 tablets (500-1,000 mg total) by mouth every 6 (six) hours as needed for muscle  spasms. 06/16/16   Donia Ast, PA  oxyCODONE (OXY IR/ROXICODONE) 5 MG immediate release tablet Take 1-2 tablets (5-10 mg total) by mouth every 3 (three) hours as needed for breakthrough pain. 06/16/16   Donia Ast, PA  tadalafil (CIALIS) 20 MG tablet Take 0.5-1 tablets (10-20 mg total) by mouth every other day as needed for erectile dysfunction. Patient not taking: Reported on 06/17/2016 03/19/16   [name], MD  traZODone (DESYREL) 100 MG tablet Take 1 tablet (100 mg total) by mouth at bedtime. APPOINTMENT NEEDED FOR FURTHER REFILLS 01/04/17    [name], MD    Family History Family History  Problem Relation Age of Onset  . Heart disease Maternal Grandmother 45  . Stroke Maternal Grandmother   . Diabetes Maternal Grandmother   . Hyperlipidemia Mother   . Hypertension Mother   . Breast cancer Mother 58  . Depression Sister     Social History Social History  Substance Use Topics  . Smoking status: Former Smoker    Packs/day: 1.00    Years: 15.00    Types: Cigarettes    Quit date: 06/30/2003  . Smokeless tobacco: Never Used  . Alcohol use No     Comment: Quit 03-2005, attending [place] meetings.  last treatement was in 01/2016     Allergies   No known allergies   Review of Systems Review of Systems  Constitutional: Negative for chills, diaphoresis, fatigue and fever.  HENT: Negative for congestion.   Eyes: Negative for visual disturbance.  Respiratory: Negative for cough, chest tightness, shortness of breath and wheezing.   Cardiovascular: Negative for chest pain.  Gastrointestinal: Negative for abdominal pain, constipation, diarrhea, nausea and vomiting.  Genitourinary: Negative for flank pain.  Musculoskeletal: Negative for back pain, neck pain and neck stiffness.  Skin: Negative for rash and wound.  Neurological: Positive for speech difficulty, weakness and numbness. Negative for light-headedness and headaches.  Psychiatric/Behavioral: Negative for agitation.  All other systems reviewed and are negative.    Physical Exam Updated Vital Signs There were no vitals taken for this visit.  Physical Exam  Constitutional: He is oriented to person, place, and time. He appears well-developed and well-nourished. No distress.  HENT:  Head: Normocephalic and atraumatic.  Mouth/Throat: Oropharynx is clear and moist. No oropharyngeal exudate.  Eyes: Pupils are equal, round, and reactive to light. Conjunctivae and EOM are normal.  Neck: Normal range of motion. Neck supple.  Cardiovascular: Normal rate,  regular rhythm and intact distal pulses.   No murmur heard. Pulmonary/Chest: Effort normal and breath sounds normal. No stridor. No respiratory distress. He has no wheezes. He exhibits no tenderness.  Abdominal: Soft. There is no tenderness.  Musculoskeletal: He exhibits no edema or tenderness.  Neurological: He is alert and oriented to person, place, and time. He is not disoriented. He displays normal reflexes. A sensory deficit is present. No cranial nerve deficit. He exhibits abnormal muscle tone. Coordination normal. GCS eye subscore is 4. GCS verbal subscore is 5. GCS motor subscore is 6.  L face and L arm numbness. L grip strength decreased on arrival. Slurred speech.   Skin: Skin is warm and dry. Capillary refill takes less than 2 seconds. He is not diaphoretic. No erythema. No pallor.  Psychiatric: He has a normal mood and affect.  Nursing note and vitals reviewed.    ED Treatments / Results  Labs (all labs ordered are listed, but only abnormal results are displayed) Labs Reviewed  ETHANOL - Abnormal;  Notable for the following:       Result Value   Alcohol, Ethyl (B) 387 (*)    All other components within normal limits  CBC - Abnormal; Notable for the following:    WBC 12.2 (*)    All other components within normal limits  DIFFERENTIAL - Abnormal; Notable for the following:    Lymphs Abs 5.6 (*)    All other components within normal limits  COMPREHENSIVE METABOLIC PANEL - Abnormal; Notable for the following:    Chloride 99 (*)    Glucose, Bld 136 (*)    Calcium 8.5 (*)    All other components within normal limits  URINALYSIS, ROUTINE W REFLEX MICROSCOPIC - Abnormal; Notable for the following:    Color, Urine STRAW (*)    All other components within normal limits  I-STAT CHEM 8, ED - Abnormal; Notable for the following:    Chloride 99 (*)    Creatinine, Ser 1.30 (*)    Glucose, Bld 138 (*)    Calcium, Ion 0.89 (*)    All other components within normal limits    PROTIME-INR  APTT  RAPID URINE DRUG SCREEN, HOSP PERFORMED  I-STAT TROPONIN, ED    EKG  EKG Interpretation  Date/Time:  Thursday February 11 2017 15:10:02 EDT Ventricular Rate:  81 PR Interval:    QRS Duration: 112 QT Interval:  374 QTC Calculation: 435 R Axis:   -19 Text Interpretation:  Sinus rhythm Borderline intraventricular conduction delay No STEMI. Similar to prior.  Confirmed by Nanda Quinton [phone]) on 02/11/2017 3:18:01 PM       Radiology Ct Angio Head W Or Wo Contrast  Result Date: 02/11/2017 CLINICAL DATA:  Confusion.  Code stroke. EXAM: CT ANGIOGRAPHY HEAD AND NECK TECHNIQUE: Multidetector CT imaging of the head and neck was performed using the standard protocol during bolus administration of intravenous contrast. Multiplanar CT image reconstructions and MIPs were obtained to evaluate the vascular anatomy. Carotid stenosis measurements (when applicable) are obtained utilizing NASCET criteria, using the distal internal carotid diameter as the denominator. CONTRAST:  50 mL Isovue 370 COMPARISON:  Head CT 02/11/2017 Brain MRI 04/2016 FINDINGS: CTA NECK FINDINGS Aortic arch: There is no aneurysm or dissection of the visualized ascending aorta or aortic arch. Normal 3 vessel aortic branching pattern. The visualized proximal subclavian arteries are normal. Right carotid system: The right common carotid origin is widely patent. There is no common carotid or internal carotid artery dissection or aneurysm. No hemodynamically significant stenosis. Left carotid system: The left common carotid origin is widely patent. There is no common carotid or internal carotid artery dissection or aneurysm. No hemodynamically significant stenosis. Vertebral arteries: The vertebral system is left dominant. Both vertebral artery origins are normal. Both vertebral arteries are normal to their confluence with the basilar artery. Skeleton: There is no bony spinal canal stenosis. No lytic or blastic lesions.  Other neck: The nasopharynx is clear. The oropharynx and hypopharynx are normal. The epiglottis is normal. The supraglottic larynx, glottis and subglottic larynx are normal. No retropharyngeal collection. The parapharyngeal spaces are preserved. The parotid and submandibular glands are normal. No sialolithiasis or salivary ductal dilatation. The thyroid gland is normal. There is no cervical lymphadenopathy. Upper chest: No pneumothorax or pleural effusion. No nodules or masses. Review of the MIP images confirms the above findings CTA HEAD FINDINGS Anterior circulation: --Intracranial internal carotid arteries: Normal. --Anterior cerebral arteries: Normal. --Middle cerebral arteries: Normal. --Posterior communicating arteries: Absent bilaterally. Posterior circulation: --Posterior cerebral arteries: Normal. --Superior  cerebellar arteries: Normal. --Basilar artery: Normal. --Anterior inferior cerebellar arteries: Normal. --Posterior inferior cerebellar arteries: Normal. Venous sinuses: As permitted by contrast timing, patent. Anatomic variants: None Delayed phase: No parenchymal contrast enhancement. Review of the MIP images confirms the above findings IMPRESSION: Normal CTA of the head and neck. Electronically Signed   By: Ulyses Jarred M.D.   On: 02/11/2017 15:24   Ct Angio Neck W Or Wo Contrast  Result Date: 02/11/2017 CLINICAL DATA:  Confusion.  Code stroke. EXAM: CT ANGIOGRAPHY HEAD AND NECK TECHNIQUE: Multidetector CT imaging of the head and neck was performed using the standard protocol during bolus administration of intravenous contrast. Multiplanar CT image reconstructions and MIPs were obtained to evaluate the vascular anatomy. Carotid stenosis measurements (when applicable) are obtained utilizing NASCET criteria, using the distal internal carotid diameter as the denominator. CONTRAST:  50 mL Isovue 370 COMPARISON:  Head CT 02/11/2017 Brain MRI 04/2016 FINDINGS: CTA NECK FINDINGS Aortic arch: There is no  aneurysm or dissection of the visualized ascending aorta or aortic arch. Normal 3 vessel aortic branching pattern. The visualized proximal subclavian arteries are normal. Right carotid system: The right common carotid origin is widely patent. There is no common carotid or internal carotid artery dissection or aneurysm. No hemodynamically significant stenosis. Left carotid system: The left common carotid origin is widely patent. There is no common carotid or internal carotid artery dissection or aneurysm. No hemodynamically significant stenosis. Vertebral arteries: The vertebral system is left dominant. Both vertebral artery origins are normal. Both vertebral arteries are normal to their confluence with the basilar artery. Skeleton: There is no bony spinal canal stenosis. No lytic or blastic lesions. Other neck: The nasopharynx is clear. The oropharynx and hypopharynx are normal. The epiglottis is normal. The supraglottic larynx, glottis and subglottic larynx are normal. No retropharyngeal collection. The parapharyngeal spaces are preserved. The parotid and submandibular glands are normal. No sialolithiasis or salivary ductal dilatation. The thyroid gland is normal. There is no cervical lymphadenopathy. Upper chest: No pneumothorax or pleural effusion. No nodules or masses. Review of the MIP images confirms the above findings CTA HEAD FINDINGS Anterior circulation: --Intracranial internal carotid arteries: Normal. --Anterior cerebral arteries: Normal. --Middle cerebral arteries: Normal. --Posterior communicating arteries: Absent bilaterally. Posterior circulation: --Posterior cerebral arteries: Normal. --Superior cerebellar arteries: Normal. --Basilar artery: Normal. --Anterior inferior cerebellar arteries: Normal. --Posterior inferior cerebellar arteries: Normal. Venous sinuses: As permitted by contrast timing, patent. Anatomic variants: None Delayed phase: No parenchymal contrast enhancement. Review of the MIP  images confirms the above findings IMPRESSION: Normal CTA of the head and neck. Electronically Signed   By: Ulyses Jarred M.D.   On: 02/11/2017 15:24   Ct Head Code Stroke Wo Contrast  Result Date: 02/11/2017 CLINICAL DATA:  Code stroke. Code stroke. Left-sided weakness and confusion. EXAM: CT HEAD WITHOUT CONTRAST TECHNIQUE: Contiguous axial images were obtained from the base of the skull through the vertex without intravenous contrast. COMPARISON:  MRI and CT 02/07/2016 FINDINGS: Brain: Mild to moderate generalized brain atrophy. No evidence of old or acute focal infarction, mass lesion, hemorrhage, hydrocephalus or extra-axial collection. Vascular: No abnormal vascular finding. Skull: Negative Sinuses/Orbits: Clear/normal Other: None ASPECTS ([place] Stroke Program Early CT Score) - Ganglionic level infarction (caudate, lentiform nuclei, internal capsule, insula, M1-M3 cortex): 7 - Supraganglionic infarction (M4-M6 cortex): 3 Total score (0-10 with 10 being normal): 10 IMPRESSION: 1. Mild to moderate generalized atrophy. No focal insult. No acute finding. 2. ASPECTS is 10. These results were called by telephone at the  time of interpretation on 02/11/2017 at 2:50 pm to Dr. Sherry Ruffing , who verbally acknowledged these results. Electronically Signed   By: Nelson Chimes M.D.   On: 02/11/2017 14:52    Procedures Procedures (including critical care time)  Medications Ordered in ED Medications  iopamidol (ISOVUE-370) 76 % injection (not administered)  sodium chloride 0.9 % bolus 1,000 mL (0 mLs Intravenous Stopped 02/11/17 1929)  sodium chloride 0.9 % bolus 1,000 mL (0 mLs Intravenous Stopped 02/11/17 1929)  acetaminophen (TYLENOL) tablet 1,000 mg (1,000 mg Oral Given 02/11/17 1731)     Initial Impression / Assessment and Plan / ED Course  I have reviewed the triage vital signs and the nursing notes.  Pertinent labs & imaging results that were available during my care of the patient were reviewed by me and  considered in my medical decision making (see chart for details).     Jackson Sherman is a 56 y.o. male with a past medical history significant for hyperlipidemia, alcohol abuse, and history of possible seizures who presents as a code stroke. Patient works at [employer] center and proximally 12 minutes prior to arrival, was noticed to have slurred speech, left face and left arm numbness, and left arm weakness. Patient was made a code stroke in route and was evaluated on arrival. Patient's airway was clear and he was taken to the CT scanner for evaluation.  On my initial evaluation, patient had slightly decreased sensation on the left face and left arm. Grip strength was slightly decreased on the left however, this improved during his stay in the emergency department. Patient had normal extraocular movements. Lungs are clear and abdomen was nontender. Speech was slightly slurred. Patient was alert and oriented.  CT imaging was reassuring. Further investigation into patient's history reveals that he has had similar symptoms in the setting of acute intoxication with alcohol. Patient reports that he did not drink today but instead drank alcohol last night.   Patient's CT imaging was reassuring and neurology felt that his symptoms are likely secondary to acute alcohol intoxication when his alcohol returned at 387.  As of now, neurology has signed off and do not feel patient has had a stroke today. They do not feel further imaging was needed.  Patient's other laboratory testing showed no evidence of acute infection. Slightly elevated at 1.3. Patient will be given several liters of fluid and reassessed after metabolism of alcohol to determine if any further neurologic deficits are present. Mild leukocytosis.  Arbie Cookey be transferred to Dr. Laverta Baltimore while awaiting metabolism of alcohol. The patient's symptoms have improved and he has no further complaints, suspect patient will be safe for discharge in  several hours. Patient will need to follow-up with his PCP for further management. When this plan was discussed and they had no other questions.   Final Clinical Impressions(s) / ED Diagnoses   Final diagnoses:  Numbness  Alcoholic intoxication with complication (HCC)    Clinical Impression: 1. Numbness   2. Alcoholic intoxication with complication [facility])     Disposition: Care transferred to Dr. Laverta Baltimore while awaiting reassessment and metabolism.     Lorenza Winkleman, Gwenyth Allegra, MD 02/11/17 2126

## 2017-02-11 NOTE — Discharge Instructions (Signed)
Substance Abuse Treatment Programs ° °Intensive Outpatient Programs °[facility]     °[address]      °[place], [place]                   °[phone]      ° °[facility] °[address] °[place], [place] °336-379-7146 ° °[facility]     °(Inpatient and outpatient)     °[address]           °[phone]   ° °[facility] °336-288-1484 (Suboxone and Methadone) ° °[address]      °[place], [place] [address]      °336-882-2125      ° °[address] °[place], [place] °852-3033 ° °[facility] (Outpatient/Inpatient, Chemical)    °(insurance only) [phone]      °       °[facility] (Groups & Residential) °[place], [place] °336-389-1413 ° °   °[facility]     °[address]     °[place], [place]      °336-389-1413      ° °[facility] (for caregivers and family) °[address] °[place], [place] °336-299-4655 ° ° ° ° ° °Residential Treatment Programs °[facility]      °[address], [place], [place] [address]  °([phone]      ° °[facility] °[address] [place], [place] [address] °[phone] ° °[facility]        °336-248-8914      ° °[facility] °1-800-659-3381 ° °[facility]             °[address]                                         °[place], [place]                                                °877-615-2722 or 336-784-9470                              ° °[facility] [place] °[address] °[place], [address] °1.877.941.8954 ° °[facility]    °[address]      °[place], [place]     °[phone]      ° °[facility] °[address] °[place], [place] °336-285-9073 ° °[facility]   °[address]     °[place], [place] [address]     °336-899-1550      °Admissions: 8am-3pm M-F ° °[facility] °[address] °[place] °336-227-7417 ° °[facility]: Residential Program (90 Days)   °[place], [place]      °336-725-8389 or 800-758-6077    ° °[facility] °[place],  °(Walk in Hours over the weekend or by referral) ° °[facility] °[address], [place],  [address] °(336) [phone] ° °Crisis Mobile: [facility]:  1-877-626-1772 (for crisis response 24 hours a day) °[facility] Hotline:      1-800-256-2452 °Outpatient Psychiatry and Counseling ° °[facility]: Mobile Crisis   Management 24 hours:  1-877-626-1772 ° °[facility] sliding scale fee and walk in schedule: M-F 8am-12pm/1pm-3pm °[address]  °[place], [place] [address] °[phone] ° °[facility] °[address] °[place], [place] [address] °336-703-9650 ° °[facility] ([facility] known [facility])- new patient walk-in appointments available Monday - Friday 8am -3pm.          °[address] °[place], [place] [address] °336-676-6840 or crisis line- 336-676-6905 ° °[facility] Intensive Outpatient Therapy Program °[address] °[place], [place] [address] °336-832-9804 ° °[facility]                  °Crisis Services      °336.641.4993      °[address]     °[place], [place] [address]                ° °[facility]   °[facility] °[phone] °[address] °[place], [place] [address] ° ° °[facility]          °[address],  °[place], [place] [address]       °(336) [phone] ° °[facility] °[address] °[place], [place] [address] °336-292-1510 ° °[facility]    °[address]    °[place], [place] [address]     °336-632-3505      ° °[place]     °[address]     °[place] [address]     °336-282-1251     °  °[facility] °[address] °[place] [address] ° °[facility]     °[address]     °[place], [place]      °336-542-2076      ° °[facility] °[place], [place] Sherman °[address] °[place], [place] [address] °336-420-9558 ° °[facility]     °[address]     °[place], [place] [address]     °336-274-1237      ° °[facility] °[address] °[place], [place] [address] °336-854-4450 °Resources for Temporary Residential Assistance/Crisis Centers ° °DAY CENTERS °[facility] °M-F 8am-3pm   °[address] [place], [place] [address]   336-332-0824 °Services include: laundry, barbering, support groups, case management, phone  & computer access, showers, AA/NA mtgs, mental health/substance abuse nurse, job skills class, disability information, VA assistance, spiritual classes, etc.  ° °HOMELESS SHELTERS ° °[facility]     °[facility]   °[address], [place]     °336.271.5959       °       °[facility] (women and children)       °[address] °[place], [place] [address] °336-275-0820 °[email or web address] for application and process °Application Required ° °[facility]   °[address]    °[place] [address]     °[phone]       °             °[facility] °[address] °[place], [place] [address] °336.273.5572 °336-235-0363(schedule application appt.) °Application Required ° °[facility] (women only)    °[address]     °[place], [place] [address]     °336-884-1039      °  Intake starts 6pm daily °Need valid ID, SSC, & Police report °[facility] [place] °[address] °[place], [place] °336-881-5420 °Application Required ° °[facility] (men only)     °[address]      °[place], [place]     °[phone]      ° °[facility] °(Pregnant women only) °[address] °[place], [place] °336-275-0206 ° °[facility]      °[address]      °[place], [place] [address]     °[phone]      °       °[facility] °[address] °[place], [place] °336-723-1848 °90 day commitment/SA/Application process ° °[facility](men only)     °[address]     °[place], [place]     °336-748-1962       °Check-in at 7pm     °       °[facility] °[address] °[place], [place] [address] °336-248-6684 °Men/Women/Women and Children must be there by 7 pm ° °[facility] °[place], [place] °336-722-8721                ° °

## 2017-02-11 NOTE — Consult Note (Signed)
Requesting Physician: Dr. Gustavus Messing    Chief Complaint: left sided weakness  History obtained from:  Chart  HPI:                                                                                                                                         Jackson Sherman is an 56 y.o. male brought to to the hospital via EMS as code stroke due to left sided weakness. 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 He was worried he had a seizure and called paramedics. Enroute EMS noticed that he was weaker on the left side, dysarthric.    On arrival he show no significant weakness in left side however was ataxic bilaterally, some dysarthria and at times minimal effort.  While examining patient in CT he stated that his right side had decreased sensation and is left side improved. He had no recollection of what occurred at work.   Patient was here one year ago for code stroke where at work he was confused. His ETOH level was 266 negative MRI.   Date last known well: Date: 02/11/2017 Time last known well: Time: 13:50 tPA Given: No: minimal symptom  Modified Rankin: Rankin Score=0    Past Medical History:  Diagnosis Date  . 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 Depression   . GERD (gastroesophageal reflux disease)   . Hemorrhoids   . Hyperlipidemia   . Hypertension   . Seizures ([place])    ?? in August ...more related to ETOH rehab    Past Surgical History:  Procedure Laterality Date  . ARTHROSCOPIC REPAIR ACL     left  . FRACTURE SURGERY     no surgery on broke thumb  . HEMORRHOID SURGERY N/A 11/23/2012   Procedure: HEMORRHOIDECTOMY;  Surgeon: Madilyn Hook, DO;  Location: [facility];  Service: General;  Laterality: N/A;  . TONSILLECTOMY     Age 1   . TOTAL KNEE ARTHROPLASTY Left 06/15/2016   Procedure: LEFT TOTAL KNEE ARTHROPLASTY;  Surgeon: Vickey Huger, MD;  Location: [facility];  Service: Orthopedics;  Laterality: Left;    Family History   Problem Relation Age of Onset  . Heart disease Maternal Grandmother 31  . Stroke Maternal Grandmother   . Diabetes Maternal Grandmother   . Hyperlipidemia Mother   . Hypertension Mother   . Breast cancer Mother 82  . Depression Sister    Social History:  reports that he quit smoking about 13 years ago. His smoking use included Cigarettes. He has a 15.00 pack-year smoking history. He has never used smokeless tobacco. He reports that he does not drink alcohol or use drugs.  Allergies:  Allergies  Allergen Reactions  . No Known Allergies     Medications:  Current Facility-Administered Medications  Medication Dose Route Frequency Provider Last Rate Last Dose  . iopamidol (ISOVUE-370) 76 % injection            Current Outpatient Prescriptions  Medication Sig Dispense Refill  . aspirin EC 325 MG EC tablet Take 1 tablet (325 mg total) by mouth 2 (two) times daily. 30 tablet 0  . atorvastatin (LIPITOR) 20 MG tablet TAKE ONE TABLET BY MOUTH ONCE DAILY AT  6  PM 90 tablet 1  . escitalopram (LEXAPRO) 20 MG tablet Take 1 tablet (20 mg total) by mouth daily. APPOINTMENT NEEDED FOR FURTHER REFILLS 30 tablet 0  . Esomeprazole Magnesium (NEXIUM PO) Take 22.3 mg by mouth daily.    Marland Kitchen gabapentin (NEURONTIN) 300 MG capsule Take 1 capsule (300 mg total) by mouth 3 (three) times daily. 1 cap PO QHS x 4 days, the increase to BID. (Patient taking differently: Take 300 mg by mouth 3 (three) times daily. ) 90 capsule 5  . lisinopril (PRINIVIL,ZESTRIL) 20 MG tablet Take 1 tablet (20 mg total) by mouth daily. APPOINTMENT NEEDED FOR FURTHER REFILLS 30 tablet 0  . loratadine (CLARITIN) 10 MG tablet Take 10 mg by mouth daily.    . methocarbamol (ROBAXIN) 500 MG tablet Take 1-2 tablets (500-1,000 mg total) by mouth every 6 (six) hours as needed for muscle spasms. 60 tablet 0  . oxyCODONE (OXY  IR/ROXICODONE) 5 MG immediate release tablet Take 1-2 tablets (5-10 mg total) by mouth every 3 (three) hours as needed for breakthrough pain. 90 tablet 0  . tadalafil (CIALIS) 20 MG tablet Take 0.5-1 tablets (10-20 mg total) by mouth every other day as needed for erectile dysfunction. (Patient not taking: Reported on 06/17/2016) 10 tablet PRN  . traZODone (DESYREL) 100 MG tablet Take 1 tablet (100 mg total) by mouth at bedtime. APPOINTMENT NEEDED FOR FURTHER REFILLS 30 tablet 0     ROS:                                                                                                                                       History obtained from the patient  General ROS: negative for - chills, fatigue, fever, night sweats, weight gain or weight loss Psychological ROS: negative for - behavioral disorder, hallucinations, memory difficulties, mood swings or suicidal ideation Ophthalmic ROS: negative for - blurry vision, double vision, eye pain or loss of vision ENT ROS: negative for - epistaxis, nasal discharge, oral lesions, sore throat, tinnitus or vertigo Allergy and Immunology ROS: negative for - hives or itchy/watery eyes Hematological and Lymphatic ROS: negative for - bleeding problems, bruising or swollen lymph nodes Endocrine ROS: negative for - galactorrhea, hair pattern changes, polydipsia/polyuria or temperature intolerance Respiratory ROS: negative for - cough, hemoptysis, shortness of breath or wheezing Cardiovascular ROS: negative for - chest pain, dyspnea on exertion, edema or irregular heartbeat Gastrointestinal ROS: negative for - abdominal pain, diarrhea, hematemesis,  nausea/vomiting or stool incontinence Genito-Urinary ROS: negative for - dysuria, hematuria, incontinence or urinary frequency/urgency Musculoskeletal ROS: negative for - joint swelling or muscular weakness Neurological ROS: as noted in HPI Dermatological ROS: negative for rash and skin lesion changes  Neurologic  Examination:                                                                                                      There were no vitals taken for this visit.  HEENT-  Normocephalic, no lesions, without obvious abnormality.  Normal external eye and conjunctiva.  Normal TM's bilaterally.  Normal auditory canals and external ears. Normal external nose, mucus membranes and septum.  Normal pharynx. Cardiovascular- S1, S2 normal, pulses palpable throughout   Lungs- chest clear, no wheezing, rales, normal symmetric air entry Abdomen- normal findings: bowel sounds normal Extremities- no edema Lymph-no adenopathy palpable Musculoskeletal-no joint tenderness, deformity or swelling Skin-warm and dry, no hyperpigmentation, vitiligo, or suspicious lesions  Neurological Examination Mental Status: Alert, oriented, thought content appropriate.  Speech intermittent dysarthia without evidence of aphasia.  Able to follow 3 step commands without difficulty. Cranial Nerves: II:  Visual fields grossly normal,  III,IV, VI: ptosis not present, extra-ocular motions intact bilaterally, pupils equal, round, reactive to light and accommodation V,VII: smile symmetric, facial light touch sensation normal bilaterally VIII: hearing normal bilaterally IX,X: uvula rises symmetrically XI: bilateral shoulder shrug XII: midline tongue extension Motor: Right : Upper extremity   5/5    Left:     Upper extremity   5/5  Lower extremity   5/5     Lower extremity   5/5 Tone and bulk:normal tone throughout; no atrophy noted Sensory: Pinprick and light touch stated to be decreased on the right Deep Tendon Reflexes: 1+ and symmetric throughout Plantars: Right: downgoing   Left: downgoing Cerebellar: normal finger-to-nose at times and others dysmetric ---inconsistent with poor effort at times Gait: not tested       Lab Results: Basic Metabolic Panel:  Recent Labs Lab 02/11/17 1441  NA 136  K 3.8  CL 99*  GLUCOSE  138*  BUN 15  CREATININE 1.30*    Liver Function Tests: No results for input(s): AST, ALT, ALKPHOS, BILITOT, PROT, ALBUMIN in the last 168 hours. No results for input(s): LIPASE, AMYLASE in the last 168 hours. No results for input(s): AMMONIA in the last 168 hours.  CBC:  Recent Labs Lab 02/11/17 1435 02/11/17 1441  WBC 12.2*  --   NEUTROABS 5.7  --   HGB 15.3 16.0  HCT 44.8 47.0  MCV 86.5  --   PLT 293  --     Cardiac Enzymes: No results for input(s): CKTOTAL, CKMB, CKMBINDEX, TROPONINI in the last 168 hours.  Lipid Panel: No results for input(s): CHOL, TRIG, HDL, CHOLHDL, VLDL, LDLCALC in the last 168 hours.  CBG: No results for input(s): GLUCAP in the last 168 hours.  Microbiology: Results for orders placed or performed during the hospital encounter of 06/05/16  Surgical pcr screen     Status: Abnormal   Collection Time: 06/05/16  8:38 AM  Result Value Ref Range Status   MRSA, PCR POSITIVE (A) NEGATIVE Final    Comment: RESULT CALLED TO, READ BACK BY AND VERIFIED WITH: York Spaniel RN 10:35 06/05/16 ([email or web address])    Staphylococcus aureus POSITIVE (A) NEGATIVE Final    Comment:        The Xpert SA Assay (FDA approved for NASAL specimens in patients over 110 years of age), is one component of a comprehensive surveillance program.  Test performance has been validated by [facility] for patients greater than or equal to 76 year old. It is not intended to diagnose infection nor to guide or monitor treatment.     Coagulation Studies: No results for input(s): LABPROT, INR in the last 72 hours.  Imaging: Ct Head Code Stroke Wo Contrast  Result Date: 02/11/2017 CLINICAL DATA:  Code stroke. Code stroke. Left-sided weakness and confusion. EXAM: CT HEAD WITHOUT CONTRAST TECHNIQUE: Contiguous axial images were obtained from the base of the skull through the vertex without intravenous contrast. COMPARISON:  MRI and CT 02/07/2016 FINDINGS: Brain: Mild to moderate  generalized brain atrophy. No evidence of old or acute focal infarction, mass lesion, hemorrhage, hydrocephalus or extra-axial collection. Vascular: No abnormal vascular finding. Skull: Negative Sinuses/Orbits: Clear/normal Other: None ASPECTS ([place] Stroke Program Early CT Score) - Ganglionic level infarction (caudate, lentiform nuclei, internal capsule, insula, M1-M3 cortex): 7 - Supraganglionic infarction (M4-M6 cortex): 3 Total score (0-10 with 10 being normal): 10 IMPRESSION: 1. Mild to moderate generalized atrophy. No focal insult. No acute finding. 2. ASPECTS is 10. These results were called by telephone at the time of interpretation on 02/11/2017 at 2:50 pm to Dr. Sherry Ruffing , who verbally acknowledged these results. Electronically Signed   By: Nelson Chimes M.D.   On: 02/11/2017 14:52       Assessment and plan discussed with with attending physician and they are in agreement.    Etta Quill PA-C [facility] [phone]  02/11/2017, 3:03 PM   Assessment: 56 y.o. male presenting to hospital as code stroke for dysarthria and left arm weakness noticed by EMS providers. Exam was most notably remarkable for ataxia, possibly worse on the left and dysarthria. CT head negative and CTA head and neck was obtained which showed no significant stenosis. The reason for his exam findings was likely as patient intoxicated as EtOH was 380.   Neurology will sign off. Thanks for consult

## 2017-02-11 NOTE — Code Documentation (Addendum)
56yo male arriving to [facility] via [place] at 27.  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  Patient reported headache and left sided weakness to a coworker at 1350.  Patient assessed by RN at that time.  EMS was called and while being checked out developed left arm weakness per report.  Code stroke activated and patient transported to the ED by [facility].  Stroke team at the bedside on patient arrival.  Labs drawn and patient cleared for CT by Dr. Sherry Ruffing.  Patient to CT with team.  CT and CTA completed.  NIHSS 5, see documentation for details and code stroke times.  Patient with mild dysarthria, bilateral ataxia and decreased sensation on the right side on exam.  Dr. Lorraine Lax at the bedside.  No acute stroke treatment at this time. Patient remains in the window to treat with tPA until 1820 should symptoms worsen.  Handoff with ED RN Hope.    ETOH level 387.  Dr. Lorraine Lax made aware.  Code stroke canceled.  ED RN Janett Billow updated via telephone.

## 2017-02-11 NOTE — ED Triage Notes (Signed)
Pt LSN 1350. Pt works for [employer] and had a sudden onset of headache 10/10, confusion, left sided weakness, double vision. Pt brought by [employer]. CBG 191, BP 191/100, HR 70

## 2017-02-12 ENCOUNTER — Encounter: Payer: Self-pay | Admitting: Family Medicine

## 2017-02-12 ENCOUNTER — Ambulatory Visit (INDEPENDENT_AMBULATORY_CARE_PROVIDER_SITE_OTHER): Payer: Managed Care, Other (non HMO) | Admitting: Family Medicine

## 2017-02-12 VITALS — BP 138/84 | HR 96 | Ht 70.0 in | Wt 248.0 lb

## 2017-02-12 DIAGNOSIS — F4310 Post-traumatic stress disorder, unspecified: Secondary | ICD-10-CM | POA: Diagnosis not present

## 2017-02-12 DIAGNOSIS — I10 Essential (primary) hypertension: Secondary | ICD-10-CM | POA: Diagnosis not present

## 2017-02-12 DIAGNOSIS — F1010 Alcohol abuse, uncomplicated: Secondary | ICD-10-CM | POA: Diagnosis not present

## 2017-02-12 MED ORDER — ESCITALOPRAM OXALATE 20 MG PO TABS: 20.0000 mg | ORAL_TABLET | Freq: Every day | ORAL | 1 refills | Status: DC

## 2017-02-12 MED ORDER — TRAZODONE HCL 100 MG PO TABS: 100.0000 mg | ORAL_TABLET | Freq: Every day | ORAL | 1 refills | Status: DC

## 2017-02-12 MED ORDER — LISINOPRIL 20 MG PO TABS: 20.0000 mg | ORAL_TABLET | Freq: Every day | ORAL | 1 refills | Status: DC

## 2017-02-12 MED ORDER — GABAPENTIN 300 MG PO CAPS: 300.0000 mg | ORAL_CAPSULE | Freq: Three times a day (TID) | ORAL | 5 refills | Status: DC

## 2017-02-12 NOTE — Progress Notes (Signed)
Subjective:    Patient ID: Jackson Sherman, male    DOB: 09-06-1960, 56 y.o.   MRN: [id number]  HPI 56 year old male comes in today for emergency department follow-up. Yesterday at work he became obtunded, diaphoretic and was experiencing some weakness so a coworker called EMS. They felt that he might have experienced a stroke. When he got to the hospital and they started to evaluate him his alcohol level came back elevated. They discontinue the code stroke. They did eventually discharge him home after he was feeling better. Today he feels like he is almost back to baseline. He just feels a little tired. He denies any extremity weakness today.  He did let me know that he's been getting changes in vision in his left eye. He says for several years now he's had intermittent vision changes which she describes as almost a prism in his vision. He says always start on the low end and then gradually go over his whole eyeball to the point where it very difficult for him to see his computer screen at work read. Sometimes it can be followed by mild headache but not always. He says it happened yesterday and that happened about a week ago. He has not had a formal vision exam in his entire life.   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  Hypertension- Pt denies chest pain, SOB, dizziness, or heart palpitations.  Taking meds as directed w/o problems.  Denies medication side effects.       Review of Systems  BP 138/84   Pulse 96   Ht 5\' 10"  (1.778 m)   Wt 248 lb (112.5 kg)   SpO2 98%   BMI 35.58 kg/m     No Known Allergies  Past Medical History:  Diagnosis Date  . 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 Depression   . GERD (gastroesophageal reflux disease)   . Hemorrhoids   . Hyperlipidemia   . Hypertension   . Seizures ([place])    ?? in August ...more related to ETOH rehab    Past Surgical History:  Procedure Laterality Date  . ARTHROSCOPIC  REPAIR ACL     left  . FRACTURE SURGERY     no surgery on broke thumb  . HEMORRHOID SURGERY N/A 11/23/2012   Procedure: HEMORRHOIDECTOMY;  Surgeon: Madilyn Hook, DO;  Location: [facility];  Service: General;  Laterality: N/A;  . TONSILLECTOMY     Age 58   . TOTAL KNEE ARTHROPLASTY Left 06/15/2016   Procedure: LEFT TOTAL KNEE ARTHROPLASTY;  Surgeon: Vickey Huger, MD;  Location: [facility];  Service: Orthopedics;  Laterality: Left;    Social History   Social History  . Marital status: Married    Spouse name: N/A  . Number of children: 4  . Years of education: N/A   Occupational History  .  [place] History Main Topics  . Smoking status: Former Smoker    Packs/day: 1.00    Years: 15.00    Types: Cigarettes    Quit date: 06/30/2003  . Smokeless tobacco: Never Used  . Alcohol use Yes     Comment: in current relapse  . Drug use: No  . Sexual activity: Yes    Partners: Female     Comment: married, 4 kids, works for [employer], trying to lose wt., started exercising program   Other Topics Concern  . Not on file   Social History  Narrative   No regular exercise.  3-4 cups per day of caffeine.      Family History  Problem Relation Age of Onset  . Heart disease Maternal Grandmother 54  . Stroke Maternal Grandmother   . Diabetes Maternal Grandmother   . Hyperlipidemia Mother   . Hypertension Mother   . Breast cancer Mother 72  . Depression Sister     Outpatient Encounter Prescriptions as of 02/12/2017  Medication Sig  . aspirin EC 325 MG EC tablet Take 1 tablet (325 mg total) by mouth 2 (two) times daily.  . diphenhydrAMINE (BENADRYL) 25 MG tablet Take 25 mg by mouth at bedtime as needed for sleep.   Marland Kitchen loratadine (CLARITIN) 10 MG tablet Take 10 mg by mouth daily.  . naproxen sodium (ALEVE) 220 MG tablet Take 220-440 mg by mouth 2 (two) times daily as needed (for pain).  Marland Kitchen omeprazole (PRILOSEC OTC) 20 MG tablet Take 20 mg by mouth daily before  breakfast.  . [DISCONTINUED] atorvastatin (LIPITOR) 20 MG tablet TAKE ONE TABLET BY MOUTH ONCE DAILY AT  6  PM (Patient taking differently: Take 20 mg by mouth at bedtime)  . escitalopram (LEXAPRO) 20 MG tablet Take 1 tablet (20 mg total) by mouth daily.  Marland Kitchen gabapentin (NEURONTIN) 300 MG capsule Take 1 capsule (300 mg total) by mouth 3 (three) times daily. 1 cap PO QHS x 4 days, the increase to BID.  Marland Kitchen lisinopril (PRINIVIL,ZESTRIL) 20 MG tablet Take 1 tablet (20 mg total) by mouth daily.  . traZODone (DESYREL) 100 MG tablet Take 1 tablet (100 mg total) by mouth at bedtime.  . [DISCONTINUED] escitalopram (LEXAPRO) 20 MG tablet Take 1 tablet (20 mg total) by mouth daily. APPOINTMENT NEEDED FOR FURTHER REFILLS (Patient not taking: Reported on 02/12/2017)  . [DISCONTINUED] gabapentin (NEURONTIN) 300 MG capsule Take 1 capsule (300 mg total) by mouth 3 (three) times daily. 1 cap PO QHS x 4 days, the increase to BID. (Patient not taking: Reported on 02/12/2017)  . [DISCONTINUED] lisinopril (PRINIVIL,ZESTRIL) 20 MG tablet Take 1 tablet (20 mg total) by mouth daily. APPOINTMENT NEEDED FOR FURTHER REFILLS (Patient not taking: Reported on 02/12/2017)  . [DISCONTINUED] methocarbamol (ROBAXIN) 500 MG tablet Take 1-2 tablets (500-1,000 mg total) by mouth every 6 (six) hours as needed for muscle spasms. (Patient not taking: Reported on 02/11/2017)  . [DISCONTINUED] oxyCODONE (OXY IR/ROXICODONE) 5 MG immediate release tablet Take 1-2 tablets (5-10 mg total) by mouth every 3 (three) hours as needed for breakthrough pain. (Patient not taking: Reported on 02/11/2017)  . [DISCONTINUED] tadalafil (CIALIS) 20 MG tablet Take 0.5-1 tablets (10-20 mg total) by mouth every other day as needed for erectile dysfunction. (Patient not taking: Reported on 06/17/2016)  . [DISCONTINUED] traZODone (DESYREL) 100 MG tablet Take 1 tablet (100 mg total) by mouth at bedtime. APPOINTMENT NEEDED FOR FURTHER REFILLS (Patient taking differently: Take  100 mg by mouth at bedtime. )   No facility-administered encounter medications on file as of 02/12/2017.          Objective:   Physical Exam  Constitutional: He is oriented to person, place, and time. He appears well-developed and well-nourished.  HENT:  Head: Normocephalic and atraumatic.  Cardiovascular: Normal rate, regular rhythm and normal heart sounds.   Pulmonary/Chest: Effort normal and breath sounds normal.  Neurological: He is alert and oriented to person, place, and time. He displays abnormal reflex. No cranial nerve deficit. He exhibits abnormal muscle tone. Coordination normal.  Normal heel to shin. Normal finger  to nose.   Skin: Skin is warm and dry.  Psychiatric: He has a normal mood and affect. His behavior is normal.          Assessment & Plan:  Alcohol abuse-did encourage him to get involved in a alcohol program. He would prefer to consider outpatient. I encouraged him to make some phone calls today as well as on Monday and decide what he is going to do an call me by Tuesday. I want him to have a definite timeframe that we can better no what we need to do in regards to [facility] for work. For now I'll go ahead and write him out for a week so that he can try to get started into a new a program. He is Artie been 34 rehabilitation program so far. He was most successful after he did the program at [facility]. He was actually sober for about 6 years after that program. Encouraged him to check into that again. At that time they were mostly night classes so he was still able to work.  Depression/anxiety-the HQ 9 score 14 and GAD 7 score of 12. Once he is through his rehabilitation program I really think very strongly that he would benefit from some personal therapy/counseling to address some of his depression and anxiety. He has a lot of issues at work. He rarely speaks up when there is a problem and said just tries to take care of the  problem.  hypertension-well-controlled.  Visual changes in the left eye-did encourage him to get a formal full eye exam as he has never had one in his entire life. If the exam is fairly normal except for maybe some vision change because of age then recommend consider referral to neurology. Suspect that he could be having ocular migraines.

## 2017-03-15 NOTE — Progress Notes (Signed)
Subjective:    CC: Alcohol abuse -   HPI:  Alcohol abuse - he is doing really well overall. He is back at work and working 2 days per week.  On his days off he's actually been going to [employer] twice a day. He is usually go into a lunch meeting at [facility] in the evening meeting. He feels like Amelia Jo brought in his support network has been very helpful for him.  Hyperlipidemia-he says that his Lipitor did not get refilled when he was here last time. Somehow it disappeared off his medication list. Last lipid panel was in 2017. He says he recently went for his eye exam was told that they could see evidence of high cholesterol in the backs of his eyes. He was also told that he has early cataract formation.   Past medical history, Surgical history, Family history not pertinant except as noted below, Social history, Allergies, and medications have been entered into the medical record, reviewed, and corrections made.   Review of Systems: No fevers, chills, night sweats, weight loss, chest pain, or shortness of breath.   Objective:    General: Well Developed, well nourished, and in no acute distress.  Neuro: Alert and oriented x3, extra-ocular muscles intact, sensation grossly intact.  HEENT: Normocephalic, atraumatic  Skin: Warm and dry, no rashes. Cardiac: Regular rate and rhythm, no murmurs rubs or gallops, no lower extremity edema.  Respiratory: Clear to auscultation bilaterally. Not using accessory muscles, speaking in full sentences.   Impression and Recommendations:     Alcohol abuse - stable. Right now he has been sober and has been doing really well. Keep up the [place] meetings. Like to check back when with him in about 3 months to make sure he is still doing well. GAD -7 score of 6.    Hyperlipidemia-due to recheck panel. Restart Lipitorncouraged him to get back on that for a couple of weeks before he goes for his lab work.

## 2017-03-16 ENCOUNTER — Ambulatory Visit (INDEPENDENT_AMBULATORY_CARE_PROVIDER_SITE_OTHER): Payer: Managed Care, Other (non HMO) | Admitting: Family Medicine

## 2017-03-16 ENCOUNTER — Encounter: Payer: Self-pay | Admitting: Family Medicine

## 2017-03-16 VITALS — BP 135/73 | HR 71 | Ht 70.0 in | Wt 257.0 lb

## 2017-03-16 DIAGNOSIS — E785 Hyperlipidemia, unspecified: Secondary | ICD-10-CM

## 2017-03-16 DIAGNOSIS — Z23 Encounter for immunization: Secondary | ICD-10-CM | POA: Diagnosis not present

## 2017-03-16 DIAGNOSIS — F1010 Alcohol abuse, uncomplicated: Secondary | ICD-10-CM | POA: Diagnosis not present

## 2017-03-16 MED ORDER — ATORVASTATIN CALCIUM 20 MG PO TABS: ORAL_TABLET | ORAL | 1 refills | Status: DC

## 2017-03-16 MED ORDER — ATORVASTATIN CALCIUM 20 MG PO TABS: ORAL_TABLET | ORAL | 3 refills | Status: DC

## 2017-05-25 ENCOUNTER — Ambulatory Visit (INDEPENDENT_AMBULATORY_CARE_PROVIDER_SITE_OTHER): Payer: Managed Care, Other (non HMO) | Admitting: Family Medicine

## 2017-05-25 ENCOUNTER — Encounter: Payer: Self-pay | Admitting: Family Medicine

## 2017-05-25 VITALS — BP 120/84 | HR 81 | Temp 99.0°F | Ht 70.0 in | Wt 258.0 lb

## 2017-05-25 DIAGNOSIS — J40 Bronchitis, not specified as acute or chronic: Secondary | ICD-10-CM

## 2017-05-25 DIAGNOSIS — J329 Chronic sinusitis, unspecified: Secondary | ICD-10-CM | POA: Diagnosis not present

## 2017-05-25 MED ORDER — AZITHROMYCIN 250 MG PO TABS: ORAL_TABLET | ORAL | 0 refills | Status: AC

## 2017-05-25 NOTE — Patient Instructions (Addendum)
Upper Respiratory Infection, Adult Most upper respiratory infections (URIs) are caused by a virus. A URI affects the nose, throat, and upper air passages. The most common type of URI is often called "the common cold." Follow these instructions at home:  Take medicines only as told by your doctor.  Gargle warm saltwater or take cough drops to comfort your throat as told by your doctor.  Use a warm mist humidifier or inhale steam from a shower to increase air moisture. This may make it easier to breathe.  Drink enough fluid to keep your pee (urine) clear or pale yellow.  Eat soups and other clear broths.  Have a healthy diet.  Rest as needed.  Go back to work when your fever is gone or your doctor says it is okay. ? You may need to stay home longer to avoid giving your URI to others. ? You can also wear a face mask and wash your hands often to prevent spread of the virus.  Use your inhaler more if you have asthma.  Do not use any tobacco products, including cigarettes, chewing tobacco, or electronic cigarettes. If you need help quitting, ask your doctor. Contact a doctor if:  You are getting worse, not better.  Your symptoms are not helped by medicine.  You have chills.  You are getting more short of breath.  You have brown or red mucus.  You have yellow or brown discharge from your nose.  You have pain in your face, especially when you bend forward.  You have a fever.  You have puffy (swollen) neck glands.  You have pain while swallowing.  You have white areas in the back of your throat. Get help right away if:  You have very bad or constant: ? Headache. ? Ear pain. ? Pain in your forehead, behind your eyes, and over your cheekbones (sinus pain). ? Chest pain.  You have long-lasting (chronic) lung disease and any of the following: ? Wheezing. ? Long-lasting cough. ? Coughing up blood. ? A change in your usual mucus.  You have a stiff neck.  You have  changes in your: ? Vision. ? Hearing. ? Thinking. ? Mood. This information is not intended to replace advice given to you by your health care provider. Make sure you discuss any questions you have with your health care provider. Document Released: 12/02/2007 Document Revised: 02/16/2016 Document Reviewed: 09/20/2013 Elsevier Interactive Patient Education  2018 Elsevier Inc.  

## 2017-05-25 NOTE — Progress Notes (Signed)
   Subjective:    Patient ID: DAIMIAN SUDBERRY, male    DOB: 1961/03/11, 56 y.o.   MRN: [id number]  HPI 56 yo male comes in today complaing of cough, runny nose x 1 week. Cough sounds wet but not getting a lot of sputum up.  No  chills or sweats.  Did have a temp to 99 one day. Taking Dayquail in AM and Mucinex at night.  NO ST. some mild nasal congestion and runny nose.  No facial pressure or headache.   Review of Systems     Objective:   Physical Exam  Constitutional: He is oriented to person, place, and time. He appears well-developed and well-nourished.  HENT:  Head: Normocephalic and atraumatic.  Right Ear: External ear normal.  Left Ear: External ear normal.  Nose: Nose normal.  Mouth/Throat: Oropharynx is clear and moist.  TMs and canals are clear. Sounds nasally.   Eyes: Conjunctivae and EOM are normal. Pupils are equal, round, and reactive to light.  Neck: Neck supple. No thyromegaly present.  Cardiovascular: Normal rate and normal heart sounds.  Pulmonary/Chest: Effort normal and breath sounds normal.  Diffuse ronchi  Lymphadenopathy:    He has no cervical adenopathy.  Neurological: He is alert and oriented to person, place, and time.  Skin: Skin is warm and dry.  Psychiatric: He has a normal mood and affect.          Assessment & Plan:  Sinobronchitis-at this point I still think likely viral that he is Artie been sick for a week.  Encouraged him to get a couple more days but if he is not improving or if he suddenly gets worse and okay to fill up her scription for azithromycin.  Okay to continue with symptom Medicare.  Call if getting worse or noticing shortness of breath.

## 2017-10-15 ENCOUNTER — Telehealth: Payer: No Typology Code available for payment source | Admitting: Nurse Practitioner

## 2017-10-15 DIAGNOSIS — R059 Cough, unspecified: Secondary | ICD-10-CM

## 2017-10-15 DIAGNOSIS — R05 Cough: Secondary | ICD-10-CM

## 2017-10-15 MED ORDER — DOXYCYCLINE HYCLATE 100 MG PO TABS: 100.0000 mg | ORAL_TABLET | Freq: Two times a day (BID) | ORAL | 0 refills | Status: DC

## 2017-10-15 MED ORDER — BENZONATATE 100 MG PO CAPS: 100.0000 mg | ORAL_CAPSULE | Freq: Three times a day (TID) | ORAL | 0 refills | Status: DC | PRN

## 2017-10-15 NOTE — Progress Notes (Signed)

## 2018-02-06 ENCOUNTER — Ambulatory Visit (HOSPITAL_COMMUNITY)
Admission: EM | Admit: 2018-02-06 | Discharge: 2018-02-06 | Disposition: A | Discharge status: Home / Self Care | Payer: No Typology Code available for payment source | Attending: Family Medicine | Admitting: Family Medicine

## 2018-02-06 ENCOUNTER — Encounter (HOSPITAL_COMMUNITY): Payer: Self-pay

## 2018-02-06 ENCOUNTER — Other Ambulatory Visit: Payer: Self-pay

## 2018-02-06 DIAGNOSIS — L03115 Cellulitis of right lower limb: Secondary | ICD-10-CM

## 2018-02-06 MED ORDER — AMOXICILLIN-POT CLAVULANATE 875-125 MG PO TABS: 1.0000 | ORAL_TABLET | Freq: Two times a day (BID) | ORAL | 0 refills | Status: DC

## 2018-02-06 NOTE — ED Provider Notes (Signed)
[place]    CSN: [id number] Arrival date & time: 02/06/18  1012     History   Chief Complaint Chief Complaint  Patient presents with  . Foot Pain    HPI Jackson Sherman is a 57 y.o. male.   HPI 57 year old gentleman presents with right foot pain for the last 6 days.   Initially, patient believes that he was bitten by a mosquito or an insect.  His foot was itchy and he scratched it on the dorsal lateral aspect.  Subsequently, the foot has gotten more swollen and red, and tender.  He has some soreness in the muscles because of the change in his gait.  He does not have any pain in the MTP joint.  Patient works as a Counsellor for [employer] Past Medical History:  Diagnosis Date  . 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 Depression   . GERD (gastroesophageal reflux disease)   . Hemorrhoids   . Hyperlipidemia   . Hypertension   . Seizures ([place])    ?? in August ...more related to ETOH rehab    Patient Active Problem List   Diagnosis Date Noted  . Chronic left shoulder pain 12/22/2016  . 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 Chronic pain of left knee 04/09/2016  . Erectile dysfunction 03/16/2016  . PTSD (post-traumatic stress disorder) 03/16/2016  . Altered mental status 02/07/2016  . Seizure ([place])   . Alcohol abuse   . Elevated uric acid in blood 09/25/2015  . Cellulitis 09/24/2015  . Depression 08/22/2012  . Insomnia 05/06/2010  . Hyperlipidemia 04/06/2006  . OBESITY, NOS 04/06/2006  . ALCOHOL DEPENDENCE 04/06/2006  . HYPERTENSION, BENIGN SYSTEMIC 04/06/2006    Past Surgical History:  Procedure Laterality Date  . ARTHROSCOPIC REPAIR ACL     left  . FRACTURE SURGERY     no surgery on broke thumb  . HEMORRHOID SURGERY N/A 11/23/2012   Procedure: HEMORRHOIDECTOMY;  Surgeon: Madilyn Hook, DO;  Location: [facility];  Service: General;  Laterality: N/A;  . TONSILLECTOMY     Age 62   . TOTAL KNEE ARTHROPLASTY Left 06/15/2016   Procedure: LEFT  TOTAL KNEE ARTHROPLASTY;  Surgeon: Vickey Huger, MD;  Location: [facility];  Service: Orthopedics;  Laterality: Left;       Home Medications    Prior to Admission medications   Medication Sig Start Date End Date Taking? Authorizing Provider  amoxicillin-clavulanate (AUGMENTIN) 875-125 MG tablet Take 1 tablet by mouth every 12 (twelve) hours. 02/06/18   Robyn Haber, MD  atorvastatin (LIPITOR) 20 MG tablet Take 20 mg by mouth at bedtime 03/16/17   [name], MD  diphenhydrAMINE (BENADRYL) 25 MG tablet Take 25 mg by mouth at bedtime as needed for sleep.     [provider]  escitalopram (LEXAPRO) 20 MG tablet Take 1 tablet (20 mg total) by mouth daily. 02/12/17   [name], MD  gabapentin (NEURONTIN) 300 MG capsule Take 1 capsule (300 mg total) by mouth 3 (three) times daily. 1 cap PO QHS x 4 days, the increase to BID. 02/12/17   [name], MD  lisinopril (PRINIVIL,ZESTRIL) 20 MG tablet Take 1 tablet (20 mg total) by mouth daily. 02/12/17   [name], MD  loratadine (CLARITIN) 10 MG tablet Take 10 mg by mouth daily.    [provider]  naproxen sodium (ALEVE) 220 MG tablet Take 220-440 mg by mouth 2 (two) times daily as needed (for pain).    [provider]  omeprazole (PRILOSEC OTC) 20 MG tablet Take 20 mg by mouth daily before breakfast.    [provider]  traZODone (DESYREL) 100 MG tablet Take 1 tablet (100 mg total) by mouth at bedtime. 02/12/17   [name], MD    Family History Family History  Problem Relation Age of Onset  . Heart disease Maternal Grandmother 23  . Stroke Maternal Grandmother   . Diabetes Maternal Grandmother   . Hyperlipidemia Mother   . Hypertension Mother   . Breast cancer Mother 21  . Depression Sister     Social History Social History   Tobacco Use  . Smoking status: Former Smoker    Packs/day: 1.00    Years: 15.00    Pack years: 15.00    Types: Cigarettes     Last attempt to quit: 06/30/2003    Years since quitting: 14.6  . Smokeless tobacco: Never Used  Substance Use Topics  . Alcohol use: Yes    Comment: in current relapse  . Drug use: No     Allergies   Patient has no known allergies.   Review of Systems Review of Systems  Constitutional: Negative.   HENT: Negative.   Respiratory: Negative.   Cardiovascular: Negative.   Musculoskeletal: Positive for gait problem and myalgias.  Skin: Positive for rash and wound.     Physical Exam Triage Vital Signs ED Triage Vitals  Enc Vitals Group     BP 02/06/18 1037 (!) 144/89     Pulse Rate 02/06/18 1037 72     Resp 02/06/18 1037 16     Temp 02/06/18 1037 98.2 F (36.8 C)     Temp src --      SpO2 02/06/18 1037 99 %     Weight 02/06/18 1046 253 lb (114.8 kg)     Height --      Head Circumference --      Peak Flow --      Pain Score 02/06/18 1046 6     Pain Loc --      Pain Edu? --      Excl. in [place]? --    No data found.  Updated Vital Signs BP (!) 144/89 (BP Location: Right Arm)   Pulse 72   Temp 98.2 F (36.8 C)   Resp 16   Wt 114.8 kg   SpO2 99%   BMI 36.30 kg/m   Physical Exam  Constitutional: He is oriented to person, place, and time. He appears well-developed.  HENT:  Head: Normocephalic and atraumatic.  Eyes: Pupils are equal, round, and reactive to light.  Neck: Normal range of motion. Neck supple.  Pulmonary/Chest: Effort normal.  Musculoskeletal: He exhibits edema. He exhibits no deformity.  Neurological: He is alert and oriented to person, place, and time.  Skin: Skin is warm and dry. Rash noted.  Patient has an open sore measuring about 1 cm on the dorsal lateral aspect of his right anterior ankle.  The dorsal part of his foot is mildly tender to touch, warm, and mildly erythematous.  Patient has full range of motion of his foot with no joint swelling or tenderness  Psychiatric: He has a normal mood and affect.  Nursing note and vitals  reviewed.    UC Treatments / Results  Labs (all labs ordered are listed, but only abnormal results are displayed) Labs Reviewed - No data to display  EKG None  Radiology No results found.  Procedures Procedures (including critical care time)  Medications Ordered in UC Medications - No data to display  Initial Impression / Assessment and Plan / UC Course  I have reviewed the triage vital signs and the nursing notes.  Pertinent labs & imaging results that were available during my care of the patient were reviewed by me and considered in my medical decision making (see chart for details).      Final Clinical Impressions(s) / UC Diagnoses   Final diagnoses:  Cellulitis of right lower extremity   Discharge Instructions   None    ED Prescriptions    Medication Sig Dispense Auth. Provider   amoxicillin-clavulanate (AUGMENTIN) 875-125 MG tablet Take 1 tablet by mouth every 12 (twelve) hours. 14 tablet Robyn Haber, MD     Controlled Substance Prescriptions [place] Controlled Substance Registry consulted? Not Applicable   Robyn Haber, MD 02/06/18 1103

## 2018-02-06 NOTE — ED Triage Notes (Signed)
Swelling and painful ( right foot) 6 days

## 2018-02-15 ENCOUNTER — Ambulatory Visit (INDEPENDENT_AMBULATORY_CARE_PROVIDER_SITE_OTHER): Payer: Self-pay | Admitting: Family Medicine

## 2018-02-15 ENCOUNTER — Encounter: Payer: Self-pay | Admitting: Family Medicine

## 2018-02-15 VITALS — BP 119/85 | HR 86 | Ht 70.0 in | Wt 257.0 lb

## 2018-02-15 DIAGNOSIS — Z125 Encounter for screening for malignant neoplasm of prostate: Secondary | ICD-10-CM

## 2018-02-15 DIAGNOSIS — E785 Hyperlipidemia, unspecified: Secondary | ICD-10-CM

## 2018-02-15 DIAGNOSIS — L03115 Cellulitis of right lower limb: Secondary | ICD-10-CM

## 2018-02-15 DIAGNOSIS — I10 Essential (primary) hypertension: Secondary | ICD-10-CM

## 2018-02-15 MED ORDER — GABAPENTIN 300 MG PO CAPS: 300.0000 mg | ORAL_CAPSULE | Freq: Three times a day (TID) | ORAL | 5 refills | Status: DC

## 2018-02-15 MED ORDER — LISINOPRIL 20 MG PO TABS: 20.0000 mg | ORAL_TABLET | Freq: Every day | ORAL | 1 refills | Status: DC

## 2018-02-15 MED ORDER — TRAZODONE HCL 100 MG PO TABS: 100.0000 mg | ORAL_TABLET | Freq: Every day | ORAL | 1 refills | Status: DC

## 2018-02-15 MED ORDER — ATORVASTATIN CALCIUM 20 MG PO TABS: ORAL_TABLET | ORAL | 3 refills | Status: DC

## 2018-02-15 MED ORDER — ESCITALOPRAM OXALATE 20 MG PO TABS: 20.0000 mg | ORAL_TABLET | Freq: Every day | ORAL | 1 refills | Status: DC

## 2018-02-15 NOTE — Progress Notes (Signed)
Subjective:    CC: F/U ED visit for cellulitis   HPI:  57 year old male is here today to follow-up from recent urgent care visit.  He had a bug bite around his right outer ankle and thinks he may have scratched it in his sleep.  It started to get infected and the erythema started to spread from the lateral part of his foot over to the medial part of his foot.  He went to urgent care on August 11 and was placed on amoxicillin.  He did complete his antibiotics 2 days ago and says that it looks completely better.  He still had a little bit of tenderness over the ball of his foot but says it feels better even today compared to yesterday.  No fevers chills or sweats.  Hypertension- Pt denies chest pain, SOB, dizziness, or heart palpitations.  Taking meds as directed w/o problems.  Denies medication side effects.    Hyperlipidemia-currently on atorvastatin without any problems or side effects.  Past medical history, Surgical history, Family history not pertinant except as noted below, Social history, Allergies, and medications have been entered into the medical record, reviewed, and corrections made.   Review of Systems: No fevers, chills, night sweats, weight loss, chest pain, or shortness of breath.   Objective:    General: Well Developed, well nourished, and in no acute distress.  Neuro: Alert and oriented x3, extra-ocular muscles intact, sensation grossly intact.  HEENT: Normocephalic, atraumatic  Skin: Warm and dry, no rashes. Cardiac: Regular rate and rhythm, no murmurs rubs or gallops, no lower extremity edema.  Respiratory: Clear to auscultation bilaterally. Not using accessory muscles, speaking in full sentences.   Impression and Recommendations:    HTN - Well controlled. Continue current regimen. Follow up in  6 months.    Right foot cellulitis -looks like it is completely resolved.  He still has a little discomfort in the foot but it seems to be getting better every day.  Please  check skin carefully daily and if he starts to notice any returning erythema please let us know ASAP.  Lipidemia-due to recheck lipid panel.  Refills sent for the year.  PSA screening test ordered.

## 2018-02-16 LAB — COMPLETE METABOLIC PANEL WITH GFR
AG Ratio: 1.2 (calc) (ref 1.0–2.5)
ALKALINE PHOSPHATASE (APISO): 80 U/L (ref 40–115)
ALT: 23 U/L (ref 9–46)
AST: 19 U/L (ref 10–35)
Albumin: 4 g/dL (ref 3.6–5.1)
BUN: 20 mg/dL (ref 7–25)
CALCIUM: 9.3 mg/dL (ref 8.6–10.3)
CO2: 23 mmol/L (ref 20–32)
CREATININE: 1.04 mg/dL (ref 0.70–1.33)
Chloride: 100 mmol/L (ref 98–110)
GFR, EST NON AFRICAN AMERICAN: 79 mL/min/{1.73_m2} (ref >=60)
GFR, Est African American: 91 mL/min/{1.73_m2} (ref >=60)
GLOBULIN: 3.4 g/dL (ref 1.9–3.7)
Glucose, Bld: 98 mg/dL (ref 65–99)
POTASSIUM: 4.7 mmol/L (ref 3.5–5.3)
SODIUM: 135 mmol/L (ref 135–146)
Total Bilirubin: 0.5 mg/dL (ref 0.2–1.2)
Total Protein: 7.4 g/dL (ref 6.1–8.1)

## 2018-02-16 LAB — LIPID PANEL
Cholesterol: 214 mg/dL — ABNORMAL HIGH (ref <200)
HDL: 40 mg/dL — AB (ref >40)
LDL Cholesterol (Calc): 151 mg/dL (calc) — ABNORMAL HIGH
NON-HDL CHOLESTEROL (CALC): 174 mg/dL — AB (ref <130)
Total CHOL/HDL Ratio: 5.4 (calc) — ABNORMAL HIGH (ref <5.0)
Triglycerides: 110 mg/dL (ref <150)

## 2018-02-16 LAB — PSA: PSA: 0.4 ng/mL (ref <=4.0)

## 2018-07-04 ENCOUNTER — Telehealth: Payer: Self-pay

## 2018-07-04 DIAGNOSIS — Z6836 Body mass index (BMI) 36.0-36.9, adult: Principal | ICD-10-CM

## 2018-07-04 DIAGNOSIS — E6609 Other obesity due to excess calories: Secondary | ICD-10-CM

## 2018-07-04 NOTE — Telephone Encounter (Signed)
Referral placed.

## 2018-07-04 NOTE — Telephone Encounter (Signed)
Ok to place. Thank you1

## 2018-07-04 NOTE — Telephone Encounter (Signed)
Pt called stating he is on [facility] plan and is wanting to go to weight management, therefore needs referral.  Pt requesting referral be place for [facility] with Dr Dennard Nip   OK to place?

## 2018-08-04 ENCOUNTER — Ambulatory Visit (INDEPENDENT_AMBULATORY_CARE_PROVIDER_SITE_OTHER): Payer: No Typology Code available for payment source

## 2018-08-04 ENCOUNTER — Ambulatory Visit (INDEPENDENT_AMBULATORY_CARE_PROVIDER_SITE_OTHER): Payer: No Typology Code available for payment source | Admitting: Family Medicine

## 2018-08-04 ENCOUNTER — Encounter: Payer: Self-pay | Admitting: Family Medicine

## 2018-08-04 VITALS — BP 120/74 | HR 67 | Ht 70.0 in | Wt 265.0 lb

## 2018-08-04 DIAGNOSIS — M25551 Pain in right hip: Secondary | ICD-10-CM

## 2018-08-04 DIAGNOSIS — Z0000 Encounter for general adult medical examination without abnormal findings: Secondary | ICD-10-CM | POA: Diagnosis not present

## 2018-08-04 DIAGNOSIS — N529 Male erectile dysfunction, unspecified: Secondary | ICD-10-CM | POA: Diagnosis not present

## 2018-08-04 MED ORDER — LOSARTAN POTASSIUM 50 MG PO TABS: 50.0000 mg | ORAL_TABLET | Freq: Every day | ORAL | 1 refills | Status: DC

## 2018-08-04 MED ORDER — SILDENAFIL CITRATE 20 MG PO TABS: 40.0000 mg | ORAL_TABLET | Freq: Every day | ORAL | 4 refills | Status: DC | PRN

## 2018-08-04 MED FILL — LOSARTAN POTASSIUM 50 MG TA: 50 | 90 days supply | Qty: 90 | Fill #0

## 2018-08-04 NOTE — Progress Notes (Signed)
CPE-  Established Patient Office Visit  Subjective:  Patient ID: Jackson Sherman, male    DOB: Nov 12, 1960  Age: 58 y.o. MRN: [id number]  CC:  Chief Complaint  Patient presents with  . Annual Exam    HPI Jackson Sherman presents for yearly wellness exam.  He has a history of hypertension hyperlipidemia and alcohol dependence and depression.  Last set of labs including lipids was in August of this year.  He received his flu shot through work and is up-to-date on his tetanus.  Last PSA was in August as well as lipid screening.  He more recently has started increasing his activity level so he is been doing some exercise but knows he needs to work on it and try to lose some weight.  He did have a couple concerns today that he would like to address.  Erectile dysfunction-he thinks the lisinopril is worsening his symptoms.  He notices that if he does not take the medication for couple days it makes a big difference.  He would like to consider trying some taking something else.  Also previously discussed medication.  He tried to fill the prescription for Cialis but it was too costly.  He also complains of right hip pain is been going on for months.  He describes it as sometimes stiff and a most sore sometimes is difficult to just get up from a sitting position.  He denies any locking or clicking.  He says sometimes it feels weak like it is going to give out on him.  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  He said it would be painful almost like he had dislocated it.  Health Maintenance  Topic Date Due  . COLONOSCOPY  06/13/2023  . TETANUS/TDAP  09/23/2025  . INFLUENZA VACCINE  Completed  . Hepatitis C Screening  Completed  . HIV Screening  Completed     Past Medical History:  Diagnosis Date  . 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 Depression   . GERD (gastroesophageal reflux  disease)   . Hemorrhoids   . Hyperlipidemia   . Hypertension   . Seizures ([place])    ?? in August ...more related to ETOH rehab    Past Surgical History:  Procedure Laterality Date  . ARTHROSCOPIC REPAIR ACL     left  . FRACTURE SURGERY     no surgery on broke thumb  . HEMORRHOID SURGERY N/A 11/23/2012   Procedure: HEMORRHOIDECTOMY;  Surgeon: Madilyn Hook, DO;  Location: [facility];  Service: General;  Laterality: N/A;  . TONSILLECTOMY     Age 54   . TOTAL KNEE ARTHROPLASTY Left 06/15/2016   Procedure: LEFT TOTAL KNEE ARTHROPLASTY;  Surgeon: Vickey Huger, MD;  Location: [facility];  Service: Orthopedics;  Laterality: Left;    Family History  Problem Relation Age of Onset  . Heart disease Maternal Grandmother 13  . Stroke Maternal Grandmother   . Diabetes Maternal Grandmother   . Hyperlipidemia Mother   . Hypertension Mother   . Breast cancer Mother 37  . Depression Sister   . Diabetes Sister   . Diabetes Brother   . Diabetes Paternal Grandmother   . Stroke Paternal Grandmother     Social History   Socioeconomic History  . Marital status: Married    Spouse name: Not on file  . Number of children: 4  . Years of  education: Not on file  . Highest education level: Not on file  Occupational History    Employer: [employer]  Social Needs  . Financial resource strain: Not on file  . Food insecurity:    Worry: Not on file    Inability: Not on file  . Transportation needs:    Medical: Not on file    Non-medical: Not on file  Tobacco Use  . Smoking status: Former Smoker    Packs/day: 1.00    Years: 15.00    Pack years: 15.00    Types: Cigarettes    Last attempt to quit: 06/30/2003    Years since quitting: 15.1  . Smokeless tobacco: Never Used  Substance and Sexual Activity  . Alcohol use: Yes    Comment: in current relapse  . Drug use: No  . Sexual activity: Yes    Partners: Female    Comment: married, 4 kids, works for [employer], trying to lose wt.,  started exercising program  Lifestyle  . Physical activity:    Days per week: Not on file    Minutes per session: Not on file  . Stress: Not on file  Relationships  . Social connections:    Talks on phone: Not on file    Gets together: Not on file    Attends religious service: Not on file    Active member of club or organization: Not on file    Attends meetings of clubs or organizations: Not on file    Relationship status: Not on file  . Intimate partner violence:    Fear of current or ex partner: Not on file    Emotionally abused: Not on file    Physically abused: Not on file    Forced sexual activity: Not on file  Other Topics Concern  . Not on file  Social History Narrative   No regular exercise.  3-4 cups per day of caffeine.      Outpatient Medications Prior to Visit  Medication Sig Dispense Refill  . atorvastatin (LIPITOR) 20 MG tablet Take 20 mg by mouth at bedtime 90 tablet 3  . diphenhydrAMINE (BENADRYL) 25 MG tablet Take 25 mg by mouth at bedtime as needed for sleep.     Marland Kitchen escitalopram (LEXAPRO) 20 MG tablet Take 1 tablet (20 mg total) by mouth daily. 90 tablet 1  . gabapentin (NEURONTIN) 300 MG capsule Take 1 capsule (300 mg total) by mouth 3 (three) times daily. 1 cap PO QHS x 4 days, the increase to BID. 90 capsule 5  . loratadine (CLARITIN) 10 MG tablet Take 10 mg by mouth daily.    . naproxen sodium (ALEVE) 220 MG tablet Take 220-440 mg by mouth 2 (two) times daily as needed (for pain).    Marland Kitchen omeprazole (PRILOSEC OTC) 20 MG tablet Take 20 mg by mouth daily before breakfast.    . traZODone (DESYREL) 100 MG tablet Take 1 tablet (100 mg total) by mouth at bedtime. 90 tablet 1  . lisinopril (PRINIVIL,ZESTRIL) 20 MG tablet Take 1 tablet (20 mg total) by mouth daily. 90 tablet 1   No facility-administered medications prior to visit.     No Known Allergies  ROS Review of Systems    Objective:    Physical Exam  Constitutional: He is oriented to person, place,  and time. He appears well-developed and well-nourished.  HENT:  Head: Normocephalic and atraumatic.  Right Ear: External ear normal.  Left Ear: External ear normal.  Nose: Nose normal.  Mouth/Throat: Oropharynx is clear and moist.  Eyes: Pupils are equal, round, and reactive to light. Conjunctivae and EOM are normal.  Neck: Normal range of motion. Neck supple. No thyromegaly present.  Cardiovascular: Normal rate, regular rhythm, normal heart sounds and intact distal pulses.  Pulmonary/Chest: Effort normal and breath sounds normal.  Abdominal: Soft. Bowel sounds are normal. He exhibits no distension and no mass. There is no abdominal tenderness. There is no rebound and no guarding.  Musculoskeletal: Normal range of motion.     Comments: Pain in the right hip with internal rotation.  Some mild pain with external rotation but much more painful with internal rotation.  Strength is 5/5 at the hip and knee.  Lymphadenopathy:    He has no cervical adenopathy.  Neurological: He is alert and oriented to person, place, and time. He has normal reflexes.  Skin: Skin is warm and dry.  Psychiatric: He has a normal mood and affect. His behavior is normal. Judgment and thought content normal.    BP 120/74   Pulse 67   Ht 5\' 10"  (1.778 m)   Wt 265 lb (120.2 kg)   SpO2 98%   BMI 38.02 kg/m  Wt Readings from Last 3 Encounters:  08/04/18 265 lb (120.2 kg)  02/15/18 257 lb (116.6 kg)  02/06/18 253 lb (114.8 kg)     There are no preventive care reminders to display for this patient.  There are no preventive care reminders to display for this patient.  No results found for: TSH Lab Results  Component Value Date   WBC 12.2 (H) 02/11/2017   HGB 16.0 02/11/2017   HCT 47.0 02/11/2017   MCV 86.5 02/11/2017   PLT 293 02/11/2017   Lab Results  Component Value Date   NA 135 02/15/2018   K 4.7 02/15/2018   CO2 23 02/15/2018   GLUCOSE 98 02/15/2018   BUN 20 02/15/2018   CREATININE 1.04  02/15/2018   BILITOT 0.5 02/15/2018   ALKPHOS 75 02/11/2017   AST 19 02/15/2018   ALT 23 02/15/2018   PROT 7.4 02/15/2018   ALBUMIN 3.9 02/11/2017   CALCIUM 9.3 02/15/2018   ANIONGAP 15 02/11/2017   Lab Results  Component Value Date   CHOL 214 (H) 02/15/2018   Lab Results  Component Value Date   HDL 40 (L) 02/15/2018   Lab Results  Component Value Date   LDLCALC 151 (H) 02/15/2018   Lab Results  Component Value Date   TRIG 110 02/15/2018   Lab Results  Component Value Date   CHOLHDL 5.4 (H) 02/15/2018   No results found for: HGBA1C    Assessment & Plan:   Problem List Items Addressed This Visit      Other   Right hip pain   Relevant Orders   DG HIP UNILAT WITH PELVIS 2-3 VIEWS RIGHT   Erectile dysfunction    Other Visit Diagnoses    Wellness examination    -  Primary   Relevant Orders   BASIC METABOLIC PANEL WITH GFR   Hemoglobin A1c      Keep up a regular exercise program and make sure you are eating a healthy diet, Encouraged him to work on losing weight.   Try to eat 4 servings of dairy a day, or if you are lactose intolerant take a calcium with vitamin D daily.  Your vaccines are up to date.  Also like to be screened for diabetes as he has 2 siblings and  grandparents who have been diagnosed.  Right hip pain-we will work-up further with x-ray.  He does have pain with internal rotation of the hip and some with external but more painful with internal rotation.  Suspect osteoarthritis.  Erectile dysfunction-we will discontinue lisinopril and switch to losartan.  Reminded him that all blood pressure pills have a side effect of potentially worsening erectile dysfunction but we could certainly change medications and see if he tolerates this 1 better.  Will send over new prescription for losartan 50 mg.  We also discussed trying the low-dose sildenafil which might be more cost effective since he was unable to afford the Cialis.  Meds ordered this encounter   Medications  . losartan (COZAAR) 50 MG tablet    Sig: Take 1 tablet (50 mg total) by mouth daily.    Dispense:  90 tablet    Refill:  1  . sildenafil (REVATIO) 20 MG tablet    Sig: Take 2-5 tablets (40-100 mg total) by mouth daily as needed.    Dispense:  50 tablet    Refill:  4    Follow-up: Return in about 6 months (around 02/02/2019) for recheck on medication and Bp .    Beatrice Lecher, MD

## 2018-08-04 NOTE — Patient Instructions (Signed)

## 2018-08-05 LAB — BASIC METABOLIC PANEL WITH GFR
BUN: 18 mg/dL (ref 7–25)
CALCIUM: 8.8 mg/dL (ref 8.6–10.3)
CO2: 28 mmol/L (ref 20–32)
Chloride: 104 mmol/L (ref 98–110)
Creat: 1.05 mg/dL (ref 0.70–1.33)
GFR, EST AFRICAN AMERICAN: 90 mL/min/{1.73_m2} (ref >=60)
GFR, Est Non African American: 78 mL/min/{1.73_m2} (ref >=60)
Glucose, Bld: 86 mg/dL (ref 65–99)
Potassium: 4.6 mmol/L (ref 3.5–5.3)
Sodium: 140 mmol/L (ref 135–146)

## 2018-08-05 LAB — HEMOGLOBIN A1C
EAG (MMOL/L): 6.6 (calc)
Hgb A1c MFr Bld: 5.8 % of total Hgb — ABNORMAL HIGH (ref <5.7)
Mean Plasma Glucose: 120 (calc)

## 2018-08-09 ENCOUNTER — Encounter: Payer: Self-pay | Admitting: Family Medicine

## 2018-08-09 ENCOUNTER — Ambulatory Visit (INDEPENDENT_AMBULATORY_CARE_PROVIDER_SITE_OTHER): Payer: No Typology Code available for payment source | Admitting: Family Medicine

## 2018-08-09 VITALS — BP 127/86 | HR 72 | Ht 71.0 in | Wt 265.0 lb

## 2018-08-09 DIAGNOSIS — M1611 Unilateral primary osteoarthritis, right hip: Secondary | ICD-10-CM

## 2018-08-09 DIAGNOSIS — M25551 Pain in right hip: Secondary | ICD-10-CM

## 2018-08-09 NOTE — Patient Instructions (Addendum)
Thank you for coming in today.  Recheck as needed.   Reasonable to schedule  With Dr Riki Altes with me as needed.   Call or go to the ER if you develop a large red swollen joint with extreme pain or oozing puss.    Total Hip Replacement  Total hip replacement is a surgery to remove damaged bone in your hip joint and replace it with an artificial (prosthetic) hip joint. The hip is a ball-and-socket type of joint. It has two main parts. The ball part of the joint (femoral head) is the top of the thighbone (femur). The socket part of the joint is a large, hollow area on the outer side of your pelvis (acetabulum) where the femur and pelvis meet. During total hip replacement, one or both parts of the hip joint are replaced, depending on the type of joint damage that you have. The purpose of this surgery is to reduce pain and improve your hip function. Tell a health care provider about:  Any allergies you have.  All medicines you are taking, including vitamins, herbs, eye drops, creams, and over-the-counter medicines.  Any problems you or family members have had with anesthetic medicines.  Any blood disorders you have.  Any surgeries you have had.  Any medical conditions you have.  Whether you are pregnant or may be pregnant. What are the risks? Generally, this is a safe procedure. However, problems may occur, including:  Infection.  Bleeding.  Allergic reactions to medicines.  Damage to nerves or other structures.  Dislocation of the prosthetic joint (prosthesis).  Loosening of the prosthesis.  Fracture of the bone.  A blood clot, which can break loose and travel to your lungs (pulmonary embolus).  Compartment syndrome.  Deep vein thrombosis. What happens before the procedure? Staying hydrated Follow instructions from your health care provider about hydration, which may include:  Up to 2 hours before the procedure - you may continue to drink clear liquids, such  as water, clear fruit juice, black coffee, and plain tea. Eating and drinking restrictions  Follow instructions from your health care provider about eating and drinking, which may include: ? 8 hours before the procedure - stop eating heavy meals or foods such as meat, fried foods, or fatty foods. ? 6 hours before the procedure - stop eating light meals or foods, such as toast or cereal. ? 6 hours before the procedure - stop drinking milk or drinks that contain milk. ? 2 hours before the procedure - stop drinking clear liquids. Medicines  Ask your health care provider about: ? Changing or stopping your regular medicines. This is especially important if you are taking diabetes medicines or blood thinners. ? Taking medicines such as aspirin and ibuprofen. These medicines can thin your blood. Do not take these medicines unless your health care provider tells you to take them. ? Taking over-the-counter medicines, vitamins, herbs, or supplements. General instructions  You may have a physical exam.  You may have testing, such as: ? X-rays or an MRI. ? Blood or urine tests.  Plan to have someone take you home from the hospital or clinic.  Plan to have a responsible adult care for you for at least 24 hours after you leave the hospital or clinic. This is important.  Prepare your home so you can be safe and have easy access to what you need.  To avoid the risk of infection, have routine teeth cleanings or any dental work done well in advance of  your surgery or wait until several weeks after your surgery.  Avoid shaving your legs just before surgery. If any shaving is needed, it will be done in the hospital.  Ask your health care provider how your surgical site will be marked or identified. What happens during the procedure?  To lower your risk of infection: ? Your health care team will wash or sanitize their hands. ? Hair may be removed from the surgical area. ? Your skin will be washed  with soap.  An IV will be inserted into one of your veins.  You will be given one or more of the following: ? A medicine to help you relax (sedative). ? A medicine to make you fall asleep (general anesthetic). ? A medicine that is injected into your spine to numb the area below and slightly above the injection site (spinal anesthetic).  An incision will be made so the surgeon can see the bones and tissue in your hip. The location of the incision will depend on the approach used by the surgeon: ? Posterior approach. The incision will be at the back of the hip. ? Anterior approach. The incision will be at the front of the hip.  Then, your surgeon will: ? Use his or her hands to move your hip out of position (dislocate it). ? Cut and remove damaged pieces of bone and cartilage. ? Insert a prosthetic ball and socket into the hip joint. ? Secure the ball and socket in the hip joint. ? Do an X-ray of the hip joint to confirm proper placement. ? Place a drain to remove excess fluid, if needed. ? Close the incision and apply a bandage (dressing) over the surgical site. The procedure may vary among health care providers and hospitals. What happens after the procedure?  Your blood pressure, heart rate, breathing rate, and blood oxygen level will be monitored until the medicines you were given have worn off.  Your neurovascular status will be monitored.  You will be given pain medicine.  You may have to wear compression stockings. These help to prevent blood clots and reduce swelling in your legs. You may also be given a blood-thinning (anticoagulant) medicine.  You will receive physical therapy until your health care provider feels it is safe for you to go home. You may need to use a walker or crutches.  If you had the posterior approach, you may have to use a wedge (hip abduction) pillow when you are in bed. ? This pillow will protect your hip from dislocation by keeping it straight and  will prevent your legs from turning inward or away from your body. Summary  Total hip replacement is a surgery to remove damaged bone in your hip joint and replace it with an artificial (prosthetic) hip joint.  Before the procedure, follow instructions from your health care provider about eating and drinking.  Plan to have someone take you home from the hospital or clinic.  After the surgery, you may have to wear compression stockings. These help to prevent blood clots and reduce swelling in your legs. This information is not intended to replace advice given to you by your health care provider. Make sure you discuss any questions you have with your health care provider. Document Released: 09/21/2000 Document Revised: 03/10/2017 Document Reviewed: 03/10/2017 Elsevier Interactive Patient Education  [employer].

## 2018-08-09 NOTE — Progress Notes (Signed)
Subjective:    I'm seeing this patient as a consultation for:  Jackson Marry, MD   CC: Right hip pain  HPI: Jackson Sherman has been experiencing pain in the right anterior and lateral hip off and on over the last year.  His pain is slowly worsened.  He was seen by his PCP on February 6 as part of a wellness exam.  During that visit he noted a year of right hip pain.  X-ray was obtained which showed degenerative changes he was furred to me for initial evaluation.   He notes pain located primarily in the anterior aspect of the hip.  Pain is worse with initial standing and walking.  The pain does improve a bit when she is been standing and walking for a few minutes.  Additionally he has pain with hip flexion and rotation.  He notes pain with forward hip flexion positioning such as putting on his right shoe and sock which he finds challenging.  The pain is interfering with his ability to exercise normally as well as to find some noxious.  He is tried some over-the-counter medications for pain which is helped a bit.  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  Past medical history, Surgical history, Family history not pertinant except as noted below, Social history, Allergies, and medications have been entered into the medical record, reviewed, and no changes needed.   Review of Systems: No headache, visual changes, nausea, vomiting, diarrhea, constipation, dizziness, abdominal pain, skin rash, fevers, chills, night sweats, weight loss, swollen lymph nodes, body aches, joint swelling, muscle aches, chest pain, shortness of breath, mood changes, visual or auditory hallucinations.   Objective:    Vitals:   08/09/18 1034  BP: 127/86  Pulse: 72   General: Well Developed, well nourished, and in no acute distress.  Neuro/Psych: Alert and oriented x3, extra-ocular muscles intact, able to move all 4 extremities, sensation grossly intact. Skin: Warm and dry, no rashes noted.    Respiratory: Not using accessory muscles, speaking in full sentences, trachea midline.  Cardiovascular: Pulses palpable, no extremity edema. Abdomen: Does not appear distended. MSK: Right hip normal-appearing Range of motion limited flexion to 90 degrees.  Limited internal and external rotation with pain. Hip abduction strength is intact 5/5 within limited hip abduction range of motion.  Left hip normal-appearing nontender normal motion normal strength.  Lab and Radiology Results EXAM: DG HIP (WITH OR WITHOUT PELVIS) 2-3V RIGHT  COMPARISON:  None.  FINDINGS: No fracture.  No bone lesion.  There is moderate to severe narrowing of the superolateral right hip joint space with mild superior acetabular subchondral sclerosis. Small marginal osteophytes are noted along the base of the right femoral head.  Left hip joint, SI joints and symphysis pubis are normally spaced and aligned.  Soft tissues are unremarkable.  IMPRESSION: 1. No fracture, bone lesion or acute finding. 2. Arthropathic changes of the right hip joint. These changes are most consistent with moderate to severe osteoarthritis.   Electronically Signed   By: Lajean Manes M.D.   On: 08/04/2018 15:33 I personally (independently) visualized and performed the interpretation of the images attached in this note.   Procedure: Real-time Ultrasound Guided Injection of right femoral acetabular joint Device: GE Logiq E   Images permanently stored and available for review in the ultrasound unit. Verbal informed consent obtained.  Discussed risks and benefits of procedure. Warned about infection bleeding damage to structures skin hypopigmentation and fat atrophy  among others. Patient expresses understanding and agreement Time-out conducted.   Noted no overlying erythema, induration, or other signs of local infection.   Skin prepped in a sterile fashion.   Local anesthesia: Topical Ethyl chloride.   With sterile  technique and under real time ultrasound guidance:  80 mg of Depo-Medrol and 4 mL of Marcaine injected easily.   Completed without difficulty   Pain immediately resolved suggesting accurate placement of the medication.   Advised to call if fevers/chills, erythema, induration, drainage, or persistent bleeding.   Images permanently stored and available for review in the ultrasound unit.  Impression: Technically successful ultrasound guided injection.        Impression and Recommendations:    Assessment and Plan: 58 y.o. male with right hip pain very likely due to a significant DJD seen on x-ray.  Patient had immediate pain benefit from intra-articular injection today in clinic.  At this point I am hopeful he will have some benefit from steroid injection but I am not optimistic about prolonged benefit.  I think it is reasonable to follow back up with his orthopedic surgeon for evaluation of total hip replacement.  Recheck with me as needed.Marland Kitchen  PDMP not reviewed this encounter. Orders Placed This Encounter  Procedures  . Ambulatory referral to Orthopedic Surgery    Referral Priority:   Routine    Referral Type:   Surgical    Referral Reason:   Specialty Services Required    Referred to Provider:   Vickey Huger, MD    Requested Specialty:   Orthopedic Surgery    Number of Visits Requested:   1   No orders of the defined types were placed in this encounter.   Discussed warning signs or symptoms. Please see discharge instructions. Patient expresses understanding.

## 2018-08-11 ENCOUNTER — Encounter: Payer: Self-pay | Admitting: Family Medicine

## 2018-08-11 ENCOUNTER — Telehealth: Payer: Self-pay | Admitting: Family Medicine

## 2018-08-11 DIAGNOSIS — M25551 Pain in right hip: Secondary | ICD-10-CM

## 2018-08-11 DIAGNOSIS — M1611 Unilateral primary osteoarthritis, right hip: Secondary | ICD-10-CM

## 2018-08-11 NOTE — Telephone Encounter (Signed)
I sent referral to [facility]. - [facility]

## 2018-08-11 NOTE — Telephone Encounter (Signed)
Sent referral to Dr. Berenice Primas - [facility]

## 2018-08-11 NOTE — Telephone Encounter (Signed)
Switch to Dr. Berenice Primas

## 2018-08-11 NOTE — Telephone Encounter (Signed)
I received a phone call stating that Dr. Lorre Nick no longer does total hip replacements.  He only does knees and shoulders.  I will refer to [facility] which is very close to Dr. Jeoffrey Massed office.  I did my fellowship there and they do a great job and will treat you right.  If you do some research and would like to go elsewhere please let me know.

## 2018-08-12 NOTE — Telephone Encounter (Signed)
Patient has been advised. Kathrene Sinopoli,CMA  

## 2018-08-23 ENCOUNTER — Encounter (INDEPENDENT_AMBULATORY_CARE_PROVIDER_SITE_OTHER): Payer: Self-pay

## 2018-08-30 ENCOUNTER — Ambulatory Visit (INDEPENDENT_AMBULATORY_CARE_PROVIDER_SITE_OTHER): Payer: No Typology Code available for payment source | Admitting: Bariatrics

## 2018-08-30 ENCOUNTER — Encounter (INDEPENDENT_AMBULATORY_CARE_PROVIDER_SITE_OTHER): Payer: Self-pay | Admitting: Bariatrics

## 2018-08-30 VITALS — BP 120/77 | HR 64 | Temp 97.6°F | Ht 69.0 in | Wt 257.0 lb

## 2018-08-30 DIAGNOSIS — Z1331 Encounter for screening for depression: Secondary | ICD-10-CM | POA: Diagnosis not present

## 2018-08-30 DIAGNOSIS — G8929 Other chronic pain: Secondary | ICD-10-CM

## 2018-08-30 DIAGNOSIS — Z0289 Encounter for other administrative examinations: Secondary | ICD-10-CM

## 2018-08-30 DIAGNOSIS — Z87898 Personal history of other specified conditions: Secondary | ICD-10-CM

## 2018-08-30 DIAGNOSIS — E559 Vitamin D deficiency, unspecified: Secondary | ICD-10-CM

## 2018-08-30 DIAGNOSIS — I10 Essential (primary) hypertension: Secondary | ICD-10-CM

## 2018-08-30 DIAGNOSIS — Z6838 Body mass index (BMI) 38.0-38.9, adult: Secondary | ICD-10-CM

## 2018-08-30 DIAGNOSIS — R0602 Shortness of breath: Secondary | ICD-10-CM | POA: Diagnosis not present

## 2018-08-30 DIAGNOSIS — E7849 Other hyperlipidemia: Secondary | ICD-10-CM

## 2018-08-30 DIAGNOSIS — R5383 Other fatigue: Secondary | ICD-10-CM

## 2018-08-30 DIAGNOSIS — Z9189 Other specified personal risk factors, not elsewhere classified: Secondary | ICD-10-CM | POA: Diagnosis not present

## 2018-08-30 DIAGNOSIS — R7303 Prediabetes: Secondary | ICD-10-CM

## 2018-08-30 DIAGNOSIS — M25511 Pain in right shoulder: Secondary | ICD-10-CM

## 2018-08-30 NOTE — Progress Notes (Signed)
Office: [phone]  /  Fax: [phone]   Dear Dr. Carollee Sherman and Dr. Rene Sherman. Jackson Sherman,   Thank you for referring Jackson Sherman to our clinic. The following note includes my evaluation and treatment recommendations.  HPI:   Chief Complaint: OBESITY    Jackson Sherman has been referred by Jackson Held, DO and Jackson Sherman. Jackson Fireman, MD for consultation regarding his obesity and obesity related comorbidities.    Jackson Sherman (MR# [id number]) is a 58 y.o. male who presents on 08/30/2018 for obesity evaluation and treatment. Current BMI is Body mass index is 37.95 kg/m.Marland Kitchen Jackson Sherman and has been unsuccessful in either losing weight, maintaining weight loss, or reaching his healthy weight goal.     Jackson Sherman attended our information session and states he is currently in the action stage of change and ready to dedicate time achieving and maintaining a healthier weight. Jackson Sherman is interested in becoming our patient and working on intensive lifestyle modifications including (but not limited to) diet, exercise and weight loss.    Jackson Sherman states his family eats meals together he struggles with family and or coworkers weight loss sabotage his desired weight loss is 77 lbs he has been heavy most of his life he started gaining weight when he "busted his knee" his heaviest weight ever was 260 lbs. he eats outside the home 4 to 5 times a week he denies food dislikes he has significant food cravings issues  he snacks frequently at night he frequently makes poor food choices he frequently eats larger portions than normal  he has binge eating behaviors he struggles with emotional eating  he considers his worst habit to be sweets   Fatigue Jackson Sherman feels more fatigued. This has worsened with weight gain. Jackson Sherman admits to daytime somnolence and he denies waking up still tired. Patient is at risk for obstructive sleep apnea. Patent  has a history of symptoms of daytime fatigue and hypertension. Patient generally gets 7 or 8 hours of sleep per night, and states they generally restful sleep. Snoring is present. Apneic episodes are not present. Epworth Sleepiness Score is 10  Dyspnea on exertion Jackson Sherman notes increasing shortness of breath with certain activities (increased steps) and seems to be worsening over time with weight gain. He notes getting out of breath sooner with activity than he used to. This has not gotten worse recently. Benoit denies orthopnea.  Hypertension Jackson Sherman is a 58 y.o. male with hypertension. He is currently taking Cozaar. Jackson Sherman denies lightheadedness. He is working weight loss to help control his blood pressure with the goal of decreasing his risk of heart attack and stroke. Jackson Sherman blood pressure is well controlled.  Hyperlipidemia Jackson Sherman has hyperlipidemia and he is currently taking Lipitor. He has been trying to improve his cholesterol levels with intensive lifestyle modification including a low saturated fat diet, exercise and weight loss. He denies myalgias. See last labs 02/15/18.  Chronic Left Shoulder Pain Jackson Sherman has chronic left shoulder pain and he has some restriction in movement.  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 He denies alcohol at this time.  Pre-Diabetes Jackson Sherman has a diagnosis of prediabetes based on his elevated Hgb A1c and was informed this puts him at greater risk of developing diabetes. His last A1c was at 5.8 (08/04/18). He is not taking medications currently and continues to work on diet and exercise to  decrease risk of diabetes. He admits to mild polyphagia at certain times for sweets.  At risk for diabetes Jackson Sherman is at higher than average risk for developing diabetes due to his obesity and prediabetes. He currently denies polyuria or polydipsia.  Vitamin D  deficiency Jackson Sherman has a diagnosis of vitamin D deficiency. He is not currently taking vit D and denies nausea, vomiting or muscle weakness.  Depression Screen Jackson Sherman's Food and Mood (modified PHQ-9) score was  Depression screen PHQ 2/9 08/30/2018  Decreased Interest 2  Down, Depressed, Hopeless 1  PHQ - 2 Score 3  Altered sleeping 2  Tired, decreased energy 3  Change in appetite 1  Feeling bad or failure about yourself  2  Trouble concentrating 1  Moving slowly or fidgety/restless 0  Suicidal thoughts 0  PHQ-9 Score 12  Difficult doing work/chores Not difficult at all    ASSESSMENT AND PLAN:  Other fatigue - Plan: EKG 12-Lead, T3, T4, free, TSH  Shortness of breath on exertion  Essential hypertension  Other hyperlipidemia - Plan: Lipid Panel With LDL/HDL Ratio  Chronic right shoulder pain  Prediabetes - Plan: Comprehensive metabolic panel, Insulin, random  History of seizure  Vitamin D deficiency - Plan: VITAMIN D 25 Hydroxy (Vit-D Deficiency, Fractures)  Depression screening  At risk for diabetes mellitus  Class 2 severe obesity with serious comorbidity and body mass index (BMI) of 38.0 to 38.9 in adult, unspecified obesity type (HCC)  PLAN:  Fatigue Doni was informed that his fatigue may be related to obesity, depression or many other causes. Labs will be ordered, and in the meanwhile Kenner has agreed to work on diet, exercise and weight loss to help with fatigue. Proper sleep hygiene was discussed including the need for 7-8 hours of quality sleep each night. A sleep study was not ordered based on symptoms and Epworth score.  Dyspnea on exertion Jun's shortness of breath appears to be obesity related and exercise induced. He has agreed to work on weight loss and gradually increase exercise to treat his exercise induced shortness of breath. If Ayrton follows our instructions and loses weight without improvement of his shortness of breath, we will plan to  refer to pulmonology. We will monitor this condition regularly. Herschel agrees to this plan.  Hypertension We discussed sodium restriction, working on healthy weight loss, and a regular exercise program as the means to achieve improved blood pressure control. Oumar agreed with this plan and agreed to follow up as directed. We will continue to monitor his blood pressure as well as his progress with the above lifestyle modifications. He will continue his medications as prescribed and will watch for signs of hypotension as he continues his lifestyle modifications.  Hyperlipidemia Siddiq was informed of the American Heart Association Guidelines emphasizing intensive lifestyle modifications as the first line treatment for hyperlipidemia. We discussed many lifestyle modifications today in depth, and Kaidan will start to work on decreasing saturated fats such as fatty red meat, butter and many fried foods. He will also increase vegetables and lean protein in his diet and start to work on exercise and weight loss efforts. Bing will continue his medications as prescribed and follow up as directed.  Chronic Left Shoulder Pain Kenry will follow up with the orthopedist if needed. He will follow up with our clinic in 2 weeks.  History of Seizure Phuc will continue to go to meeting for Alcoholics Anonymous.  Pre-Diabetes Tamara will continue to work on weight loss, exercise, and decreasing simple carbohydrates  in his diet to help decrease the risk of diabetes. He was informed that eating too many simple carbohydrates or too many calories at one sitting increases the likelihood of GI side effects. We will check insulin level today and Markevious agreed to follow up with [place] as directed to monitor his progress.  Diabetes risk counseling Kuper was given extended (15 minutes) diabetes prevention counseling today. He is 58 y.o. male and has risk factors for diabetes including obesity and prediabetes. We  discussed intensive lifestyle modifications today with an emphasis on weight loss as well as increasing exercise and decreasing simple carbohydrates in his diet.  Vitamin D Deficiency Thelton was informed that low vitamin D levels contributes to fatigue and are associated with obesity, breast, and colon cancer. We will check vitamin D level today and he will follow up for routine testing of vitamin D, at least 2-3 times per year.   Depression Screen Dwon had a moderately positive depression screening. Depression is commonly associated with obesity and often results in emotional eating behaviors. We will monitor this closely and work on CBT to help improve the non-hunger eating patterns. Referral to Psychology may be required if no improvement is seen as he continues in our clinic.  Obesity Artyom is currently in the action stage of change and his goal is to continue with weight loss efforts. I recommend Randon begin the structured treatment plan as follows:  He has agreed to follow the Category 3 plan Riordan has been instructed to eventually work up to a goal of 150 minutes of combined cardio and strengthening exercise per week for weight loss and overall health benefits. We discussed the following Behavioral Modification Strategies today: increase H2O intake, no skipping meals, keeping healthy foods in the home, increasing lean protein intake, decreasing simple carbohydrates, increasing vegetables, decrease eating out and work on meal planning and easy cooking plans   He was informed of the importance of frequent follow up visits to maximize his success with intensive lifestyle modifications for his multiple health conditions. He was informed we would discuss his lab results at his next visit unless there is a critical issue that needs to be addressed sooner. Greig agreed to keep his next visit at the agreed upon time to discuss these results.  ALLERGIES: No Known  Allergies  MEDICATIONS: Current Outpatient Medications on File Prior to Visit  Medication Sig Dispense Refill   atorvastatin (LIPITOR) 20 MG tablet Take 20 mg by mouth at bedtime 90 tablet 3   diphenhydrAMINE (BENADRYL) 25 MG tablet Take 25 mg by mouth at bedtime as needed for sleep.      escitalopram (LEXAPRO) 20 MG tablet Take 1 tablet (20 mg total) by mouth daily. 90 tablet 1   gabapentin (NEURONTIN) 300 MG capsule Take 1 capsule (300 mg total) by mouth 3 (three) times daily. 1 cap PO QHS x 4 days, the increase to BID. 90 capsule 5   losartan (COZAAR) 50 MG tablet Take 1 tablet (50 mg total) by mouth daily. 90 tablet 1   omeprazole (PRILOSEC OTC) 20 MG tablet Take 20 mg by mouth daily before breakfast.     traZODone (DESYREL) 100 MG tablet Take 1 tablet (100 mg total) by mouth at bedtime. 90 tablet 1   loratadine (CLARITIN) 10 MG tablet Take 10 mg by mouth daily.     naproxen sodium (ALEVE) 220 MG tablet Take 220-440 mg by mouth 2 (two) times daily as needed (for pain).     sildenafil (REVATIO)  20 MG tablet Take 2-5 tablets (40-100 mg total) by mouth daily as needed. (Patient not taking: Reported on 08/30/2018) 50 tablet 4   No current facility-administered medications on file prior to visit.     PAST MEDICAL HISTORY: Past Medical History:  Diagnosis Date   Alcohol rehabilitation 03-2005, 03-2007   Anxiety    Depression    GERD (gastroesophageal reflux disease)    Hemorrhoids    Hip arthritis    Hyperlipidemia    Hypertension    Joint pain    Osteoarthritis    Seizures ([place])    ?? 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   Procedure: LEFT TOTAL KNEE ARTHROPLASTY;  Surgeon: Vickey Huger, MD;  Location: [facility];  Service: Orthopedics;  Laterality: Left;    SOCIAL HISTORY: Social History   Tobacco Use   Smoking status: Former Smoker    Packs/day: 1.00    Sherman: 15.00    Pack Sherman: 15.00    Types: Cigarettes    Last attempt to quit: 06/30/2003    Sherman since quitting: 15.1   Smokeless tobacco: Never Used  Substance Use Topics   Alcohol use: Yes    Comment: in current relapse   Drug use: No    FAMILY HISTORY: Family History  Problem Relation Age of Onset   Heart disease Father    Heart disease Maternal Grandmother 34   Stroke Maternal Grandmother    Diabetes Maternal Grandmother    Hyperlipidemia Mother    Hypertension Mother    Breast cancer Mother 63   Depression Sister    Diabetes Sister    Diabetes Brother    Diabetes Paternal Grandmother    Stroke Paternal Grandmother     ROS: Review of Systems  Constitutional: Positive for malaise/fatigue.  HENT: Positive for congestion (nasal stuffiness) and sinus pain.   Respiratory: Positive for shortness of breath (with activity).   Cardiovascular: Negative for orthopnea.  Gastrointestinal: Negative for nausea and vomiting.  Genitourinary: Negative for frequency.  Musculoskeletal:       + Neck Lumps + Muscle or Joint Pain + shoulder pain (left)  Neurological: Positive for headaches.       Negative for lightheadedness  Endo/Heme/Allergies: Negative for polydipsia.       + polyphagia  Psychiatric/Behavioral: Positive for depression.    PHYSICAL EXAM: Blood pressure 120/77, pulse 64, temperature 97.6 F (36.4 C), temperature source Oral, height 5\' 9"  (1.753 m), weight 257 lb (116.6 kg), SpO2 96 %. Body mass index is 37.95 kg/m. Physical Exam Vitals signs reviewed.  Constitutional:      Appearance: Normal appearance. He is well-developed.  HENT:     Head: Normocephalic  and atraumatic.     Nose: Nose normal.     Mouth/Throat:     Comments: Mallampati = 4 Eyes:     General: No scleral icterus.    Extraocular Movements:     Right eye: Normal extraocular motion.     Left eye: Normal extraocular motion.  Neck:     Musculoskeletal: Normal range  of motion and neck supple.     Thyroid: No thyromegaly.  Cardiovascular:     Rate and Rhythm: Normal rate and regular rhythm.  Pulmonary:     Effort: Pulmonary effort is normal. No respiratory distress.  Abdominal:     Palpations: Abdomen is soft.     Tenderness: There is no abdominal tenderness.  Musculoskeletal: Normal range of motion.     Comments: Range of Motion normal in all 4 extremities  Skin:    General: Skin is warm and dry.  Neurological:     Mental Status: He is alert and oriented to person, place, and time.  Psychiatric:        Mood and Affect: Mood normal.        Behavior: Behavior normal.     RECENT LABS AND TESTS: BMET    Component Value Date/Time   NA 140 08/04/2018 1044   K 4.6 08/04/2018 1044   CL 104 08/04/2018 1044   CO2 28 08/04/2018 1044   GLUCOSE 86 08/04/2018 1044   BUN 18 08/04/2018 1044   CREATININE 1.05 08/04/2018 1044   CALCIUM 8.8 08/04/2018 1044   GFRNONAA 78 08/04/2018 1044   GFRAA 90 08/04/2018 1044   Lab Results  Component Value Date   HGBA1C 5.8 (H) 08/04/2018   No results found for: INSULIN CBC    Component Value Date/Time   WBC 12.2 (H) 02/11/2017 1435   RBC 5.18 02/11/2017 1435   HGB 16.0 02/11/2017 1441   HCT 47.0 02/11/2017 1441   PLT 293 02/11/2017 1435   MCV 86.5 02/11/2017 1435   MCH 29.5 02/11/2017 1435   MCHC 34.2 02/11/2017 1435   RDW 14.7 02/11/2017 1435   LYMPHSABS 5.6 (H) 02/11/2017 1435   MONOABS 0.7 02/11/2017 1435   EOSABS 0.2 02/11/2017 1435   BASOSABS 0.1 02/11/2017 1435   Iron/TIBC/Ferritin/ %Sat No results found for: IRON, TIBC, FERRITIN, IRONPCTSAT Lipid Panel     Component Value Date/Time   CHOL 214 (H) 02/15/2018  1149   TRIG 110 02/15/2018 1149   HDL 40 (L) 02/15/2018 1149   CHOLHDL 5.4 (H) 02/15/2018 1149   VLDL 25 09/24/2015 1049   LDLCALC 151 (H) 02/15/2018 1149   Hepatic Function Panel     Component Value Date/Time   PROT 7.4 02/15/2018 1149   ALBUMIN 3.9 02/11/2017 1435   AST 19 02/15/2018 1149   ALT 23 02/15/2018 1149   ALKPHOS 75 02/11/2017 1435   BILITOT 0.5 02/15/2018 1149   No results found for: TSH  ECG  shows NSR with a rate of 68 BPM INDIRECT CALORIMETER done today shows a VO2 of 281 and a REE of 1956.  His calculated basal metabolic rate is 1607 thus his basal metabolic rate is worse than expected.   OBESITY BEHAVIORAL INTERVENTION VISIT  Today's visit was # 1   Starting weight: 257 lbs Starting date: 08/30/2018 Today's weight : 257 lbs  Today's date: 08/30/2018 Total lbs lost to date: 0    08/30/2018  Height 5\' 9"  (1.753 m)  Weight 257 lb (116.6 kg)  BMI (Calculated) 37.93  BLOOD PRESSURE - SYSTOLIC 371  BLOOD PRESSURE - DIASTOLIC 77  Waist Measurement  52 inches   Body Fat % 37 %  Total Body Water (lbs) 114.8 lbs  RMR 1956    ASK: We discussed the diagnosis of obesity with Jackson Sherman today and Marquese agreed to give [place] permission to discuss obesity behavioral modification therapy today.  ASSESS: Kiyaan has the diagnosis  of obesity and his BMI today is 37.93 Cesar is in the action stage of change   ADVISE: Billyjoe was educated on the multiple health risks of obesity as well as the benefit of weight loss to improve his health. He was advised of the need for long term treatment and the importance of lifestyle modifications to improve his current health and to decrease his risk of future health problems.  AGREE: Multiple dietary modification options and treatment options were discussed and  Dakarri agreed to follow the recommendations documented in the above note.  ARRANGE: Shey was educated on the importance of frequent visits to treat obesity as  outlined per CMS and USPSTF guidelines and agreed to schedule his next follow up appointment today.  Corey Skains, am acting as Location manager for [employer]. Owens Shark, DO

## 2018-08-31 DIAGNOSIS — R7303 Prediabetes: Secondary | ICD-10-CM | POA: Insufficient documentation

## 2018-08-31 DIAGNOSIS — Z87898 Personal history of other specified conditions: Secondary | ICD-10-CM | POA: Insufficient documentation

## 2018-08-31 LAB — COMPREHENSIVE METABOLIC PANEL
A/G RATIO: 1.4 (ref 1.2–2.2)
ALT: 20 IU/L (ref 0–44)
AST: 19 IU/L (ref 0–40)
Albumin: 4.2 g/dL (ref 3.8–4.9)
Alkaline Phosphatase: 100 IU/L (ref 39–117)
BUN/Creatinine Ratio: 19 (ref 9–20)
BUN: 19 mg/dL (ref 6–24)
Bilirubin Total: 0.4 mg/dL (ref 0.0–1.2)
CO2: 23 mmol/L (ref 20–29)
Calcium: 9 mg/dL (ref 8.7–10.2)
Chloride: 100 mmol/L (ref 96–106)
Creatinine, Ser: 0.99 mg/dL (ref 0.76–1.27)
GFR calc Af Amer: 97 mL/min/{1.73_m2} (ref >59)
GFR calc non Af Amer: 84 mL/min/{1.73_m2} (ref >59)
Globulin, Total: 2.9 g/dL (ref 1.5–4.5)
Glucose: 94 mg/dL (ref 65–99)
Potassium: 4.6 mmol/L (ref 3.5–5.2)
Sodium: 140 mmol/L (ref 134–144)
Total Protein: 7.1 g/dL (ref 6.0–8.5)

## 2018-08-31 LAB — LIPID PANEL WITH LDL/HDL RATIO
Cholesterol, Total: 168 mg/dL (ref 100–199)
HDL: 42 mg/dL (ref >39)
LDL Calculated: 105 mg/dL — ABNORMAL HIGH (ref 0–99)
LDl/HDL Ratio: 2.5 ratio (ref 0.0–3.6)
Triglycerides: 104 mg/dL (ref 0–149)
VLDL Cholesterol Cal: 21 mg/dL (ref 5–40)

## 2018-08-31 LAB — VITAMIN D 25 HYDROXY (VIT D DEFICIENCY, FRACTURES): Vit D, 25-Hydroxy: 28.3 ng/mL — ABNORMAL LOW (ref 30.0–100.0)

## 2018-08-31 LAB — T3: T3, Total: 154 ng/dL (ref 71–180)

## 2018-08-31 LAB — TSH: TSH: 1.43 u[IU]/mL (ref 0.450–4.500)

## 2018-08-31 LAB — T4, FREE: Free T4: 1.26 ng/dL (ref 0.82–1.77)

## 2018-08-31 LAB — INSULIN, RANDOM: INSULIN: 15.9 u[IU]/mL (ref 2.6–24.9)

## 2018-09-01 ENCOUNTER — Encounter (INDEPENDENT_AMBULATORY_CARE_PROVIDER_SITE_OTHER): Payer: Self-pay | Admitting: Bariatrics

## 2018-09-13 ENCOUNTER — Encounter (INDEPENDENT_AMBULATORY_CARE_PROVIDER_SITE_OTHER): Payer: Self-pay | Admitting: Bariatrics

## 2018-09-13 ENCOUNTER — Other Ambulatory Visit: Payer: Self-pay

## 2018-09-13 ENCOUNTER — Ambulatory Visit (INDEPENDENT_AMBULATORY_CARE_PROVIDER_SITE_OTHER): Payer: No Typology Code available for payment source | Admitting: Bariatrics

## 2018-09-13 VITALS — BP 129/91 | HR 80 | Temp 98.5°F | Ht 69.0 in | Wt 251.0 lb

## 2018-09-13 DIAGNOSIS — E559 Vitamin D deficiency, unspecified: Secondary | ICD-10-CM

## 2018-09-13 DIAGNOSIS — I10 Essential (primary) hypertension: Secondary | ICD-10-CM

## 2018-09-13 DIAGNOSIS — Z6837 Body mass index (BMI) 37.0-37.9, adult: Secondary | ICD-10-CM

## 2018-09-13 DIAGNOSIS — R7303 Prediabetes: Secondary | ICD-10-CM | POA: Diagnosis not present

## 2018-09-13 DIAGNOSIS — Z9189 Other specified personal risk factors, not elsewhere classified: Secondary | ICD-10-CM

## 2018-09-13 MED ORDER — VITAMIN D (ERGOCALCIFEROL) 1.25 MG (50000 UNIT) PO CAPS: 50000.0000 [IU] | ORAL_CAPSULE | ORAL | 0 refills | Status: DC

## 2018-09-13 MED FILL — VIT D2 1.25 MG (50,000 UNIT: 1.25 MG | 28 days supply | Qty: 4 | Fill #0

## 2018-09-13 NOTE — Progress Notes (Signed)
Office: [phone]  /  Fax: [phone]   HPI:   Chief Complaint: OBESITY Jackson Sherman is here to discuss his progress with his obesity treatment plan. He is on the Category 3 plan and is following his eating plan approximately 100 % of the time. He states he is exercising 0 minutes 0 times per week. Jackson Sherman states that there is plenty of meat. He likes the [employer]. He is eating breakfast. His weight is 251 lb (113.9 kg) today and has had a weight loss of 6 pounds over a period of 2 weeks since his last visit. He has lost 6 lbs since starting treatment with [place].  Pre-Diabetes Jackson Sherman has a diagnosis of prediabetes based on his elevated Hgb A1c and was informed this puts him at greater risk of developing diabetes. His last A1c was at 5.8 and last insulin level was at 15.9 He is not taking metformin currently and continues to work on diet and exercise to decrease risk of diabetes. He denies nausea or hypoglycemia.  Hypertension Jackson Sherman is a 58 y.o. male with hypertension. He is taking Losartan. Jackson Sherman denies chest pain or shortness of breath on exertion. He is working weight loss to help control his blood pressure with the goal of decreasing his risk of heart attack and stroke. Jackson Sherman blood pressure is not well controlled.  Vitamin D deficiency Jackson Sherman has a diagnosis of vitamin D deficiency. He is not currently taking vit D and denies nausea, vomiting or muscle weakness.  At risk for osteopenia and osteoporosis Jackson Sherman is at higher risk of osteopenia and osteoporosis due to vitamin D deficiency.   ASSESSMENT AND PLAN:  Prediabetes  Essential hypertension  Vitamin D deficiency - Plan: Vitamin D, Ergocalciferol, (DRISDOL) 1.25 MG (50000 UT) CAPS capsule  At risk for osteoporosis  Class 2 severe obesity with serious comorbidity and body mass index (BMI) of 37.0 to 37.9 in adult, unspecified obesity type [facility])  PLAN:  Pre-Diabetes Jackson Sherman will continue  to work on weight loss, exercise, and decreasing simple carbohydrates in his diet to help decrease the risk of diabetes. He was informed that eating too many simple carbohydrates or too many calories at one sitting increases the likelihood of GI side effects. Jackson Sherman agreed to follow up with [place] as directed to monitor his progress.  Hypertension We discussed sodium restriction, working on healthy weight loss, and a regular exercise program as the means to achieve improved blood pressure control. Jackson Sherman agreed with this plan and agreed to follow up as directed. We will continue to monitor his blood pressure as well as his progress with the above lifestyle modifications. He will continue his medications as prescribed and will watch for signs of hypotension as he continues his lifestyle modifications.  Vitamin D Deficiency Jackson Sherman was informed that low vitamin D levels contributes to fatigue and are associated with obesity, breast, and colon cancer. He agrees to start prescription Vit D @50 ,000 IU every week #4 with no refills and will follow up for routine testing of vitamin D, at least 2-3 times per year. He was informed of the risk of over-replacement of vitamin D and agrees to not increase his dose unless he discusses this with [place] first. Jackson Sherman agrees to follow up as directed.  At risk for osteopenia and osteoporosis Jackson Sherman was given extended  (15 minutes) osteoporosis prevention counseling today. Jackson Sherman is at risk for osteopenia and osteoporosis due to his vitamin D deficiency. He was encouraged to take his vitamin  D and follow his higher calcium diet and increase strengthening exercise to help strengthen his bones and decrease his risk of osteopenia and osteoporosis.  Obesity Jackson Sherman is currently in the action stage of change. As such, his goal is to continue with weight loss efforts He has agreed to follow the Category 3 plan Jackson Sherman has been instructed to work up to a goal of 150 minutes of  combined cardio and strengthening exercise per week for weight loss and overall health benefits. We discussed the following Behavioral Modification Strategies today: increase H2O intake, no skipping meals, keeping healthy foods in the home,increasing lean protein intake, decreasing simple carbohydrates, increasing vegetables, decrease eating out and work on meal planning and easy cooking plans [name] can eat 50 calories of sandwich thins or sandwich skinnys.  Jackson Sherman has agreed to follow up with our clinic in 2 weeks. He was informed of the importance of frequent follow up visits to maximize his success with intensive lifestyle modifications for his multiple health conditions.  ALLERGIES: No Known Allergies  MEDICATIONS: Current Outpatient Medications on File Prior to Visit  Medication Sig Dispense Refill   atorvastatin (LIPITOR) 20 MG tablet Take 20 mg by mouth at bedtime 90 tablet 3   diphenhydrAMINE (BENADRYL) 25 MG tablet Take 25 mg by mouth at bedtime as needed for sleep.      escitalopram (LEXAPRO) 20 MG tablet Take 1 tablet (20 mg total) by mouth daily. 90 tablet 1   gabapentin (NEURONTIN) 300 MG capsule Take 1 capsule (300 mg total) by mouth 3 (three) times daily. 1 cap PO QHS x 4 days, the increase to BID. 90 capsule 5   loratadine (CLARITIN) 10 MG tablet Take 10 mg by mouth daily.     losartan (COZAAR) 50 MG tablet Take 1 tablet (50 mg total) by mouth daily. 90 tablet 1   naproxen sodium (ALEVE) 220 MG tablet Take 220-440 mg by mouth 2 (two) times daily as needed (for pain).     omeprazole (PRILOSEC OTC) 20 MG tablet Take 20 mg by mouth daily before breakfast.     sildenafil (REVATIO) 20 MG tablet Take 2-5 tablets (40-100 mg total) by mouth daily as needed. 50 tablet 4   traZODone (DESYREL) 100 MG tablet Take 1 tablet (100 mg total) by mouth at bedtime. 90 tablet 1   No current facility-administered medications on file prior to visit.     PAST MEDICAL HISTORY: Past  Medical History:  Diagnosis Date   Alcohol rehabilitation 03-2005, 03-2007   Anxiety    Depression    GERD (gastroesophageal reflux disease)    Hemorrhoids    Hip arthritis    Hyperlipidemia    Hypertension    Joint pain    Osteoarthritis    Seizures ([place])    ?? 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   Procedure: LEFT TOTAL KNEE ARTHROPLASTY;  Surgeon: Vickey Huger, MD;  Location: [place];  Service: Orthopedics;  Laterality: Left;    SOCIAL HISTORY: Social History   Tobacco Use   Smoking status:  Former Smoker    Packs/day: 1.00    Years: 15.00    Pack years: 15.00    Types: Cigarettes    Last attempt to quit: 06/30/2003    Years since quitting: 15.2   Smokeless tobacco: Never Used  Substance Use Topics   Alcohol use: Yes    Comment: in current relapse   Drug use: No    FAMILY HISTORY: Family History  Problem Relation Age of Onset   Heart disease Father    Heart disease Maternal Grandmother 37   Stroke Maternal Grandmother    Diabetes Maternal Grandmother    Hyperlipidemia Mother    Hypertension Mother    Breast cancer Mother 32   Depression Sister    Diabetes Sister    Diabetes Brother    Diabetes Paternal Grandmother    Stroke Paternal Grandmother     ROS: Review of Systems  Constitutional: Positive for weight loss.  Cardiovascular: Negative for chest pain.  Gastrointestinal: Negative for nausea and vomiting.  Musculoskeletal:       Negative for muscle weakness  Endo/Heme/Allergies:       Negative for hypoglycemia    PHYSICAL EXAM: Blood  pressure (!) 129/91, pulse 80, temperature 98.5 F (36.9 C), temperature source Oral, height 5\' 9"  (1.753 m), weight 251 lb (113.9 kg), SpO2 96 %. Body mass index is 37.07 kg/m. Physical Exam Vitals signs reviewed.  Constitutional:      Appearance: Normal appearance. He is well-developed. He is obese.  Cardiovascular:     Rate and Rhythm: Normal rate.  Pulmonary:     Effort: Pulmonary effort is normal.  Musculoskeletal: Normal range of motion.  Skin:    General: Skin is warm and dry.  Neurological:     Mental Status: He is alert and oriented to person, place, and time.  Psychiatric:        Mood and Affect: Mood normal.        Behavior: Behavior normal.     RECENT LABS AND TESTS: BMET    Component Value Date/Time   NA 140 08/30/2018 0930   K 4.6 08/30/2018 0930   CL 100 08/30/2018 0930   CO2 23 08/30/2018 0930   GLUCOSE 94 08/30/2018 0930   GLUCOSE 86 08/04/2018 1044   BUN 19 08/30/2018 0930   CREATININE 0.99 08/30/2018 0930   CREATININE 1.05 08/04/2018 1044   CALCIUM 9.0 08/30/2018 0930   GFRNONAA 84 08/30/2018 0930   GFRNONAA 78 08/04/2018 1044   GFRAA 97 08/30/2018 0930   GFRAA 90 08/04/2018 1044   Lab Results  Component Value Date   HGBA1C 5.8 (H) 08/04/2018   Lab Results  Component Value Date   INSULIN 15.9 08/30/2018   CBC    Component Value Date/Time   WBC 12.2 (H) 02/11/2017 1435   RBC 5.18 02/11/2017 1435   HGB 16.0 02/11/2017 1441   HCT 47.0 02/11/2017 1441   PLT 293 02/11/2017 1435   MCV 86.5 02/11/2017 1435   MCH 29.5 02/11/2017 1435   MCHC 34.2 02/11/2017 1435   RDW 14.7 02/11/2017 1435   LYMPHSABS 5.6 (H) 02/11/2017 1435   MONOABS 0.7 02/11/2017 1435   EOSABS 0.2 02/11/2017 1435   BASOSABS 0.1 02/11/2017 1435   Iron/TIBC/Ferritin/ %Sat No results found for: IRON, TIBC, FERRITIN, IRONPCTSAT Lipid Panel     Component Value Date/Time   CHOL 168 08/30/2018 0930   TRIG 104 08/30/2018 0930   HDL 42 08/30/2018 0930   CHOLHDL 5.4 (H)  02/15/2018 1149  VLDL 25 09/24/2015 1049   LDLCALC 105 (H) 08/30/2018 0930   LDLCALC 151 (H) 02/15/2018 1149   Hepatic Function Panel     Component Value Date/Time   PROT 7.1 08/30/2018 0930   ALBUMIN 4.2 08/30/2018 0930   AST 19 08/30/2018 0930   ALT 20 08/30/2018 0930   ALKPHOS 100 08/30/2018 0930   BILITOT 0.4 08/30/2018 0930      Component Value Date/Time   TSH 1.430 08/30/2018 0930   Results for Blasingame, La L (MRN [id number]) as of 09/13/2018 16:44  Ref. Range 08/30/2018 09:30  Vitamin D, 25-Hydroxy Latest Ref Range: 30.0 - 100.0 ng/mL 28.3 (L)     OBESITY BEHAVIORAL INTERVENTION VISIT  Today's visit was # 2   Starting weight: 257 lbs Starting date: 08/30/2018 Today's weight : 25  Today's date: 09/13/2018 Total lbs lost to date: 6    09/13/2018  Height 5\' 9"  (1.753 m)  Weight 251 lb (113.9 kg)  BMI (Calculated) 37.05  BLOOD PRESSURE - SYSTOLIC 629  BLOOD PRESSURE - DIASTOLIC 91   Body Fat % 36 %  Total Body Water (lbs) 113.6 lbs    ASK: We discussed the diagnosis of obesity with Jackson Sherman today and Marcell agreed to give [place] permission to discuss obesity behavioral modification therapy today.  ASSESS: Ryden has the diagnosis of obesity and his BMI today is 37.05 Raiden is in the action stage of change   ADVISE: Paiton was educated on the multiple health risks of obesity as well as the benefit of weight loss to improve his health. He was advised of the need for long term treatment and the importance of lifestyle modifications to improve his current health and to decrease his risk of future health problems.  AGREE: Multiple dietary modification options and treatment options were discussed and  Orlandis agreed to follow the recommendations documented in the above note.  ARRANGE: Kelley was educated on the importance of frequent visits to treat obesity as outlined per CMS and USPSTF guidelines and agreed to schedule his next follow up appointment  today.  Corey Skains, am acting as Location manager for [employer]. Owens Shark, DO  I have reviewed the above documentation for accuracy and completeness, and I agree with the above. -Jearld Lesch, DO

## 2018-09-15 ENCOUNTER — Encounter (INDEPENDENT_AMBULATORY_CARE_PROVIDER_SITE_OTHER): Payer: Self-pay

## 2018-09-15 ENCOUNTER — Other Ambulatory Visit: Payer: Self-pay | Admitting: Family Medicine

## 2018-09-15 MED FILL — GABAPENTIN 300 MG CAPSULE: 300 | 45 days supply | Qty: 90 | Fill #0

## 2018-09-15 MED FILL — ATORVASTATIN 20 MG TABLET: 20 | 90 days supply | Qty: 90 | Fill #0

## 2018-09-20 ENCOUNTER — Encounter (INDEPENDENT_AMBULATORY_CARE_PROVIDER_SITE_OTHER): Payer: Self-pay

## 2018-09-28 ENCOUNTER — Ambulatory Visit (INDEPENDENT_AMBULATORY_CARE_PROVIDER_SITE_OTHER): Payer: No Typology Code available for payment source | Admitting: Bariatrics

## 2018-09-28 ENCOUNTER — Other Ambulatory Visit: Payer: Self-pay

## 2018-09-28 ENCOUNTER — Encounter (INDEPENDENT_AMBULATORY_CARE_PROVIDER_SITE_OTHER): Payer: Self-pay | Admitting: Bariatrics

## 2018-09-28 DIAGNOSIS — E559 Vitamin D deficiency, unspecified: Secondary | ICD-10-CM

## 2018-09-28 DIAGNOSIS — E88819 Insulin resistance, unspecified: Secondary | ICD-10-CM

## 2018-09-28 DIAGNOSIS — I10 Essential (primary) hypertension: Secondary | ICD-10-CM | POA: Diagnosis not present

## 2018-09-28 DIAGNOSIS — E66812 Obesity, class 2: Secondary | ICD-10-CM

## 2018-09-28 DIAGNOSIS — E8881 Metabolic syndrome: Secondary | ICD-10-CM

## 2018-09-28 DIAGNOSIS — Z6837 Body mass index (BMI) 37.0-37.9, adult: Secondary | ICD-10-CM

## 2018-09-28 MED ORDER — VITAMIN D (ERGOCALCIFEROL) 1.25 MG (50000 UNIT) PO CAPS: 50000.0000 [IU] | ORAL_CAPSULE | ORAL | 0 refills | Status: DC

## 2018-09-28 MED FILL — VIT D2 1.25 MG (50,000 UNIT: 1.25 MG | 28 days supply | Qty: 4 | Fill #0

## 2018-09-28 NOTE — Progress Notes (Signed)
Office: [phone]  /  Fax: [phone] TeleHealth Visit:  Jackson Sherman has verbally consented to this TeleHealth visit today. The patient is located at home, the provider is located at the [facility] office. The participants in this visit include the listed provider and patient. The visit was conducted today via Face Time.  HPI:   Chief Complaint: OBESITY Jackson Sherman is here to discuss his progress with his obesity treatment plan. He is on the Category 3 plan and is following his eating plan approximately 99 % of the time. He states he is walking 4 miles 3 times per week. Gotti states that he has lost about 4 pounds. He has scales at home. He has been stressed with COVID19. Lynford denies stress eating. He has been able to get the food that he needs.  We were unable to weigh the patient today for this TeleHealth visit. He feels as if he has lost weight since his last visit. He has lost 6 lbs since starting treatment with [place].  Vitamin D Deficiency Jackson Sherman has a diagnosis of vitamin D deficiency. He is currently taking high dose vit D.  Insulin Resistance Jackson Sherman has a diagnosis of insulin resistance based on his elevated fasting insulin level in 15.9 and his A1c was 5.8 on 08/30/18. Although Jackson Sherman's blood glucose readings are still under good control, insulin resistance puts him at greater risk of metabolic syndrome and diabetes. He is not taking metformin currently and continues to work on diet and exercise to decrease risk of diabetes. He denies polyphagia.  Hypertension Jackson Sherman is a 58 y.o. male with hypertension. Jackson Sherman is taking Cozaar 50 mg. He is working on weight loss to help control his blood pressure with the goal of decreasing his risk of heart attack and stroke.   ASSESSMENT AND PLAN:  Vitamin D deficiency - Plan: Vitamin D, Ergocalciferol, (DRISDOL) 1.25 MG (50000 UT) CAPS capsule  Insulin resistance  Essential hypertension  Class 2  severe obesity with serious comorbidity and body mass index (BMI) of 37.0 to 37.9 in adult, unspecified obesity type ([place])  PLAN:  Vitamin D Deficiency Jackson Sherman was informed that low vitamin D levels contribute to fatigue and are associated with obesity, breast, and colon cancer. Jackson Sherman agrees to continue to take prescription Vit D @50 ,000 IU every week #4 with no refills and will follow up for routine testing of vitamin D, at least 2-3 times per year. He was informed of the risk of over-replacement of vitamin D and agrees to not increase his dose unless he discusses this with [place] first. Ruddy agrees to follow up in 2 weeks as directed.  Insulin Resistance Jackson Sherman will continue to work on weight loss, exercise, and decreasing simple carbohydrates in his diet to help decrease the risk of diabetes. He was informed that eating too many simple carbohydrates or too many calories at one sitting increases the likelihood of GI side effects. Jackson Sherman agreed to increase protein and decrease carbohydrates in his diet. Jackson Sherman agreed to follow up with [place] as directed to monitor his progress.   Hypertension We discussed sodium restriction, working on healthy weight loss, and a regular exercise program as the means to achieve improved blood pressure control. We will continue to monitor his blood pressure as well as his progress with the above lifestyle modifications. He will continue his medications as prescribed and he will check his blood pressure at home. He will watch for signs of hypotension as he continues his lifestyle modifications.  Jackson Sherman agreed with this plan and agreed to follow up as directed.  Obesity Jackson Sherman is currently in the action stage of change. As such, his goal is to continue with weight loss efforts. He has agreed to follow the Category 3 plan with meal planning and increasing water intake. Jackson Sherman will weigh at home before each visit. Jackson Sherman has been instructed to work up to a goal of 150  minutes of combined cardio and strengthening exercise per week for weight loss and overall health benefits. We discussed the following Behavioral Modification Strategies today: increasing lean protein intake, decreasing simple carbohydrates, increasing vegetables, decrease eating out, increase H2O intake, no skipping meals, and keeping healthy foods in the home.  Jackson Sherman has agreed to follow up with our clinic in 2 weeks. He was informed of the importance of frequent follow up visits to maximize his success with intensive lifestyle modifications for his multiple health conditions.  ALLERGIES: No Known Allergies  MEDICATIONS: Current Outpatient Medications on File Prior to Visit  Medication Sig Dispense Refill  . atorvastatin (LIPITOR) 20 MG tablet Take 20 mg by mouth at bedtime 90 tablet 3  . diphenhydrAMINE (BENADRYL) 25 MG tablet Take 25 mg by mouth at bedtime as needed for sleep.     Marland Kitchen escitalopram (LEXAPRO) 20 MG tablet Take 1 tablet by mouth once daily 90 tablet 0  . gabapentin (NEURONTIN) 300 MG capsule Take 1 capsule (300 mg total) by mouth 3 (three) times daily. 1 cap PO QHS x 4 days, the increase to BID. 90 capsule 5  . loratadine (CLARITIN) 10 MG tablet Take 10 mg by mouth daily.    Marland Kitchen losartan (COZAAR) 50 MG tablet Take 1 tablet (50 mg total) by mouth daily. 90 tablet 1  . naproxen sodium (ALEVE) 220 MG tablet Take 220-440 mg by mouth 2 (two) times daily as needed (for pain).    Marland Kitchen omeprazole (PRILOSEC OTC) 20 MG tablet Take 20 mg by mouth daily before breakfast.    . sildenafil (REVATIO) 20 MG tablet Take 2-5 tablets (40-100 mg total) by mouth daily as needed. 50 tablet 4  . traZODone (DESYREL) 100 MG tablet Take 1 tablet (100 mg total) by mouth at bedtime. 90 tablet 1   No current facility-administered medications on file prior to visit.     PAST MEDICAL HISTORY: Past Medical History:  Diagnosis Date  . 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 Anxiety   . Depression   .  GERD (gastroesophageal reflux disease)   . Hemorrhoids   . Hip arthritis   . Hyperlipidemia   . Hypertension   . Joint pain   . Osteoarthritis   . Seizures ([place])    ?? in August ...more related to ETOH rehab    PAST SURGICAL HISTORY: Past Surgical History:  Procedure Laterality Date  . ARTHROSCOPIC REPAIR ACL     left  . FRACTURE SURGERY     no surgery on broke thumb  . HEMORRHOID SURGERY N/A 11/23/2012   Procedure: HEMORRHOIDECTOMY;  Surgeon: Madilyn Hook, DO;  Location: [facility];  Service: General;  Laterality: N/A;  . TONSILLECTOMY     Age 8   . 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 Smoking status: Former Smoker    Packs/day: 1.00    Years: 15.00    Pack years:  15.00    Types: Cigarettes    Last attempt to quit: 06/30/2003    Years since quitting: 15.2  . Smokeless tobacco: Never Used  Substance Use Topics  . Alcohol use: Yes    Comment: in current relapse  . Drug use: No    FAMILY HISTORY: Family History  Problem Relation Age of Onset  . Heart disease Father   . Heart disease Maternal Grandmother 57  . Stroke Maternal Grandmother   . Diabetes Maternal Grandmother   . Hyperlipidemia Mother   . Hypertension Mother   . Breast cancer Mother 69  . Depression Sister   . Diabetes Sister   . Diabetes Brother   . Diabetes Paternal Grandmother   . Stroke Paternal Grandmother     ROS: Review of Systems  Endo/Heme/Allergies:       Negative for polyphagia.    PHYSICAL EXAM: Pt in no acute distress  RECENT LABS AND TESTS: BMET    Component Value Date/Time   NA 140 08/30/2018 0930   K 4.6 08/30/2018 0930   CL 100 08/30/2018 0930   CO2 23 08/30/2018 0930   GLUCOSE 94 08/30/2018 0930   GLUCOSE 86 08/04/2018 1044   BUN 19 08/30/2018 0930   CREATININE 0.99 08/30/2018 0930    CREATININE 1.05 08/04/2018 1044   CALCIUM 9.0 08/30/2018 0930   GFRNONAA 84 08/30/2018 0930   GFRNONAA 78 08/04/2018 1044   GFRAA 97 08/30/2018 0930   GFRAA 90 08/04/2018 1044   Lab Results  Component Value Date   HGBA1C 5.8 (H) 08/04/2018   Lab Results  Component Value Date   INSULIN 15.9 08/30/2018   CBC    Component Value Date/Time   WBC 12.2 (H) 02/11/2017 1435   RBC 5.18 02/11/2017 1435   HGB 16.0 02/11/2017 1441   HCT 47.0 02/11/2017 1441   PLT 293 02/11/2017 1435   MCV 86.5 02/11/2017 1435   MCH 29.5 02/11/2017 1435   MCHC 34.2 02/11/2017 1435   RDW 14.7 02/11/2017 1435   LYMPHSABS 5.6 (H) 02/11/2017 1435   MONOABS 0.7 02/11/2017 1435   EOSABS 0.2 02/11/2017 1435   BASOSABS 0.1 02/11/2017 1435   Iron/TIBC/Ferritin/ %Sat No results found for: IRON, TIBC, FERRITIN, IRONPCTSAT Lipid Panel     Component Value Date/Time   CHOL 168 08/30/2018 0930   TRIG 104 08/30/2018 0930   HDL 42 08/30/2018 0930   CHOLHDL 5.4 (H) 02/15/2018 1149   VLDL 25 09/24/2015 1049   LDLCALC 105 (H) 08/30/2018 0930   LDLCALC 151 (H) 02/15/2018 1149   Hepatic Function Panel     Component Value Date/Time   PROT 7.1 08/30/2018 0930   ALBUMIN 4.2 08/30/2018 0930   AST 19 08/30/2018 0930   ALT 20 08/30/2018 0930   ALKPHOS 100 08/30/2018 0930   BILITOT 0.4 08/30/2018 0930      Component Value Date/Time   TSH 1.430 08/30/2018 0930   Results for Zazueta, Mclean L (MRN [id number]) as of 09/28/2018 15:46  Ref. Range 08/30/2018 09:30  Vitamin D, 25-Hydroxy Latest Ref Range: 30.0 - 100.0 ng/mL 28.3 (L)    I, Marcille Blanco, CMA, am acting as Location manager for [employer], DO.  I have reviewed the above documentation for accuracy and completeness, and I agree with the above. -Jearld Lesch, DO

## 2018-09-29 DIAGNOSIS — E559 Vitamin D deficiency, unspecified: Secondary | ICD-10-CM | POA: Insufficient documentation

## 2018-09-29 DIAGNOSIS — E118 Type 2 diabetes mellitus with unspecified complications: Secondary | ICD-10-CM | POA: Insufficient documentation

## 2018-09-29 DIAGNOSIS — E8881 Metabolic syndrome: Secondary | ICD-10-CM | POA: Insufficient documentation

## 2018-10-12 ENCOUNTER — Other Ambulatory Visit: Payer: Self-pay

## 2018-10-12 ENCOUNTER — Ambulatory Visit (INDEPENDENT_AMBULATORY_CARE_PROVIDER_SITE_OTHER): Payer: No Typology Code available for payment source | Admitting: Bariatrics

## 2018-10-12 ENCOUNTER — Encounter (INDEPENDENT_AMBULATORY_CARE_PROVIDER_SITE_OTHER): Payer: Self-pay | Admitting: Bariatrics

## 2018-10-12 DIAGNOSIS — R7303 Prediabetes: Secondary | ICD-10-CM

## 2018-10-12 DIAGNOSIS — Z6837 Body mass index (BMI) 37.0-37.9, adult: Secondary | ICD-10-CM | POA: Diagnosis not present

## 2018-10-12 DIAGNOSIS — I10 Essential (primary) hypertension: Secondary | ICD-10-CM | POA: Diagnosis not present

## 2018-10-12 NOTE — Progress Notes (Signed)
Office: [phone]  /  Fax: [phone] TeleHealth Visit:  Jackson Sherman has verbally consented to this TeleHealth visit today. The patient is located at home, the provider is located at the [facility] office. The participants in this visit include the listed provider and patient and any and all parties involved. The visit was conducted today via FaceTime.  HPI:   Chief Complaint: OBESITY Jackson Sherman is here to discuss his progress with his obesity treatment plan. He is on the Category 3 plan and is following his eating plan approximately 90 to 95 % of the time. He states he is walking 4 miles 3 times per week. Jackson Sherman states that he has lost about 4 pounds. He is doing well overall. He denies any stress eating and his appetite is okay. We were unable to weigh the patient today for this TeleHealth visit. He feels as if he has lost weight since his last visit. He has lost 10 lbs since starting treatment with [place].  Pre-Diabetes Jackson Sherman has a diagnosis of prediabetes based on his elevated Hgb A1c and was informed this puts him at greater risk of developing diabetes. His last A1c was at 5.8 and last insulin level was at 15.9 Jackson Sherman is not on medications (appetite controlled) and he continues to work on diet and exercise to decrease risk of diabetes. He denies nausea or hypoglycemia.  Hypertension Jackson Sherman is a 58 y.o. male with hypertension. He is currently taking Cozaar. Jackson Sherman denies lightheadedness. He is working weight loss to help control his blood pressure with the goal of decreasing his risk of heart attack and stroke. Jackson Sherman blood pressure is currently controlled.  ASSESSMENT AND PLAN:  Prediabetes  Essential hypertension  Class 2 severe obesity with serious comorbidity and body mass index (BMI) of 37.0 to 37.9 in adult, unspecified obesity type [place])  PLAN:  Pre-Diabetes Nehan will continue to work on weight loss, exercise, increasing  lean protein and decreasing simple carbohydrates in his diet to help decrease the risk of diabetes. He was informed that eating too many simple carbohydrates or too many calories at one sitting increases the likelihood of GI side effects. [place] agreed to follow up with [place] as directed to monitor his progress.  Hypertension We discussed sodium restriction, working on healthy weight loss, and a regular exercise program as the means to achieve improved blood pressure control. Aldair agreed with this plan and agreed to follow up as directed. We will continue to monitor his blood pressure as well as his progress with the above lifestyle modifications. He will continue his medications as prescribed and he will check his blood pressure at home. He will watch for signs of hypotension as he continues his lifestyle modifications.  Obesity Jackson Sherman is currently in the action stage of change. As such, his goal is to continue with weight loss efforts He has agreed to follow the Category 3 plan Jackson Sherman will continue walking 4 miles 3 times per week for weight loss and overall health benefits. We discussed the following Behavioral Modification Strategies today: planning for success, increase H2O intake, no skipping meals, keeping healthy foods in the home, increasing lean protein intake, decreasing simple carbohydrates, increasing vegetables, decrease eating out and work on meal planning and easy cooking plans  Jackson Sherman will weigh himself at home before his next visit.  Jackson Sherman has agreed to follow up with our clinic in 2 weeks. He was informed of the importance of frequent follow up visits to maximize  his success with intensive lifestyle modifications for his multiple health conditions.  ALLERGIES: No Known Allergies  MEDICATIONS: Current Outpatient Medications on File Prior to Visit  Medication Sig Dispense Refill  . atorvastatin (LIPITOR) 20 MG tablet Take 20 mg by mouth at bedtime 90 tablet 3  .  diphenhydrAMINE (BENADRYL) 25 MG tablet Take 25 mg by mouth at bedtime as needed for sleep.     Marland Kitchen escitalopram (LEXAPRO) 20 MG tablet Take 1 tablet by mouth once daily 90 tablet 0  . gabapentin (NEURONTIN) 300 MG capsule Take 1 capsule (300 mg total) by mouth 3 (three) times daily. 1 cap PO QHS x 4 days, the increase to BID. 90 capsule 5  . loratadine (CLARITIN) 10 MG tablet Take 10 mg by mouth daily.    Marland Kitchen losartan (COZAAR) 50 MG tablet Take 1 tablet (50 mg total) by mouth daily. 90 tablet 1  . naproxen sodium (ALEVE) 220 MG tablet Take 220-440 mg by mouth 2 (two) times daily as needed (for pain).    Marland Kitchen omeprazole (PRILOSEC OTC) 20 MG tablet Take 20 mg by mouth daily before breakfast.    . sildenafil (REVATIO) 20 MG tablet Take 2-5 tablets (40-100 mg total) by mouth daily as needed. 50 tablet 4  . traZODone (DESYREL) 100 MG tablet Take 1 tablet (100 mg total) by mouth at bedtime. 90 tablet 1  . Vitamin D, Ergocalciferol, (DRISDOL) 1.25 MG (50000 UT) CAPS capsule Take 1 capsule (50,000 Units total) by mouth every 7 (seven) days. 4 capsule 0   No current facility-administered medications on file prior to visit.     PAST MEDICAL HISTORY: Past Medical History:  Diagnosis Date  . 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 Anxiety   . Depression   . GERD (gastroesophageal reflux disease)   . Hemorrhoids   . Hip arthritis   . Hyperlipidemia   . Hypertension   . Joint pain   . Osteoarthritis   . Seizures ([place])    ?? in August ...more related to ETOH rehab    PAST SURGICAL HISTORY: Past Surgical History:  Procedure Laterality Date  . ARTHROSCOPIC REPAIR ACL     left  . FRACTURE SURGERY     no surgery on broke thumb  . HEMORRHOID SURGERY N/A 11/23/2012   Procedure: HEMORRHOIDECTOMY;  Surgeon: Madilyn Hook, DO;  Location: [facility];  Service: General;  Laterality: N/A;  . TONSILLECTOMY     Age 45   . 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 Smoking status: Former Smoker    Packs/day: 1.00    Years: 15.00    Pack years: 15.00    Types: Cigarettes    Last attempt to quit: 06/30/2003    Years since quitting: 15.2  . Smokeless tobacco: Never Used  Substance Use Topics  . Alcohol use: Yes    Comment: in current relapse  . Drug use: No    FAMILY HISTORY: Family History  Problem Relation Age of Onset  . Heart disease Father   . Heart disease Maternal Grandmother 81  . Stroke Maternal Grandmother   . Diabetes Maternal Grandmother   . Hyperlipidemia Mother   . Hypertension Mother   . Breast cancer Mother 17  . Depression Sister   . Diabetes Sister   . Diabetes Brother   .  Diabetes Paternal Grandmother   . Stroke Paternal Grandmother     ROS: Review of Systems  Constitutional: Positive for weight loss.  Gastrointestinal: Negative for nausea.  Neurological:       Negative for lightheadedness  Endo/Heme/Allergies:       Negative for hypoglycemia     PHYSICAL EXAM: Pt in no acute distress  RECENT LABS AND TESTS: BMET    Component Value Date/Time   NA 140 08/30/2018 0930   K 4.6 08/30/2018 0930   CL 100 08/30/2018 0930   CO2 23 08/30/2018 0930   GLUCOSE 94 08/30/2018 0930   GLUCOSE 86 08/04/2018 1044   BUN 19 08/30/2018 0930   CREATININE 0.99 08/30/2018 0930   CREATININE 1.05 08/04/2018 1044   CALCIUM 9.0 08/30/2018 0930   GFRNONAA 84 08/30/2018 0930   GFRNONAA 78 08/04/2018 1044   GFRAA 97 08/30/2018 0930   GFRAA 90 08/04/2018 1044   Lab Results  Component Value Date   HGBA1C 5.8 (H) 08/04/2018   Lab Results  Component Value Date   INSULIN 15.9 08/30/2018   CBC    Component Value Date/Time   WBC 12.2 (H) 02/11/2017 1435   RBC 5.18 02/11/2017 1435   HGB 16.0 02/11/2017 1441   HCT 47.0 02/11/2017 1441   PLT 293 02/11/2017 1435   MCV 86.5 02/11/2017  1435   MCH 29.5 02/11/2017 1435   MCHC 34.2 02/11/2017 1435   RDW 14.7 02/11/2017 1435   LYMPHSABS 5.6 (H) 02/11/2017 1435   MONOABS 0.7 02/11/2017 1435   EOSABS 0.2 02/11/2017 1435   BASOSABS 0.1 02/11/2017 1435   Iron/TIBC/Ferritin/ %Sat No results found for: IRON, TIBC, FERRITIN, IRONPCTSAT Lipid Panel     Component Value Date/Time   CHOL 168 08/30/2018 0930   TRIG 104 08/30/2018 0930   HDL 42 08/30/2018 0930   CHOLHDL 5.4 (H) 02/15/2018 1149   VLDL 25 09/24/2015 1049   LDLCALC 105 (H) 08/30/2018 0930   LDLCALC 151 (H) 02/15/2018 1149   Hepatic Function Panel     Component Value Date/Time   PROT 7.1 08/30/2018 0930   ALBUMIN 4.2 08/30/2018 0930   AST 19 08/30/2018 0930   ALT 20 08/30/2018 0930   ALKPHOS 100 08/30/2018 0930   BILITOT 0.4 08/30/2018 0930      Component Value Date/Time   TSH 1.430 08/30/2018 0930    Results for Fanton, Raygen L (MRN [id number]) as of 10/12/2018 16:33  Ref. Range 08/30/2018 09:30  Vitamin D, 25-Hydroxy Latest Ref Range: 30.0 - 100.0 ng/mL 28.3 (L)    I, Doreene Nest, am acting as Location manager for [employer]. Owens Shark, DO  I have reviewed the above documentation for accuracy and completeness, and I agree with the above. -Jearld Lesch, DO

## 2018-10-26 ENCOUNTER — Ambulatory Visit (INDEPENDENT_AMBULATORY_CARE_PROVIDER_SITE_OTHER): Payer: No Typology Code available for payment source | Admitting: Bariatrics

## 2018-10-26 ENCOUNTER — Encounter (INDEPENDENT_AMBULATORY_CARE_PROVIDER_SITE_OTHER): Payer: Self-pay | Admitting: Bariatrics

## 2018-10-26 ENCOUNTER — Other Ambulatory Visit: Payer: Self-pay

## 2018-10-26 DIAGNOSIS — I10 Essential (primary) hypertension: Secondary | ICD-10-CM

## 2018-10-26 DIAGNOSIS — Z6837 Body mass index (BMI) 37.0-37.9, adult: Secondary | ICD-10-CM

## 2018-10-26 DIAGNOSIS — R7303 Prediabetes: Secondary | ICD-10-CM | POA: Diagnosis not present

## 2018-10-27 NOTE — Progress Notes (Signed)
Office: [phone]  /  Fax: [phone] TeleHealth Visit:  Jackson Sherman has verbally consented to this TeleHealth visit today. The patient is located at home, the provider is located at the [facility] office. The participants in this visit include the listed provider and patient and any and all parties involved. The visit was conducted today via FaceTime.  HPI:   Chief Complaint: OBESITY Jackson Sherman is here to discuss his progress with his obesity treatment plan. He is on the Category 3 plan and is following his eating plan approximately 95 % of the time. He states he is running and walking 4 miles 3 times per week. Jackson Sherman states that he has lost 3.7 pounds (weight 237 lbs). He denies stress eating. Jackson Sherman is not struggling with the plan. We were unable to weigh the patient today for this TeleHealth visit. He feels as if he has lost weight since his last visit. He has lost 20 lbs since starting treatment with [place].  Pre-Diabetes Jackson Sherman has a diagnosis of prediabetes based on his elevated Hgb A1c and was informed this puts him at greater risk of developing diabetes. He is not taking medications currently and continues to work on diet and exercise to decrease risk of diabetes. He denies polyphagia.  Hypertension Jackson Sherman is a 58 y.o. male with hypertension. He is taking Cozaar. Jackson Sherman does not check his blood pressure at home. Jackson Sherman denies lightheadedness. He is working weight loss to help control his blood pressure with the goal of decreasing his risk of heart attack and stroke.   ASSESSMENT AND PLAN:  Prediabetes  Essential hypertension  Class 2 severe obesity with serious comorbidity and body mass index (BMI) of 37.0 to 37.9 in adult, unspecified obesity type Jackson Sherman)  PLAN:  Pre-Diabetes Jackson Sherman will continue to work on weight loss, exercise, increasing lean protein and decreasing simple carbohydrates in his diet to help decrease the risk of  diabetes. He was informed that eating too many simple carbohydrates or too many calories at one sitting increases the likelihood of GI side effects. Jackson Sherman agreed to follow up with [place] as directed to monitor his progress.  Hypertension We discussed sodium restriction, working on healthy weight loss, and a regular exercise program as the means to achieve improved blood pressure control. Jackson Sherman agreed with this plan and agreed to follow up as directed. We will continue to monitor his blood pressure as well as his progress with the above lifestyle modifications. He will continue his medications as prescribed and will watch for signs of hypotension as he continues his lifestyle modifications.  Obesity Jackson Sherman is currently in the action stage of change. As such, his goal is to continue with weight loss efforts He has agreed to follow the Category 3 plan Jackson Sherman will continue his exercise regimen for weight loss and overall health benefits. We discussed the following Behavioral Modification Strategies today: increase H2O intake, no skipping meals, keeping healthy foods in the home, increasing lean protein intake, decreasing simple carbohydrates, increasing vegetables, decrease eating out and work on meal planning and easy cooking plans Jackson Sherman will continue to weigh himself at home before each visit.  Jackson Sherman has agreed to follow up with our clinic in 2 weeks. He was informed of the importance of frequent follow up visits to maximize his success with intensive lifestyle modifications for his multiple health conditions.  ALLERGIES: No Known Allergies  MEDICATIONS: Current Outpatient Medications on File Prior to Visit  Medication Sig Dispense Refill  .  atorvastatin (LIPITOR) 20 MG tablet Take 20 mg by mouth at bedtime 90 tablet 3  . diphenhydrAMINE (BENADRYL) 25 MG tablet Take 25 mg by mouth at bedtime as needed for sleep.     Marland Kitchen escitalopram (LEXAPRO) 20 MG tablet Take 1 tablet by mouth once daily 90  tablet 0  . gabapentin (NEURONTIN) 300 MG capsule Take 1 capsule (300 mg total) by mouth 3 (three) times daily. 1 cap PO QHS x 4 days, the increase to BID. 90 capsule 5  . loratadine (CLARITIN) 10 MG tablet Take 10 mg by mouth daily.    Marland Kitchen losartan (COZAAR) 50 MG tablet Take 1 tablet (50 mg total) by mouth daily. 90 tablet 1  . naproxen sodium (ALEVE) 220 MG tablet Take 220-440 mg by mouth 2 (two) times daily as needed (for pain).    Marland Kitchen omeprazole (PRILOSEC OTC) 20 MG tablet Take 20 mg by mouth daily before breakfast.    . sildenafil (REVATIO) 20 MG tablet Take 2-5 tablets (40-100 mg total) by mouth daily as needed. 50 tablet 4  . traZODone (DESYREL) 100 MG tablet Take 1 tablet (100 mg total) by mouth at bedtime. 90 tablet 1  . Vitamin D, Ergocalciferol, (DRISDOL) 1.25 MG (50000 UT) CAPS capsule Take 1 capsule (50,000 Units total) by mouth every 7 (seven) days. 4 capsule 0   No current facility-administered medications on file prior to visit.     PAST MEDICAL HISTORY: Past Medical History:  Diagnosis Date  . 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 Anxiety   . Depression   . GERD (gastroesophageal reflux disease)   . Hemorrhoids   . Hip arthritis   . Hyperlipidemia   . Hypertension   . Joint pain   . Osteoarthritis   . Seizures ([place])    ?? in August ...more related to ETOH rehab    PAST SURGICAL HISTORY: Past Surgical History:  Procedure Laterality Date  . ARTHROSCOPIC REPAIR ACL     left  . FRACTURE SURGERY     no surgery on broke thumb  . HEMORRHOID SURGERY N/A 11/23/2012   Procedure: HEMORRHOIDECTOMY;  Surgeon: Madilyn Hook, DO;  Location: [facility];  Service: General;  Laterality: N/A;  . TONSILLECTOMY     Age 31   . 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 Smoking status: Former Smoker     Packs/day: 1.00    Years: 15.00    Pack years: 15.00    Types: Cigarettes    Last attempt to quit: 06/30/2003    Years since quitting: 15.3  . Smokeless tobacco: Never Used  Substance Use Topics  . Alcohol use: Yes    Comment: in current relapse  . Drug use: No    FAMILY HISTORY: Family History  Problem Relation Age of Onset  . Heart disease Father   . Heart disease Maternal Grandmother 8  . Stroke Maternal Grandmother   . Diabetes Maternal Grandmother   . Hyperlipidemia Mother   . Hypertension Mother   . Breast cancer Mother 65  . Depression Sister   . Diabetes Sister   . Diabetes Brother   . Diabetes Paternal Grandmother   . Stroke Paternal Grandmother     ROS: Review of Systems  Constitutional: Positive for weight loss.  Neurological:       Negative  for lightheadedness  Endo/Heme/Allergies:       Negative for polyphagia    PHYSICAL EXAM: Pt in no acute distress  RECENT LABS AND TESTS: BMET    Component Value Date/Time   NA 140 08/30/2018 0930   K 4.6 08/30/2018 0930   CL 100 08/30/2018 0930   CO2 23 08/30/2018 0930   GLUCOSE 94 08/30/2018 0930   GLUCOSE 86 08/04/2018 1044   BUN 19 08/30/2018 0930   CREATININE 0.99 08/30/2018 0930   CREATININE 1.05 08/04/2018 1044   CALCIUM 9.0 08/30/2018 0930   GFRNONAA 84 08/30/2018 0930   GFRNONAA 78 08/04/2018 1044   GFRAA 97 08/30/2018 0930   GFRAA 90 08/04/2018 1044   Lab Results  Component Value Date   HGBA1C 5.8 (H) 08/04/2018   Lab Results  Component Value Date   INSULIN 15.9 08/30/2018   CBC    Component Value Date/Time   WBC 12.2 (H) 02/11/2017 1435   RBC 5.18 02/11/2017 1435   HGB 16.0 02/11/2017 1441   HCT 47.0 02/11/2017 1441   PLT 293 02/11/2017 1435   MCV 86.5 02/11/2017 1435   MCH 29.5 02/11/2017 1435   MCHC 34.2 02/11/2017 1435   RDW 14.7 02/11/2017 1435   LYMPHSABS 5.6 (H) 02/11/2017 1435   MONOABS 0.7 02/11/2017 1435   EOSABS 0.2 02/11/2017 1435   BASOSABS 0.1 02/11/2017 1435    Iron/TIBC/Ferritin/ %Sat No results found for: IRON, TIBC, FERRITIN, IRONPCTSAT Lipid Panel     Component Value Date/Time   CHOL 168 08/30/2018 0930   TRIG 104 08/30/2018 0930   HDL 42 08/30/2018 0930   CHOLHDL 5.4 (H) 02/15/2018 1149   VLDL 25 09/24/2015 1049   LDLCALC 105 (H) 08/30/2018 0930   LDLCALC 151 (H) 02/15/2018 1149   Hepatic Function Panel     Component Value Date/Time   PROT 7.1 08/30/2018 0930   ALBUMIN 4.2 08/30/2018 0930   AST 19 08/30/2018 0930   ALT 20 08/30/2018 0930   ALKPHOS 100 08/30/2018 0930   BILITOT 0.4 08/30/2018 0930      Component Value Date/Time   TSH 1.430 08/30/2018 0930     Ref. Range 08/30/2018 09:30  Vitamin D, 25-Hydroxy Latest Ref Range: 30.0 - 100.0 ng/mL 28.3 (L)    I, Doreene Nest, am acting as Location manager for [employer]. Owens Shark, DO  I have reviewed the above documentation for accuracy and completeness, and I agree with the above. -Jearld Lesch, DO

## 2018-11-09 ENCOUNTER — Ambulatory Visit (INDEPENDENT_AMBULATORY_CARE_PROVIDER_SITE_OTHER): Payer: No Typology Code available for payment source | Admitting: Bariatrics

## 2018-11-09 ENCOUNTER — Other Ambulatory Visit: Payer: Self-pay

## 2018-11-09 DIAGNOSIS — Z6837 Body mass index (BMI) 37.0-37.9, adult: Secondary | ICD-10-CM

## 2018-11-09 DIAGNOSIS — E559 Vitamin D deficiency, unspecified: Secondary | ICD-10-CM

## 2018-11-09 DIAGNOSIS — R7303 Prediabetes: Secondary | ICD-10-CM

## 2018-11-09 MED ORDER — VITAMIN D (ERGOCALCIFEROL) 1.25 MG (50000 UNIT) PO CAPS: 50000.0000 [IU] | ORAL_CAPSULE | ORAL | 0 refills | Status: DC

## 2018-11-09 MED FILL — VIT D2 1.25 MG (50,000 UNIT: 1.25 MG | 28 days supply | Qty: 4 | Fill #0

## 2018-11-10 NOTE — Progress Notes (Signed)
Office: [phone]  /  Fax: [phone] TeleHealth Visit:  Jackson Sherman has verbally consented to this TeleHealth visit today. The patient is located at home, the provider is located at the [employer] [facility] office. The participants in this visit include the listed provider and patient and any and all parties involved. The visit was conducted today via FaceTime.  HPI:   Chief Complaint: OBESITY Jackson Sherman is here to discuss his progress with his obesity treatment plan. He is on the Category 3 plan and is following his eating plan approximately 90 % of the time. He states he is walking 60 minutes 4 times per week. Jackson Sherman states that he has lost 3 pounds (weight 235 lbs). He plans on having surgery on his right hip (increased pain with walking). We were unable to weigh the patient today for this TeleHealth visit. He feels as if he has lost weight since his last visit. He has lost 22 lbs since starting treatment with [place].  Pre-Diabetes Jackson Sherman has a diagnosis of prediabetes based on his elevated Hgb A1c and was informed this puts him at greater risk of developing diabetes. His last A1c was at 5.8 and last insulin level was at 15.9 Jackson Sherman is not taking medications and he continues to work on diet and exercise to decrease risk of diabetes. He denies nausea or hypoglycemia.  Vitamin D deficiency Jackson Sherman has a diagnosis of vitamin D deficiency. His last vitamin D level was at 28.3 He is Sherman taking vit D and denies nausea, vomiting or muscle weakness.  ASSESSMENT AND PLAN:  Prediabetes  Vitamin D deficiency - Plan: Vitamin D, Ergocalciferol, (DRISDOL) 1.25 MG (50000 UT) CAPS capsule  Class 2 severe obesity with serious comorbidity and body mass index (BMI) of 37.0 to 37.9 in adult, unspecified obesity type [facility])  PLAN:  Pre-Diabetes Jackson Sherman will continue to work on weight loss, exercise, increasing lean protein and decreasing simple carbohydrates in his diet to help  decrease the risk of diabetes. He was informed that eating too many simple carbohydrates or too many calories at one sitting increases the likelihood of GI side effects. Jackson Sherman agreed to follow up with [place] as directed to monitor his progress.  Vitamin D Deficiency Jackson Sherman was informed that low vitamin D levels contributes to fatigue and are associated with obesity, breast, and colon cancer. He Sherman to continue to take prescription Vit D @50 ,000 IU every week #4 with no refills and will follow up for routine testing of vitamin D, at least 2-3 times per year. He was informed of the risk of over-replacement of vitamin D and Sherman to not increase his dose unless he discusses this with [place] first. Jackson Sherman to follow up as directed.  Obesity Jackson Sherman in the action stage of change. As such, his goal is to continue with weight loss efforts He has agreed to follow the Category 3 plan Jackson Sherman will continue exercise per week for weight loss and overall health benefits. We discussed the following Behavioral Modification Strategies today: planning for success, increase H2O intake, no skipping meals, keeping healthy foods in the home, better snacking choices, increasing lean protein intake, decreasing simple carbohydrates, increasing vegetables, decrease eating out and work on meal planning and easy cooking plans Jackson Sherman will continue to weigh himself at home before each visit.  Jackson Sherman has agreed to follow up with our clinic in 2 weeks. He was informed of the importance of frequent follow up visits to maximize his success with intensive lifestyle  modifications for his multiple health conditions.  ALLERGIES: No Known Allergies  MEDICATIONS: Current Outpatient Medications on File Prior to Visit  Medication Sig Dispense Refill  . atorvastatin (LIPITOR) 20 MG tablet Take 20 mg by mouth at bedtime 90 tablet 3  . diphenhydrAMINE (BENADRYL) 25 MG tablet Take 25 mg by mouth at bedtime as needed  for sleep.     Marland Kitchen escitalopram (LEXAPRO) 20 MG tablet Take 1 tablet by mouth once daily 90 tablet 0  . gabapentin (NEURONTIN) 300 MG capsule Take 1 capsule (300 mg total) by mouth 3 (three) times daily. 1 cap PO QHS x 4 days, the increase to BID. 90 capsule 5  . loratadine (CLARITIN) 10 MG tablet Take 10 mg by mouth daily.    Marland Kitchen losartan (COZAAR) 50 MG tablet Take 1 tablet (50 mg total) by mouth daily. 90 tablet 1  . naproxen sodium (ALEVE) 220 MG tablet Take 220-440 mg by mouth 2 (two) times daily as needed (for pain).    Marland Kitchen omeprazole (PRILOSEC OTC) 20 MG tablet Take 20 mg by mouth daily before breakfast.    . sildenafil (REVATIO) 20 MG tablet Take 2-5 tablets (40-100 mg total) by mouth daily as needed. 50 tablet 4  . traZODone (DESYREL) 100 MG tablet Take 1 tablet (100 mg total) by mouth at bedtime. 90 tablet 1   No current facility-administered medications on file prior to visit.     PAST MEDICAL HISTORY: Past Medical History:  Diagnosis Date  . 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 Anxiety   . Depression   . GERD (gastroesophageal reflux disease)   . Hemorrhoids   . Hip arthritis   . Hyperlipidemia   . Hypertension   . Joint pain   . Osteoarthritis   . Seizures ([place])    ?? in August ...more related to ETOH rehab    PAST SURGICAL HISTORY: Past Surgical History:  Procedure Laterality Date  . ARTHROSCOPIC REPAIR ACL     left  . FRACTURE SURGERY     no surgery on broke thumb  . HEMORRHOID SURGERY N/A 11/23/2012   Procedure: HEMORRHOIDECTOMY;  Surgeon: Madilyn Hook, DO;  Location: [facility];  Service: General;  Laterality: N/A;  . TONSILLECTOMY     Age 41   . 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 Smoking status: Former Smoker    Packs/day: 1.00    Years: 15.00    Pack years: 15.00     Types: Cigarettes    Last attempt to quit: 06/30/2003    Years since quitting: 15.3  . Smokeless tobacco: Never Used  Substance Use Topics  . Alcohol use: Yes    Comment: in current relapse  . Drug use: No    FAMILY HISTORY: Family History  Problem Relation Age of Onset  . Heart disease Father   . Heart disease Maternal Grandmother 65  . Stroke Maternal Grandmother   . Diabetes Maternal Grandmother   . Hyperlipidemia Mother   . Hypertension Mother   . Breast cancer Mother 66  . Depression Sister   . Diabetes Sister   . Diabetes Brother   . Diabetes Paternal Grandmother   . Stroke Paternal Grandmother     ROS: Review of Systems  Constitutional: Positive for weight loss.  Gastrointestinal: Negative for nausea and vomiting.  Musculoskeletal:       Negative for muscle weakness  Endo/Heme/Allergies:       Negative for hypoglycemia    PHYSICAL EXAM: Pt in no acute distress  RECENT LABS AND TESTS: BMET    Component Value Date/Time   NA 140 08/30/2018 0930   K 4.6 08/30/2018 0930   CL 100 08/30/2018 0930   CO2 23 08/30/2018 0930   GLUCOSE 94 08/30/2018 0930   GLUCOSE 86 08/04/2018 1044   BUN 19 08/30/2018 0930   CREATININE 0.99 08/30/2018 0930   CREATININE 1.05 08/04/2018 1044   CALCIUM 9.0 08/30/2018 0930   GFRNONAA 84 08/30/2018 0930   GFRNONAA 78 08/04/2018 1044   GFRAA 97 08/30/2018 0930   GFRAA 90 08/04/2018 1044   Lab Results  Component Value Date   HGBA1C 5.8 (H) 08/04/2018   Lab Results  Component Value Date   INSULIN 15.9 08/30/2018   CBC    Component Value Date/Time   WBC 12.2 (H) 02/11/2017 1435   RBC 5.18 02/11/2017 1435   HGB 16.0 02/11/2017 1441   HCT 47.0 02/11/2017 1441   PLT 293 02/11/2017 1435   MCV 86.5 02/11/2017 1435   MCH 29.5 02/11/2017 1435   MCHC 34.2 02/11/2017 1435   RDW 14.7 02/11/2017 1435   LYMPHSABS 5.6 (H) 02/11/2017 1435   MONOABS 0.7 02/11/2017 1435   EOSABS 0.2 02/11/2017 1435   BASOSABS 0.1 02/11/2017 1435    Iron/TIBC/Ferritin/ %Sat No results found for: IRON, TIBC, FERRITIN, IRONPCTSAT Lipid Panel     Component Value Date/Time   CHOL 168 08/30/2018 0930   TRIG 104 08/30/2018 0930   HDL 42 08/30/2018 0930   CHOLHDL 5.4 (H) 02/15/2018 1149   VLDL 25 09/24/2015 1049   LDLCALC 105 (H) 08/30/2018 0930   LDLCALC 151 (H) 02/15/2018 1149   Hepatic Function Panel     Component Value Date/Time   PROT 7.1 08/30/2018 0930   ALBUMIN 4.2 08/30/2018 0930   AST 19 08/30/2018 0930   ALT 20 08/30/2018 0930   ALKPHOS 100 08/30/2018 0930   BILITOT 0.4 08/30/2018 0930      Component Value Date/Time   TSH 1.430 08/30/2018 0930     Ref. Range 08/30/2018 09:30  Vitamin D, 25-Hydroxy Latest Ref Range: 30.0 - 100.0 ng/mL 28.3 (L)    I, Doreene Nest, am acting as Location manager for [employer]. Owens Shark, DO  I have reviewed the above documentation for accuracy and completeness, and I agree with the above. -Jearld Lesch, DO

## 2018-11-14 ENCOUNTER — Encounter (INDEPENDENT_AMBULATORY_CARE_PROVIDER_SITE_OTHER): Payer: Self-pay | Admitting: Bariatrics

## 2018-11-15 ENCOUNTER — Other Ambulatory Visit: Payer: Self-pay | Admitting: Orthopedic Surgery

## 2018-11-15 MED FILL — LOSARTAN POTASSIUM 50 MG TA: 50 | 90 days supply | Qty: 90 | Fill #1

## 2018-11-23 ENCOUNTER — Ambulatory Visit (INDEPENDENT_AMBULATORY_CARE_PROVIDER_SITE_OTHER): Payer: No Typology Code available for payment source | Admitting: Bariatrics

## 2018-11-23 NOTE — Progress Notes (Signed)
SPOKE W/Pt _     SCREENING SYMPTOMS OF COVID 19:   COUGH--No  RUNNY NOSE--- No  SORE THROAT---No  NASAL CONGESTION----No  SNEEZING----No  SHORTNESS OF BREATH---No  DIFFICULTY BREATHING---No  TEMP >100.0 -----No  UNEXPLAINED BODY ACHES------No  CHILLS --------No   HEADACHES ---------No  LOSS OF SMELL/ TASTE --------No    HAVE YOU OR ANY FAMILY MEMBER TRAVELLED PAST 14 DAYS OUT OF THE   COUNTY---No STATE----No COUNTRY---No-  HAVE YOU OR ANY FAMILY MEMBER BEEN EXPOSED TO ANYONE WITH COVID 19? No    

## 2018-11-23 NOTE — Patient Instructions (Addendum)
Jackson Sherman  11/23/2018   Your procedure is scheduled on: 12-02-18    Report to [facility] Main  Entrance    Report to Admitting at 7:05 AM   YOU NEED TO HAVE A COVID 19 TEST ON 11-29-18 at 2:30 pm, THIS TEST MUST BE DONE BEFORE SURGERY, COME TO [place] ENTRANCE BETWEEN THE HOURS OF 900 AM AND 300 PM ON YOUR COVID TEST DATE.   Call this number if you have problems the morning of surgery [phone]    Remember: NO SOLID FOOD AFTER MIDNIGHT THE NIGHT PRIOR TO SURGERY. NOTHING BY MOUTH EXCEPT CLEAR LIQUIDS UNTIL 3 HOURS PRIOR TO [place] SURGERY. PLEASE FINISH ENSURE DRINK PER SURGEON ORDER 3 HOURS PRIOR TO SCHEDULED SURGERY TIME WHICH NEEDS TO BE COMPLETED AT 4:30 AM.   CLEAR LIQUID DIET   Foods Allowed                                                                     Foods Excluded  Coffee and tea, regular and decaf                             liquids that you cannot  Plain Jell-O in any flavor                                             see through such as: Fruit ices (not with fruit pulp)                                     milk, soups, orange juice  Iced Popsicles                                    All solid food Carbonated beverages, regular and diet                                    Cranberry, grape and apple juices Sports drinks like Gatorade Lightly seasoned clear broth or consume(fat free) Sugar, honey syrup  Sample Menu Breakfast                                Lunch                                     Supper Cranberry juice                    Beef broth                            Chicken broth Jell-O  Grape juice                           Apple juice Coffee or tea                        Jell-O                                      Popsicle                                                Coffee or tea                        Coffee or  tea  _____________________________________________________________________     BRUSH YOUR TEETH MORNING OF SURGERY AND RINSE YOUR MOUTH OUT, NO CHEWING GUM CANDY OR MINTS.     Take these medicines the morning of surgery with A SIP OF WATER: Loratadine (Claritin)                                You may not have any metal on your body including hair pins and              piercings     Do not wear jewelry, cologne, lotions, powders or deodorant                       Men may shave face and neck.   Do not bring valuables to the hospital. [name].  Contacts, dentures or bridgework may not be worn into surgery.    Special Instructions: N/A              Please read over the following fact sheets you were given: _____________________________________________________________________             [facility] - Preparing for Surgery Before surgery, you can play an important role.  Because skin is not sterile, your skin needs to be as free of germs as possible.  You can reduce the number of germs on your skin by washing with CHG (chlorahexidine gluconate) soap before surgery.  CHG is an antiseptic cleaner which kills germs and bonds with the skin to continue killing germs even after washing. Please DO NOT use if you have an allergy to CHG or antibacterial soaps.  If your skin becomes reddened/irritated stop using the CHG and inform your nurse when you arrive at Short Stay. Do not shave (including legs and underarms) for at least 48 hours prior to the first CHG shower.  You may shave your face/neck. Please follow these instructions carefully:  1.  Shower with CHG Soap the night before surgery and the  morning of Surgery.  2.  If you choose to wash your hair, wash your hair first as usual with your  normal  shampoo.  3.  After you shampoo, rinse your hair and body thoroughly to remove the  shampoo.  4.  Use CHG as you  would any other liquid soap.  You can apply chg directly  to the skin and wash                       Gently with a scrungie or clean washcloth.  5.  Apply the CHG Soap to your body ONLY FROM THE NECK DOWN.   Do not use on face/ open                           Wound or open sores. Avoid contact with eyes, ears mouth and genitals (private parts).                       Wash face,  Genitals (private parts) with your normal soap.             6.  Wash thoroughly, paying special attention to the area where your surgery  will be performed.  7.  Thoroughly rinse your body with warm water from the neck down.  8.  DO NOT shower/wash with your normal soap after using and rinsing off  the CHG Soap.                9.  Pat yourself dry with a clean towel.            10.  Wear clean pajamas.            11.  Place clean sheets on your bed the night of your first shower and do not  sleep with pets. Day of Surgery : Do not apply any lotions/deodorants the morning of surgery.  Please wear clean clothes to the hospital/surgery center.  FAILURE TO FOLLOW THESE INSTRUCTIONS MAY RESULT IN THE CANCELLATION OF YOUR SURGERY PATIENT SIGNATURE_________________________________  NURSE SIGNATURE__________________________________  ________________________________________________________________________   Adam Phenix  An incentive spirometer is a tool that can help keep your lungs clear and active. This tool measures how well you are filling your lungs with each breath. Taking long deep breaths may help reverse or decrease the chance of developing breathing (pulmonary) problems (especially infection) following:  A long period of time when you are unable to move or be active. BEFORE THE PROCEDURE   If the spirometer includes an indicator to show your best effort, your nurse or respiratory therapist will set it to a desired goal.  If possible, sit up straight or lean slightly forward. Try not to slouch.  Hold  the incentive spirometer in an upright position. INSTRUCTIONS FOR USE  1. Sit on the edge of your bed if possible, or sit up as far as you can in bed or on a chair. 2. Hold the incentive spirometer in an upright position. 3. Breathe out normally. 4. Place the mouthpiece in your mouth and seal your lips tightly around it. 5. Breathe in slowly and as deeply as possible, raising the piston or the ball toward the top of the column. 6. Hold your breath for 3-5 seconds or for as long as possible. Allow the piston or ball to fall to the bottom of the column. 7. Remove the mouthpiece from your mouth and breathe out normally. 8. Rest for a few seconds and repeat Steps 1 through 7 at least 10 times every 1-2 hours when you are awake. Take your time and take a few normal breaths between deep breaths. 9. The spirometer may include an indicator to  show your best effort. Use the indicator as a goal to work toward during each repetition. 10. After each set of 10 deep breaths, practice coughing to be sure your lungs are clear. If you have an incision (the cut made at the time of surgery), support your incision when coughing by placing a pillow or rolled up towels firmly against it. Once you are able to get out of bed, walk around indoors and cough well. You may stop using the incentive spirometer when instructed by your caregiver.  RISKS AND COMPLICATIONS  Take your time so you do not get dizzy or light-headed.  If you are in pain, you may need to take or ask for pain medication before doing incentive spirometry. It is harder to take a deep breath if you are having pain. AFTER USE  Rest and breathe slowly and easily.  It can be helpful to keep track of a log of your progress. Your caregiver can provide you with a simple table to help with this. If you are using the spirometer at home, follow these instructions: [place] IF:   You are having difficultly using the spirometer.  You have trouble  using the spirometer as often as instructed.  Your pain medication is not giving enough relief while using the spirometer.  You develop fever of 100.5 F (38.1 C) or higher. SEEK IMMEDIATE MEDICAL CARE IF:   You cough up bloody sputum that had not been present before.  You develop fever of 102 F (38.9 C) or greater.  You develop worsening pain at or near the incision site. MAKE SURE YOU:   Understand these instructions.  Will watch your condition.  Will get help right away if you are not doing well or get worse. Document Released: 10/26/2006 Document Revised: 09/07/2011 Document Reviewed: 12/27/2006 ExitCare Patient Information 2014 ExitCare, [place].   ________________________________________________________________________  WHAT IS A BLOOD TRANSFUSION? Blood Transfusion Information  A transfusion is the replacement of blood or some of its parts. Blood is made up of multiple cells which provide different functions.  Red blood cells carry oxygen and are used for blood loss replacement.  White blood cells fight against infection.  Platelets control bleeding.  Plasma helps clot blood.  Other blood products are available for specialized needs, such as hemophilia or other clotting disorders. BEFORE THE TRANSFUSION  Who gives blood for transfusions?   Healthy volunteers who are fully evaluated to make sure their blood is safe. This is blood bank blood. Transfusion therapy is the safest it has ever been in the practice of medicine. Before blood is taken from a donor, a complete history is taken to make sure that person has no history of diseases nor engages in risky social behavior (examples are intravenous drug use or sexual activity with multiple partners). The donor's travel history is screened to minimize risk of transmitting infections, such as malaria. The donated blood is tested for signs of infectious diseases, such as HIV and hepatitis. The blood is then tested to be  sure it is compatible with you in order to minimize the chance of a transfusion reaction. If you or a relative donates blood, this is often done in anticipation of surgery and is not appropriate for emergency situations. It takes many days to process the donated blood. RISKS AND COMPLICATIONS Although transfusion therapy is very safe and saves many lives, the main dangers of transfusion include:   Getting an infectious disease.  Developing a transfusion reaction. This is an allergic reaction  to something in the blood you were given. Every precaution is taken to prevent this. The decision to have a blood transfusion has been considered carefully by your caregiver before blood is given. Blood is not given unless the benefits outweigh the risks. AFTER THE TRANSFUSION  Right after receiving a blood transfusion, you will usually feel much better and more energetic. This is especially true if your red blood cells have gotten low (anemic). The transfusion raises the level of the red blood cells which carry oxygen, and this usually causes an energy increase.  The nurse administering the transfusion will monitor you carefully for complications. HOME CARE INSTRUCTIONS  No special instructions are needed after a transfusion. You may find your energy is better. Speak with your caregiver about any limitations on activity for underlying diseases you may have. SEEK MEDICAL CARE IF:   Your condition is not improving after your transfusion.  You develop redness or irritation at the intravenous (IV) site. SEEK IMMEDIATE MEDICAL CARE IF:  Any of the following symptoms occur over the next 12 hours:  Shaking chills.  You have a temperature by mouth above 102 F (38.9 C), not controlled by medicine.  Chest, back, or muscle pain.  People around you feel you are not acting correctly or are confused.  Shortness of breath or difficulty breathing.  Dizziness and fainting.  You get a rash or develop  hives.  You have a decrease in urine output.  Your urine turns a dark color or changes to pink, red, or brown. Any of the following symptoms occur over the next 10 days:  You have a temperature by mouth above 102 F (38.9 C), not controlled by medicine.  Shortness of breath.  Weakness after normal activity.  The white part of the eye turns yellow (jaundice).  You have a decrease in the amount of urine or are urinating less often.  Your urine turns a dark color or changes to pink, red, or brown. Document Released: 06/12/2000 Document Revised: 09/07/2011 Document Reviewed: 01/30/2008 [facility] Patient Information 2014 [place], [place].  _______________________________________________________________________

## 2018-11-23 NOTE — Progress Notes (Signed)
08-30-18 (Epic) EKG

## 2018-11-24 ENCOUNTER — Encounter (HOSPITAL_COMMUNITY)
Admission: RE | Admit: 2018-11-24 | Discharge: 2018-11-24 | Disposition: A | Discharge status: Home / Self Care | Payer: No Typology Code available for payment source | Source: Ambulatory Visit | Attending: Orthopedic Surgery | Admitting: Orthopedic Surgery

## 2018-11-24 ENCOUNTER — Encounter (INDEPENDENT_AMBULATORY_CARE_PROVIDER_SITE_OTHER): Payer: Self-pay | Admitting: Bariatrics

## 2018-11-24 ENCOUNTER — Ambulatory Visit (HOSPITAL_COMMUNITY)
Admission: RE | Admit: 2018-11-24 | Discharge: 2018-11-24 | Disposition: A | Discharge status: Home / Self Care | Payer: No Typology Code available for payment source | Source: Ambulatory Visit | Attending: Orthopedic Surgery | Admitting: Orthopedic Surgery

## 2018-11-24 ENCOUNTER — Encounter (HOSPITAL_COMMUNITY): Payer: Self-pay

## 2018-11-24 ENCOUNTER — Other Ambulatory Visit: Payer: Self-pay

## 2018-11-24 ENCOUNTER — Ambulatory Visit (INDEPENDENT_AMBULATORY_CARE_PROVIDER_SITE_OTHER): Payer: No Typology Code available for payment source | Admitting: Bariatrics

## 2018-11-24 DIAGNOSIS — Z01811 Encounter for preprocedural respiratory examination: Secondary | ICD-10-CM | POA: Insufficient documentation

## 2018-11-24 DIAGNOSIS — E7849 Other hyperlipidemia: Secondary | ICD-10-CM | POA: Diagnosis not present

## 2018-11-24 DIAGNOSIS — Z6837 Body mass index (BMI) 37.0-37.9, adult: Secondary | ICD-10-CM

## 2018-11-24 DIAGNOSIS — I10 Essential (primary) hypertension: Secondary | ICD-10-CM

## 2018-11-24 LAB — URINALYSIS, ROUTINE W REFLEX MICROSCOPIC
Bilirubin Urine: NEGATIVE
Glucose, UA: NEGATIVE mg/dL
Hgb urine dipstick: NEGATIVE
Ketones, ur: NEGATIVE mg/dL
Leukocytes,Ua: NEGATIVE
Nitrite: NEGATIVE
Protein, ur: NEGATIVE mg/dL
Specific Gravity, Urine: 1.017 (ref 1.005–1.030)
pH: 6 (ref 5.0–8.0)

## 2018-11-24 LAB — PROTIME-INR
INR: 1.1 (ref 0.8–1.2)
Prothrombin Time: 14 seconds (ref 11.4–15.2)

## 2018-11-24 LAB — SURGICAL PCR SCREEN
MRSA, PCR: NEGATIVE
Staphylococcus aureus: NEGATIVE

## 2018-11-24 LAB — CBC WITH DIFFERENTIAL/PLATELET
Abs Immature Granulocytes: 0.02 10*3/uL (ref 0.00–0.07)
Basophils Absolute: 0.1 10*3/uL (ref 0.0–0.1)
Basophils Relative: 1 %
Eosinophils Absolute: 0.2 10*3/uL (ref 0.0–0.5)
Eosinophils Relative: 2 %
HCT: 45.6 % (ref 39.0–52.0)
Hemoglobin: 14.8 g/dL (ref 13.0–17.0)
Immature Granulocytes: 0 %
Lymphocytes Relative: 28 %
Lymphs Abs: 2.7 10*3/uL (ref 0.7–4.0)
MCH: 28.9 pg (ref 26.0–34.0)
MCHC: 32.5 g/dL (ref 30.0–36.0)
MCV: 89.1 fL (ref 80.0–100.0)
Monocytes Absolute: 0.7 10*3/uL (ref 0.1–1.0)
Monocytes Relative: 7 %
Neutro Abs: 5.9 10*3/uL (ref 1.7–7.7)
Neutrophils Relative %: 62 %
Platelets: 207 10*3/uL (ref 150–400)
RBC: 5.12 MIL/uL (ref 4.22–5.81)
RDW: 13.7 % (ref 11.5–15.5)
WBC: 9.4 10*3/uL (ref 4.0–10.5)
nRBC: 0 % (ref 0.0–0.2)

## 2018-11-24 LAB — COMPREHENSIVE METABOLIC PANEL
ALT: 23 U/L (ref 0–44)
AST: 20 U/L (ref 15–41)
Albumin: 4 g/dL (ref 3.5–5.0)
Alkaline Phosphatase: 85 U/L (ref 38–126)
Anion gap: 6 (ref 5–15)
BUN: 21 mg/dL — ABNORMAL HIGH (ref 6–20)
CO2: 26 mmol/L (ref 22–32)
Calcium: 8.7 mg/dL — ABNORMAL LOW (ref 8.9–10.3)
Chloride: 106 mmol/L (ref 98–111)
Creatinine, Ser: 1.02 mg/dL (ref 0.61–1.24)
GFR calc Af Amer: >60 mL/min (ref >60)
GFR calc non Af Amer: >60 mL/min (ref >60)
Glucose, Bld: 99 mg/dL (ref 70–99)
Potassium: 4.1 mmol/L (ref 3.5–5.1)
Sodium: 138 mmol/L (ref 135–145)
Total Bilirubin: 0.4 mg/dL (ref 0.3–1.2)
Total Protein: 7.2 g/dL (ref 6.5–8.1)

## 2018-11-24 LAB — APTT: aPTT: 29 seconds (ref 24–36)

## 2018-11-24 LAB — HEMOGLOBIN A1C
Hgb A1c MFr Bld: 5.2 % (ref 4.8–5.6)
Mean Plasma Glucose: 102.54 mg/dL

## 2018-11-24 LAB — ABO/RH: ABO/RH(D): O POS

## 2018-11-24 NOTE — Progress Notes (Signed)
Office: [phone]  /  Fax: [phone] TeleHealth Visit:  Jackson Sherman has verbally consented to this TeleHealth visit today. The patient is located in his car, the provider is located at the [employer] [facility] office. The participants in this visit include the listed provider and patient and any and all parties involved. The visit was conducted today via FaceTime.  HPI:   Chief Complaint: OBESITY Jackson Sherman is here to discuss his progress with his obesity treatment plan. He is on the Category 3 plan and is following his eating plan approximately 80 % of the time. He states he is walking 60 minutes 2 times per week. [name] states that he will have his "hip replaced" on 12/02/18. He states that he has lost 2 to 3 pounds (weight 232 lbs). We were unable to weigh the patient today for this TeleHealth visit. He feels as if he has lost weight since his last visit. He has lost 25 lbs since starting treatment with [place].  Hypertension ARN MCOMBER is a 58 y.o. male with hypertension. He is taking Cozaar. His blood pressure is 117/76. Jackson Sherman denies chest pain or shortness of breath on exertion. He is working weight loss to help control his blood pressure with the goal of decreasing his risk of heart attack and stroke. [name] blood pressure is well controlled.  Hyperlipidemia Jackson Sherman has hyperlipidemia and he is taking Lipitor. He has been trying to improve his cholesterol levels with intensive lifestyle modification including a low saturated fat diet, exercise and weight loss. He denies myalgias.  ASSESSMENT AND PLAN:  Essential hypertension  Other hyperlipidemia  Class 2 severe obesity with serious comorbidity and body mass index (BMI) of 37.0 to 37.9 in adult, unspecified obesity type ([place])  PLAN:  Hypertension We discussed sodium restriction, working on healthy weight loss, and a regular exercise program as the means to achieve improved blood pressure control.  Garon agreed with this plan and agreed to follow up as directed. We will continue to monitor his blood pressure as well as his progress with the above lifestyle modifications. He will continue his medications as prescribed and will watch for signs of hypotension as he continues his lifestyle modifications.  Hyperlipidemia Markis was informed of the American Heart Association Guidelines emphasizing intensive lifestyle modifications as the first line treatment for hyperlipidemia. We discussed many lifestyle modifications today in depth, and Jarel will continue to work on decreasing saturated fats such as fatty red meat, butter and many fried foods. He will also increase vegetables and lean protein in his diet and continue to work on exercise and weight loss efforts. Hartman will continue his medications and follow up at the agreed upon time.  Obesity Jackson Sherman is currently in the action stage of change. As such, his goal is to continue with weight loss efforts He has agreed to follow the Category 3 plan Jackson Sherman will continue exercise after he is released per his surgeon and he will begin physical therapy post total hip surgery.  We discussed the following Behavioral Modification Strategies today: increase H2O intake, no skipping meals, keeping healthy foods in the home, increasing lean protein intake, decreasing simple carbohydrates, increasing vegetables, decrease eating out and work on meal planning and easy cooking plans Jackson Sherman will be less active after surgery, he will be sure to increase lean protein intake and decrease simple carbohydrates.  Jackson Sherman has agreed to follow up with our clinic in 2 weeks. He was informed of the importance of frequent follow  up visits to maximize his success with intensive lifestyle modifications for his multiple health conditions.  ALLERGIES: No Known Allergies  MEDICATIONS: Current Outpatient Medications on File Prior to Visit  Medication Sig Dispense Refill   . atorvastatin (LIPITOR) 20 MG tablet Take 20 mg by mouth at bedtime (Patient taking differently: Take 20 mg by mouth at bedtime. Take 20 mg by mouth at bedtime) 90 tablet 3  . diphenhydrAMINE (BENADRYL) 25 MG tablet Take 25 mg by mouth at bedtime as needed for sleep.     Jackson Sherman escitalopram (LEXAPRO) 20 MG tablet Take 1 tablet by mouth once daily (Patient taking differently: Take 20 mg by mouth daily. ) 90 tablet 0  . loratadine (CLARITIN) 10 MG tablet Take 10 mg by mouth daily.    Jackson Sherman losartan (COZAAR) 50 MG tablet Take 1 tablet (50 mg total) by mouth daily. 90 tablet 1  . naproxen sodium (ALEVE) 220 MG tablet Take 220-440 mg by mouth 2 (two) times daily as needed (for pain).    . sildenafil (REVATIO) 20 MG tablet Take 2-5 tablets (40-100 mg total) by mouth daily as needed. (Patient not taking: Reported on 11/24/2018) 50 tablet 4  . Vitamin D, Ergocalciferol, (DRISDOL) 1.25 MG (50000 UT) CAPS capsule Take 1 capsule (50,000 Units total) by mouth every 7 (seven) days. 4 capsule 0   No current facility-administered medications on file prior to visit.     PAST MEDICAL HISTORY: Past Medical History:  Diagnosis Date  . 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 Anxiety   . Depression   . GERD (gastroesophageal reflux disease)   . Hemorrhoids   . Hip arthritis   . Hyperlipidemia   . Hypertension   . Joint pain   . Osteoarthritis   . Seizures ([place])    ?? in August ...more related to ETOH rehab    PAST SURGICAL HISTORY: Past Surgical History:  Procedure Laterality Date  . ARTHROSCOPIC REPAIR ACL     left  . FRACTURE SURGERY     no surgery on broke thumb  . HEMORRHOID SURGERY N/A 11/23/2012   Procedure: HEMORRHOIDECTOMY;  Surgeon: Madilyn Hook, DO;  Location: [facility];  Service: General;  Laterality: N/A;  . TONSILLECTOMY     Age 58   . TOTAL KNEE ARTHROPLASTY Left 06/15/2016   Procedure: LEFT TOTAL KNEE ARTHROPLASTY;  Surgeon: Vickey Huger, MD;  Location: [facility];  Service: Orthopedics;   Laterality: Left;    SOCIAL HISTORY: Social History   Tobacco Use  . Smoking status: Former Smoker    Packs/day: 1.00    Years: 15.00    Pack years: 15.00    Types: Cigarettes    Last attempt to quit: 06/30/2003    Years since quitting: 15.4  . Smokeless tobacco: Never Used  Substance Use Topics  . Alcohol use: Not Currently  . Drug use: No    FAMILY HISTORY: Family History  Problem Relation Age of Onset  . Heart disease Father   . Heart disease Maternal Grandmother 45  . Stroke Maternal Grandmother   . Diabetes Maternal Grandmother   . Hyperlipidemia Mother   . Hypertension Mother   . Breast cancer Mother 43  . Depression Sister   . Diabetes Sister   . Diabetes Brother   . Diabetes Paternal Grandmother   . Stroke Paternal Grandmother     ROS: Review of Systems  Constitutional: Positive for weight loss.  Respiratory: Negative for shortness of breath (on exertion).   Cardiovascular: Negative for chest pain.  Musculoskeletal: Negative for myalgias.    PHYSICAL EXAM: Pt in no acute distress  RECENT LABS AND TESTS: BMET    Component Value Date/Time   NA 138 11/24/2018 0852   NA 140 08/30/2018 0930   K 4.1 11/24/2018 0852   CL 106 11/24/2018 0852   CO2 26 11/24/2018 0852   GLUCOSE 99 11/24/2018 0852   BUN 21 (H) 11/24/2018 0852   BUN 19 08/30/2018 0930   CREATININE 1.02 11/24/2018 0852   CREATININE 1.05 08/04/2018 1044   CALCIUM 8.7 (L) 11/24/2018 0852   GFRNONAA >60 11/24/2018 0852   GFRNONAA 78 08/04/2018 1044   GFRAA >60 11/24/2018 0852   GFRAA 90 08/04/2018 1044   Lab Results  Component Value Date   HGBA1C 5.2 11/24/2018   HGBA1C 5.8 (H) 08/04/2018   Lab Results  Component Value Date   INSULIN 15.9 08/30/2018   CBC    Component Value Date/Time   WBC 9.4 11/24/2018 0852   RBC 5.12 11/24/2018 0852   HGB 14.8 11/24/2018 0852   HCT 45.6 11/24/2018 0852   PLT 207 11/24/2018 0852   MCV 89.1 11/24/2018 0852   MCH 28.9 11/24/2018 0852   MCHC  32.5 11/24/2018 0852   RDW 13.7 11/24/2018 0852   LYMPHSABS 2.7 11/24/2018 0852   MONOABS 0.7 11/24/2018 0852   EOSABS 0.2 11/24/2018 0852   BASOSABS 0.1 11/24/2018 0852   Iron/TIBC/Ferritin/ %Sat No results found for: IRON, TIBC, FERRITIN, IRONPCTSAT Lipid Panel     Component Value Date/Time   CHOL 168 08/30/2018 0930   TRIG 104 08/30/2018 0930   HDL 42 08/30/2018 0930   CHOLHDL 5.4 (H) 02/15/2018 1149   VLDL 25 09/24/2015 1049   LDLCALC 105 (H) 08/30/2018 0930   LDLCALC 151 (H) 02/15/2018 1149   Hepatic Function Panel     Component Value Date/Time   PROT 7.2 11/24/2018 0852   PROT 7.1 08/30/2018 0930   ALBUMIN 4.0 11/24/2018 0852   ALBUMIN 4.2 08/30/2018 0930   AST 20 11/24/2018 0852   ALT 23 11/24/2018 0852   ALKPHOS 85 11/24/2018 0852   BILITOT 0.4 11/24/2018 0852   BILITOT 0.4 08/30/2018 0930      Component Value Date/Time   TSH 1.430 08/30/2018 0930    Results for Goracke, Zuhair L "PHIL" (MRN [id number]) as of 11/24/2018 17:23  Ref. Range 08/30/2018 09:30  Vitamin D, 25-Hydroxy Latest Ref Range: 30.0 - 100.0 ng/mL 28.3 (L)    I, Doreene Nest, am acting as Location manager for [employer]. Owens Shark, DO  I have reviewed the above documentation for accuracy and completeness, and I agree with the above. -Jearld Lesch, DO

## 2018-11-29 ENCOUNTER — Other Ambulatory Visit: Payer: Self-pay

## 2018-11-29 ENCOUNTER — Other Ambulatory Visit (HOSPITAL_COMMUNITY)
Admission: RE | Admit: 2018-11-29 | Discharge: 2018-11-29 | Disposition: A | Discharge status: Home / Self Care | Payer: No Typology Code available for payment source | Source: Ambulatory Visit | Attending: Orthopedic Surgery | Admitting: Orthopedic Surgery

## 2018-11-29 DIAGNOSIS — Z1159 Encounter for screening for other viral diseases: Secondary | ICD-10-CM | POA: Insufficient documentation

## 2018-11-30 ENCOUNTER — Other Ambulatory Visit: Payer: Self-pay | Admitting: *Deleted

## 2018-11-30 ENCOUNTER — Encounter: Payer: Self-pay | Admitting: *Deleted

## 2018-11-30 ENCOUNTER — Other Ambulatory Visit (HOSPITAL_COMMUNITY): Payer: No Typology Code available for payment source

## 2018-11-30 LAB — NOVEL CORONAVIRUS, NAA (HOSP ORDER, SEND-OUT TO REF LAB; TAT 18-24 HRS): SARS-CoV-2, NAA: NOT DETECTED

## 2018-11-30 NOTE — Patient Outreach (Signed)
[facility]) Care Management  11/30/2018  Jackson Sherman [birth year] [id number]  Preoperative Screening Call Referral received: 11/25/18 Surgery/procedure date: 12/02/18 Insurance: [place]  Subjective:  Initial successful telephone call to patient's mobile number in order to complete preoperative screening. 2 HIPAA identifiers verified. Discussed purpose of preoperative call. Patient voices understanding and agrees to call. He states he understands the reasons for the surgery and the expected time of arrival. He says he completed his preoperative  testing on 11/24/18 and asked about the results of her Hgb A1C as he says he has lost about 33 lbs in the last 3 months since attending [facility] [place]. He says he/she expects to be in the hospital 2-3 days.  He states he has completed medical leave paperwork and does have the hospital indemnity benefit and is aware he will have to file the claim after his surgery. He says he will have 24/7 care at home provided by his wife, who is a retired Marine scientist to assist in his recovery. He agrees to a post hospital discharge transition of care call.    Objective:  Per chart review and patient report, patient is scheduled for right total hip arthroplasty  on 12/02/18 at [facility]. He completed pre-op testing on 11/24/18.  Assessment: Preoperative call completed, no preoperative needs identified.  Phil advised of his Hgb A1C results on 11/24/18 of 5.2% (previously 5.8% on 08/04/18)  and this RNCM congratulated him on his successful weight loss and improved Hgb A1C.  Plan: RNCM will call patient for transition of care outreach within 72 hours of hospital discharge notification.  Barrington Ellison RN,CCM,CDE [facility] Management Coordinator Office Phone [phone] Office Fax [phone]

## 2018-12-01 MED ORDER — BUPIVACAINE LIPOSOME 1.3 % IJ SUSP
10.0000 mL | INTRAMUSCULAR | Status: DC
  Filled 2018-12-01: qty 10

## 2018-12-01 NOTE — H&P (Addendum)
TOTAL HIP ADMISSION H&P  Patient is admitted for right total hip arthroplasty.  Subjective:  Chief Complaint: right hip pain  HPI: Jackson Sherman, 58 y.o. male, has a history of pain and functional disability in the right hip(s) due to arthritis and patient has failed non-surgical conservative treatments for greater than 12 weeks to include NSAID's and/or analgesics, supervised PT with diminished ADL's post treatment, use of assistive devices, weight reduction as appropriate and activity modification.  Onset of symptoms was gradual starting 3 years ago with gradually worsening course since that time.The patient noted no past surgery on the right hip(s).  Patient currently rates pain in the right hip at 8 out of 10 with activity. Patient has night pain, worsening of pain with activity and weight bearing, trendelenberg gait, pain that interfers with activities of daily living, pain with passive range of motion and crepitus. Patient has evidence of subchondral sclerosis, periarticular osteophytes and joint space narrowing by imaging studies. This condition presents safety issues increasing the risk of falls. This patient has had avascular necrosis of the hip, acetabular fracture, hip dysplasia.  There is no current active infection.  Patient Active Problem List   Diagnosis Date Noted  . Vitamin D deficiency 09/29/2018  . Insulin resistance 09/29/2018  . Prediabetes 08/31/2018  . History of seizure 08/31/2018  . Right hip pain 08/04/2018  . Chronic left shoulder pain 12/22/2016  . 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 Chronic pain of left knee 04/09/2016  . Erectile dysfunction 03/16/2016  . PTSD (post-traumatic stress disorder) 03/16/2016  . Altered mental status 02/07/2016  . Seizure ([place])   . Alcohol abuse   . Elevated uric acid in blood 09/25/2015  . Depression 08/22/2012  . Insomnia 05/06/2010  . Hyperlipidemia 04/06/2006  . OBESITY, NOS 04/06/2006  . ALCOHOL DEPENDENCE  04/06/2006  . Essential hypertension 04/06/2006   Past Medical History:  Diagnosis Date  . 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 Anxiety   . Depression   . GERD (gastroesophageal reflux disease)   . Hemorrhoids   . Hip arthritis   . Hyperlipidemia   . Hypertension   . Joint pain   . Osteoarthritis   . Seizures ([place])    ?? in August ...more related to ETOH rehab    Past Surgical History:  Procedure Laterality Date  . ARTHROSCOPIC REPAIR ACL     left  . FRACTURE SURGERY     no surgery on broke thumb  . HEMORRHOID SURGERY N/A 11/23/2012   Procedure: HEMORRHOIDECTOMY;  Surgeon: Madilyn Hook, DO;  [facility];  Service: General;  Laterality: N/A;  . TONSILLECTOMY     Age 27   . TOTAL KNEE ARTHROPLASTY Left 06/15/2016   Procedure: LEFT TOTAL KNEE ARTHROPLASTY;  Surgeon: Vickey Huger, MD;  Location: [facility];  Service: Orthopedics;  Laterality: Left;    Current Facility-Administered Medications  Medication Dose Route Frequency Provider Last Rate Last Dose  . [START ON 12/02/2018] bupivacaine liposome (EXPAREL) 1.3 % injection 133 mg  10 mL Infiltration On Call to OR Dorna Leitz, MD       Current Outpatient Medications  Medication Sig Dispense Refill Last Dose  . atorvastatin (LIPITOR) 20 MG tablet Take 20 mg by mouth at bedtime (Patient taking differently: Take 20 mg by mouth at bedtime. Take 20 mg by mouth at bedtime) 90 tablet 3 Taking  . diphenhydrAMINE (BENADRYL) 25 MG tablet Take 25 mg by mouth at bedtime as needed for sleep.    Taking  .  escitalopram (LEXAPRO) 20 MG tablet Take 1 tablet by mouth once daily (Patient taking differently: Take 20 mg by mouth daily. ) 90 tablet 0   . loratadine (CLARITIN) 10 MG tablet Take 10 mg by mouth daily.   Taking  . losartan (COZAAR) 50 MG tablet Take 1 tablet (50 mg total) by mouth daily. 90 tablet 1 Taking  . naproxen sodium (ALEVE) 220 MG tablet Take 220-440 mg by mouth 2 (two) times daily as needed (for pain).   Taking  . Vitamin D,  Ergocalciferol, (DRISDOL) 1.25 MG (50000 UT) CAPS capsule Take 1 capsule (50,000 Units total) by mouth every 7 (seven) days. 4 capsule 0   . sildenafil (REVATIO) 20 MG tablet Take 2-5 tablets (40-100 mg total) by mouth daily as needed. (Patient not taking: Reported on 11/24/2018) 50 tablet 4 Not Taking at Unknown time   No Known Allergies  Social History   Tobacco Use  . Smoking status: Former Smoker    Packs/day: 1.00    Years: 15.00    Pack years: 15.00    Types: Cigarettes    Last attempt to quit: 06/30/2003    Years since quitting: 15.4  . Smokeless tobacco: Never Used  Substance Use Topics  . Alcohol use: Not Currently    Family History  Problem Relation Age of Onset  . Heart disease Father   . Heart disease Maternal Grandmother 57  . Stroke Maternal Grandmother   . Diabetes Maternal Grandmother   . Hyperlipidemia Mother   . Hypertension Mother   . Breast cancer Mother 68  . Depression Sister   . Diabetes Sister   . Diabetes Brother   . Diabetes Paternal Grandmother   . Stroke Paternal Grandmother      ROS ROS: I have reviewed the patient's review of systems thoroughly and there are no positive responses as relates to the HPI. Objective:  Physical Exam  Vital signs in last 24 hours:    Vitals:   12/02/18 0716  BP: 118/71  Pulse: (!) 57  Resp: 18  Temp: 98 F (36.7 C)  SpO2: 99%   Well-developed well-nourished patient in no acute distress. Alert and oriented x3 HEENT:within normal limits Cardiac: Regular rate and rhythm Pulmonary: Lungs clear to auscultation Abdomen: Soft and nontender.  Normal active bowel sounds  Musculoskeletal: (right hip: Limited range of motion.  Painful range of motion.  Limited internal rotation.  Neurovascularly intact distally. Labs: Recent Results (from the past 2160 hour(s))  ABO/Rh     Status: None   Collection Time: 11/24/18  8:41 AM  Result Value Ref Range   ABO/RH(D)      O POS Performed at [facility], [place] [address] [place], [place] [address]   APTT     Status: None   Collection Time: 11/24/18  8:52 AM  Result Value Ref Range   aPTT 29 24 - 36 seconds    Comment: Performed at [facility], [place] [address] [place], [place] [address]  CBC WITH DIFFERENTIAL     Status: None   Collection Time: 11/24/18  8:52 AM  Result Value Ref Range   WBC 9.4 4.0 - 10.5 K/uL   RBC 5.12 4.22 - 5.81 MIL/uL   Hemoglobin 14.8 13.0 - 17.0 g/dL   HCT 45.6 39.0 - 52.0 %   MCV 89.1 80.0 - 100.0 fL   MCH 28.9 26.0 - 34.0 pg   MCHC 32.5 30.0 - 36.0 g/dL   RDW 13.7 11.5 -  15.5 %   Platelets 207 150 - 400 K/uL   nRBC 0.0 0.0 - 0.2 %   Neutrophils Relative % 62 %   Neutro Abs 5.9 1.7 - 7.7 K/uL   Lymphocytes Relative 28 %   Lymphs Abs 2.7 0.7 - 4.0 K/uL   Monocytes Relative 7 %   Monocytes Absolute 0.7 0.1 - 1.0 K/uL   Eosinophils Relative 2 %   Eosinophils Absolute 0.2 0.0 - 0.5 K/uL   Basophils Relative 1 %   Basophils Absolute 0.1 0.0 - 0.1 K/uL   Immature Granulocytes 0 %   Abs Immature Granulocytes 0.02 0.00 - 0.07 K/uL    Comment: Performed at [facility], [place] [address] [place], [place] [address]  Comprehensive metabolic panel     Status: Abnormal   Collection Time: 11/24/18  8:52 AM  Result Value Ref Range   Sodium 138 135 - 145 mmol/L   Potassium 4.1 3.5 - 5.1 mmol/L   Chloride 106 98 - 111 mmol/L   CO2 26 22 - 32 mmol/L   Glucose, Bld 99 70 - 99 mg/dL   BUN 21 (H) 6 - 20 mg/dL   Creatinine, Ser 1.02 0.61 - 1.24 mg/dL   Calcium 8.7 (L) 8.9 - 10.3 mg/dL   Total Protein 7.2 6.5 - 8.1 g/dL   Albumin 4.0 3.5 - 5.0 g/dL   AST 20 15 - 41 U/L   ALT 23 0 - 44 U/L   Alkaline Phosphatase 85 38 - 126 U/L   Total Bilirubin 0.4 0.3 - 1.2 mg/dL   GFR calc non Af Amer >60 >60 mL/min   GFR calc Af Amer >60 >60 mL/min   Anion gap 6 5 - 15    Comment: Performed at [facility], [place] [address] [place], [place] [address]   Protime-INR     Status: None   Collection Time: 11/24/18  8:52 AM  Result Value Ref Range   Prothrombin Time 14.0 11.4 - 15.2 seconds   INR 1.1 0.8 - 1.2    Comment: (NOTE) INR goal varies based on device and disease states. Performed at [facility], [place] [address], [place], [place] [address]   Type and screen Order type and screen if day of surgery is less than 15 days from draw of preadmission visit or order morning of surgery if day of surgery is greater than 6 days from preadmission visit.     Status: None   Collection Time: 11/24/18  8:52 AM  Result Value Ref Range   ABO/RH(D) O POS    Antibody Screen NEG    Sample Expiration 12/08/2018,2359    Extend sample reason      NO TRANSFUSIONS OR PREGNANCY IN THE PAST 3 MONTHS Performed at [facility], [place] [address], [place], [place] [address]   Urinalysis, Routine w reflex microscopic     Status: None   Collection Time: 11/24/18  8:52 AM  Result Value Ref Range   Color, Urine YELLOW YELLOW   APPearance CLEAR CLEAR   Specific Gravity, Urine 1.017 1.005 - 1.030   pH 6.0 5.0 - 8.0   Glucose, UA NEGATIVE NEGATIVE mg/dL   Hgb urine dipstick NEGATIVE NEGATIVE   Bilirubin Urine NEGATIVE NEGATIVE   Ketones, ur NEGATIVE NEGATIVE mg/dL   Protein, ur NEGATIVE NEGATIVE mg/dL   Nitrite NEGATIVE NEGATIVE   Leukocytes,Ua NEGATIVE NEGATIVE    Comment: Performed at [place] [address] [place], [place]  [address]  Surgical pcr screen     Status: None   Collection Time: 11/24/18  8:52 AM  Result Value Ref Range   MRSA, PCR NEGATIVE NEGATIVE   Staphylococcus aureus NEGATIVE NEGATIVE    Comment: (NOTE) The Xpert SA Assay (FDA approved for NASAL specimens in patients 10 years of age and older), is one component of a comprehensive surveillance program. It is not intended to diagnose infection nor to guide or monitor treatment. Performed at [facility], [place] [address] [place], [place] [address]   Hemoglobin A1c     Status: None   Collection Time: 11/24/18  8:52 AM  Result Value Ref Range   Hgb A1c MFr Bld 5.2 4.8 - 5.6 %    Comment: (NOTE) Pre diabetes:          5.7%-6.4% Diabetes:              >6.4% Glycemic control for   <7.0% adults with diabetes    Mean Plasma Glucose 102.54 mg/dL    Comment: Performed at [place] [address], [place], [place] [address]  Novel Coronavirus, NAA (hospital order; send-out to ref lab)     Status: None   Collection Time: 11/29/18  2:19 PM  Result Value Ref Range   SARS-CoV-2, NAA NOT DETECTED NOT DETECTED    Comment: (NOTE) This test was developed and its performance characteristics determined by [employer]. This test has not been FDA cleared or approved. This test has been authorized by FDA under an Emergency Use Authorization (EUA). This test is only authorized for the duration of time the declaration that circumstances exist justifying the authorization of the emergency use of in vitro diagnostic tests for detection of SARS-CoV-2 virus and/or diagnosis of COVID-19 infection under section 564(b)(1) of the Act, 21 U.S.C. 884ZYS-0(Y)(3), unless the authorization is terminated or revoked sooner. When diagnostic testing is negative, the possibility of a false negative result should be considered in the context of a patient's recent exposures and the presence of clinical signs and symptoms consistent with COVID-19. An individual without symptoms of COVID-19 and who is not shedding SARS-CoV-2 virus would expect to have a negative (not detected) result in this assay. Performed  At: [facility] [place], [place] [id number] Rush Farmer MD TF:[id number]    Coronavirus Source NASOPHARYNGEAL     Comment: Performed at [facility], [place] [address], [place], [place] [address]    Estimated body mass index is 34.95 kg/m as calculated from the  following:   Height as of 11/24/18: 5\' 10"  (1.778 m).   Weight as of 11/24/18: 110.5 kg.   Imaging Review Plain radiographs demonstrate severe degenerative joint disease of the right hip(s). The bone quality appears to be good for age and reported activity level.      Assessment/Plan:  End stage arthritis, right hip(s)  The patient history, physical examination, clinical judgement of the provider and imaging studies are consistent with end stage degenerative joint disease of the right hip(s) and total hip arthroplasty is deemed medically necessary. The treatment options including medical management, injection therapy, arthroscopy and arthroplasty were discussed at length. The risks and benefits of total hip arthroplasty were presented and reviewed. The risks due to aseptic loosening, infection, stiffness, dislocation/subluxation,  thromboembolic complications and other imponderables were discussed.  The patient acknowledged the explanation, agreed to proceed with the plan and consent was signed. Patient is being admitted for inpatient treatment for surgery, pain  control, PT, OT, prophylactic antibiotics, VTE prophylaxis, progressive ambulation and ADL's and discharge planning.The patient is planning to be discharged home with home health services

## 2018-12-01 NOTE — Progress Notes (Signed)
SPOKE W/  _Patient     SCREENING SYMPTOMS OF COVID 19: denies all   COUGH--no  RUNNY NOSE--- no  SORE THROAT---no  NASAL CONGESTION----no  SNEEZING--no--  SHORTNESS OF BREATH---no  DIFFICULTY BREATHING---no  TEMP >100.0 -----no  UNEXPLAINED BODY ACHES------no  CHILLS ------no--   HEADACHES -----no----  LOSS OF SMELL/ TASTE --------no    HAVE YOU OR ANY FAMILY MEMBER TRAVELLED PAST 14 DAYS OUT OF THE   COUNTY---no  STATE----no COUNTRY----no  HAVE YOU OR ANY FAMILY MEMBER BEEN EXPOSED TO ANYONE WITH COVID 19? denies

## 2018-12-02 ENCOUNTER — Ambulatory Visit (HOSPITAL_COMMUNITY): Payer: No Typology Code available for payment source | Admitting: Certified Registered"

## 2018-12-02 ENCOUNTER — Ambulatory Visit (HOSPITAL_COMMUNITY): Payer: No Typology Code available for payment source

## 2018-12-02 ENCOUNTER — Encounter (HOSPITAL_COMMUNITY)
Admission: RE | Disposition: A | Discharge status: Home Health | Payer: Self-pay | Source: Home / Self Care | Attending: Orthopedic Surgery

## 2018-12-02 ENCOUNTER — Encounter (HOSPITAL_COMMUNITY): Payer: Self-pay | Admitting: *Deleted

## 2018-12-02 ENCOUNTER — Observation Stay (HOSPITAL_COMMUNITY)
Admission: RE | Admit: 2018-12-02 | Discharge: 2018-12-03 | Disposition: A | Discharge status: Home Health | Payer: No Typology Code available for payment source | Attending: Orthopedic Surgery | Admitting: Orthopedic Surgery

## 2018-12-02 ENCOUNTER — Ambulatory Visit (HOSPITAL_COMMUNITY): Payer: No Typology Code available for payment source | Admitting: Physician Assistant

## 2018-12-02 ENCOUNTER — Other Ambulatory Visit: Payer: Self-pay

## 2018-12-02 DIAGNOSIS — F4310 Post-traumatic stress disorder, unspecified: Secondary | ICD-10-CM | POA: Insufficient documentation

## 2018-12-02 DIAGNOSIS — N529 Male erectile dysfunction, unspecified: Secondary | ICD-10-CM | POA: Diagnosis not present

## 2018-12-02 DIAGNOSIS — Z6834 Body mass index (BMI) 34.0-34.9, adult: Secondary | ICD-10-CM | POA: Insufficient documentation

## 2018-12-02 DIAGNOSIS — Z87891 Personal history of nicotine dependence: Secondary | ICD-10-CM | POA: Diagnosis not present

## 2018-12-02 DIAGNOSIS — E559 Vitamin D deficiency, unspecified: Secondary | ICD-10-CM | POA: Diagnosis not present

## 2018-12-02 DIAGNOSIS — E669 Obesity, unspecified: Secondary | ICD-10-CM | POA: Insufficient documentation

## 2018-12-02 DIAGNOSIS — R569 Unspecified convulsions: Secondary | ICD-10-CM | POA: Diagnosis not present

## 2018-12-02 DIAGNOSIS — K219 Gastro-esophageal reflux disease without esophagitis: Secondary | ICD-10-CM | POA: Insufficient documentation

## 2018-12-02 DIAGNOSIS — Z8249 Family history of ischemic heart disease and other diseases of the circulatory system: Secondary | ICD-10-CM | POA: Insufficient documentation

## 2018-12-02 DIAGNOSIS — R262 Difficulty in walking, not elsewhere classified: Secondary | ICD-10-CM | POA: Insufficient documentation

## 2018-12-02 DIAGNOSIS — Z79899 Other long term (current) drug therapy: Secondary | ICD-10-CM | POA: Diagnosis not present

## 2018-12-02 DIAGNOSIS — E785 Hyperlipidemia, unspecified: Secondary | ICD-10-CM | POA: Insufficient documentation

## 2018-12-02 DIAGNOSIS — M1611 Unilateral primary osteoarthritis, right hip: Secondary | ICD-10-CM | POA: Diagnosis present

## 2018-12-02 DIAGNOSIS — I10 Essential (primary) hypertension: Secondary | ICD-10-CM | POA: Insufficient documentation

## 2018-12-02 DIAGNOSIS — G4700 Insomnia, unspecified: Secondary | ICD-10-CM | POA: Diagnosis not present

## 2018-12-02 DIAGNOSIS — R7303 Prediabetes: Secondary | ICD-10-CM | POA: Insufficient documentation

## 2018-12-02 DIAGNOSIS — F329 Major depressive disorder, single episode, unspecified: Secondary | ICD-10-CM | POA: Diagnosis not present

## 2018-12-02 DIAGNOSIS — M25559 Pain in unspecified hip: Secondary | ICD-10-CM

## 2018-12-02 DIAGNOSIS — Z1159 Encounter for screening for other viral diseases: Secondary | ICD-10-CM | POA: Insufficient documentation

## 2018-12-02 DIAGNOSIS — F1010 Alcohol abuse, uncomplicated: Secondary | ICD-10-CM | POA: Insufficient documentation

## 2018-12-02 HISTORY — PX: TOTAL HIP ARTHROPLASTY: SHX124

## 2018-12-02 HISTORY — PX: JOINT REPLACEMENT: SHX530

## 2018-12-02 LAB — TYPE AND SCREEN
ABO/RH(D): O POS
Antibody Screen: NEGATIVE

## 2018-12-02 SURGERY — ARTHROPLASTY, HIP, TOTAL, ANTERIOR APPROACH
Anesthesia: Spinal | Site: Hip | Laterality: Right

## 2018-12-02 MED ORDER — GABAPENTIN 300 MG PO CAPS
300.0000 mg | ORAL_CAPSULE | Freq: Two times a day (BID) | ORAL | Status: DC
  Administered 2018-12-02 – 2018-12-03 (×2): 300 mg via ORAL
  Filled 2018-12-02 (×2): qty 1

## 2018-12-02 MED ORDER — ALUM & MAG HYDROXIDE-SIMETH 200-200-20 MG/5ML PO SUSP: 30.0000 mL | ORAL | Status: DC | PRN

## 2018-12-02 MED ORDER — PROPOFOL 500 MG/50ML IV EMUL
INTRAVENOUS | Status: DC | PRN
  Administered 2018-12-02: 50 ug/kg/min via INTRAVENOUS

## 2018-12-02 MED ORDER — DEXAMETHASONE SODIUM PHOSPHATE 10 MG/ML IJ SOLN
INTRAMUSCULAR | Status: DC | PRN
  Administered 2018-12-02: 10 mg via INTRAVENOUS

## 2018-12-02 MED ORDER — POLYETHYLENE GLYCOL 3350 17 G PO PACK: 17.0000 g | PACK | Freq: Every day | ORAL | Status: DC | PRN

## 2018-12-02 MED ORDER — PROPOFOL 10 MG/ML IV BOLUS
INTRAVENOUS | Status: AC
  Filled 2018-12-02: qty 60

## 2018-12-02 MED ORDER — LIDOCAINE 2% (20 MG/ML) 5 ML SYRINGE
INTRAMUSCULAR | Status: DC | PRN
  Administered 2018-12-02: 60 mg via INTRAVENOUS

## 2018-12-02 MED ORDER — ESCITALOPRAM OXALATE 20 MG PO TABS
20.0000 mg | ORAL_TABLET | Freq: Every day | ORAL | Status: DC
  Administered 2018-12-02 – 2018-12-03 (×2): 20 mg via ORAL
  Filled 2018-12-02 (×2): qty 1

## 2018-12-02 MED ORDER — DEXAMETHASONE SODIUM PHOSPHATE 10 MG/ML IJ SOLN
INTRAMUSCULAR | Status: AC
  Filled 2018-12-02: qty 1

## 2018-12-02 MED ORDER — PHENYLEPHRINE HCL (PRESSORS) 10 MG/ML IV SOLN
INTRAVENOUS | Status: AC
  Filled 2018-12-02: qty 1

## 2018-12-02 MED ORDER — METHOCARBAMOL 1000 MG/10ML IJ SOLN
500.0000 mg | Freq: Four times a day (QID) | INTRAVENOUS | Status: DC | PRN
  Filled 2018-12-02: qty 5

## 2018-12-02 MED ORDER — FENTANYL CITRATE (PF) 100 MCG/2ML IJ SOLN
INTRAMUSCULAR | Status: AC
  Filled 2018-12-02: qty 2

## 2018-12-02 MED ORDER — PROMETHAZINE HCL 25 MG/ML IJ SOLN: 6.2500 mg | INTRAMUSCULAR | Status: DC | PRN

## 2018-12-02 MED ORDER — DIPHENHYDRAMINE HCL 12.5 MG/5ML PO ELIX: 12.5000 mg | ORAL_SOLUTION | ORAL | Status: DC | PRN

## 2018-12-02 MED ORDER — ACETAMINOPHEN 325 MG PO TABS: 325.0000 mg | ORAL_TABLET | Freq: Four times a day (QID) | ORAL | Status: DC | PRN

## 2018-12-02 MED ORDER — HYDROMORPHONE HCL 1 MG/ML IJ SOLN
0.5000 mg | INTRAMUSCULAR | Status: DC | PRN
  Administered 2018-12-02: 0.5 mg via INTRAVENOUS
  Filled 2018-12-02: qty 1

## 2018-12-02 MED ORDER — BISACODYL 5 MG PO TBEC: 5.0000 mg | DELAYED_RELEASE_TABLET | Freq: Every day | ORAL | Status: DC | PRN

## 2018-12-02 MED ORDER — LOSARTAN POTASSIUM 50 MG PO TABS
50.0000 mg | ORAL_TABLET | Freq: Every day | ORAL | Status: DC
  Administered 2018-12-02 – 2018-12-03 (×2): 50 mg via ORAL
  Filled 2018-12-02 (×2): qty 1

## 2018-12-02 MED ORDER — BUPIVACAINE LIPOSOME 1.3 % IJ SUSP
INTRAMUSCULAR | Status: DC | PRN
  Administered 2018-12-02: 10 mL

## 2018-12-02 MED ORDER — LACTATED RINGERS IV SOLN
INTRAVENOUS | Status: DC | PRN
  Administered 2018-12-02: 10:00:00 via INTRAVENOUS

## 2018-12-02 MED ORDER — PROPOFOL 10 MG/ML IV BOLUS
INTRAVENOUS | Status: AC
  Filled 2018-12-02: qty 20

## 2018-12-02 MED ORDER — DEXAMETHASONE SODIUM PHOSPHATE 10 MG/ML IJ SOLN
10.0000 mg | Freq: Two times a day (BID) | INTRAMUSCULAR | Status: DC
  Administered 2018-12-02 – 2018-12-03 (×2): 10 mg via INTRAVENOUS
  Filled 2018-12-02 (×2): qty 1

## 2018-12-02 MED ORDER — MIDAZOLAM HCL 2 MG/2ML IJ SOLN
INTRAMUSCULAR | Status: DC | PRN
  Administered 2018-12-02: 2 mg via INTRAVENOUS

## 2018-12-02 MED ORDER — ONDANSETRON HCL 4 MG/2ML IJ SOLN: 4.0000 mg | Freq: Four times a day (QID) | INTRAMUSCULAR | Status: DC | PRN

## 2018-12-02 MED ORDER — ONDANSETRON HCL 4 MG PO TABS: 4.0000 mg | ORAL_TABLET | Freq: Four times a day (QID) | ORAL | Status: DC | PRN

## 2018-12-02 MED ORDER — BUPIVACAINE IN DEXTROSE 0.75-8.25 % IT SOLN
INTRATHECAL | Status: DC | PRN
  Administered 2018-12-02: 1.6 mL via INTRATHECAL

## 2018-12-02 MED ORDER — 0.9 % SODIUM CHLORIDE (POUR BTL) OPTIME
TOPICAL | Status: DC | PRN
  Administered 2018-12-02: 1000 mL

## 2018-12-02 MED ORDER — EPHEDRINE SULFATE-NACL 50-0.9 MG/10ML-% IV SOSY
PREFILLED_SYRINGE | INTRAVENOUS | Status: DC | PRN
  Administered 2018-12-02 (×4): 5 mg via INTRAVENOUS
  Administered 2018-12-02: 10 mg via INTRAVENOUS

## 2018-12-02 MED ORDER — SODIUM CHLORIDE 0.9 % IV SOLN
INTRAVENOUS | Status: DC
  Administered 2018-12-02: 15:00:00 via INTRAVENOUS

## 2018-12-02 MED ORDER — LACTATED RINGERS IV SOLN
INTRAVENOUS | Status: DC
  Administered 2018-12-02: 08:00:00 via INTRAVENOUS

## 2018-12-02 MED ORDER — EPHEDRINE 5 MG/ML INJ
INTRAVENOUS | Status: AC
  Filled 2018-12-02: qty 10

## 2018-12-02 MED ORDER — DOCUSATE SODIUM 100 MG PO CAPS
100.0000 mg | ORAL_CAPSULE | Freq: Two times a day (BID) | ORAL | Status: DC
  Administered 2018-12-02 – 2018-12-03 (×2): 100 mg via ORAL
  Filled 2018-12-02 (×2): qty 1

## 2018-12-02 MED ORDER — SODIUM CHLORIDE 0.9% FLUSH
INTRAVENOUS | Status: DC | PRN
  Administered 2018-12-02: 20 mL

## 2018-12-02 MED ORDER — FENTANYL CITRATE (PF) 100 MCG/2ML IJ SOLN
INTRAMUSCULAR | Status: DC | PRN
  Administered 2018-12-02 (×2): 25 ug via INTRAVENOUS
  Administered 2018-12-02: 50 ug via INTRAVENOUS

## 2018-12-02 MED ORDER — MIDAZOLAM HCL 2 MG/2ML IJ SOLN
INTRAMUSCULAR | Status: AC
  Filled 2018-12-02: qty 2

## 2018-12-02 MED ORDER — CEFAZOLIN SODIUM-DEXTROSE 2-4 GM/100ML-% IV SOLN
2.0000 g | INTRAVENOUS | Status: AC
  Administered 2018-12-02: 2 g via INTRAVENOUS
  Filled 2018-12-02: qty 100

## 2018-12-02 MED ORDER — ONDANSETRON HCL 4 MG/2ML IJ SOLN
INTRAMUSCULAR | Status: AC
  Filled 2018-12-02: qty 2

## 2018-12-02 MED ORDER — METHOCARBAMOL 500 MG PO TABS
500.0000 mg | ORAL_TABLET | Freq: Four times a day (QID) | ORAL | Status: DC | PRN
  Administered 2018-12-02: 500 mg via ORAL
  Filled 2018-12-02 (×2): qty 1

## 2018-12-02 MED ORDER — WATER FOR IRRIGATION, STERILE IR SOLN
Status: DC | PRN
  Administered 2018-12-02: 2000 mL

## 2018-12-02 MED ORDER — TRANEXAMIC ACID-NACL 1000-0.7 MG/100ML-% IV SOLN
1000.0000 mg | Freq: Once | INTRAVENOUS | Status: AC
  Administered 2018-12-02: 1000 mg via INTRAVENOUS
  Filled 2018-12-02: qty 100

## 2018-12-02 MED ORDER — ATORVASTATIN CALCIUM 20 MG PO TABS
20.0000 mg | ORAL_TABLET | Freq: Every day | ORAL | Status: DC
  Administered 2018-12-02: 20 mg via ORAL
  Filled 2018-12-02: qty 1

## 2018-12-02 MED ORDER — OXYCODONE HCL 5 MG/5ML PO SOLN: 5.0000 mg | Freq: Once | ORAL | Status: DC | PRN

## 2018-12-02 MED ORDER — DOCUSATE SODIUM 100 MG PO CAPS: 100.0000 mg | ORAL_CAPSULE | Freq: Two times a day (BID) | ORAL | 0 refills | Status: DC

## 2018-12-02 MED ORDER — OXYCODONE-ACETAMINOPHEN 5-325 MG PO TABS: 1.0000 | ORAL_TABLET | Freq: Four times a day (QID) | ORAL | 0 refills | Status: DC | PRN

## 2018-12-02 MED ORDER — SODIUM CHLORIDE (PF) 0.9 % IJ SOLN
INTRAMUSCULAR | Status: AC
  Filled 2018-12-02: qty 50

## 2018-12-02 MED ORDER — POVIDONE-IODINE 10 % EX SWAB
2.0000 "application " | Freq: Once | CUTANEOUS | Status: AC
  Administered 2018-12-02: 2 via TOPICAL

## 2018-12-02 MED ORDER — OXYCODONE HCL 5 MG PO TABS
5.0000 mg | ORAL_TABLET | ORAL | Status: DC | PRN
  Administered 2018-12-02 – 2018-12-03 (×5): 10 mg via ORAL
  Filled 2018-12-02 (×5): qty 2

## 2018-12-02 MED ORDER — ONDANSETRON HCL 4 MG/2ML IJ SOLN
INTRAMUSCULAR | Status: DC | PRN
  Administered 2018-12-02: 4 mg via INTRAVENOUS

## 2018-12-02 MED ORDER — BUPIVACAINE-EPINEPHRINE (PF) 0.25% -1:200000 IJ SOLN
INTRAMUSCULAR | Status: DC | PRN
  Administered 2018-12-02: 30 mL

## 2018-12-02 MED ORDER — ASPIRIN EC 325 MG PO TBEC
325.0000 mg | DELAYED_RELEASE_TABLET | Freq: Two times a day (BID) | ORAL | Status: DC
  Administered 2018-12-02 – 2018-12-03 (×2): 325 mg via ORAL
  Filled 2018-12-02 (×2): qty 1

## 2018-12-02 MED ORDER — BUPIVACAINE-EPINEPHRINE (PF) 0.25% -1:200000 IJ SOLN
INTRAMUSCULAR | Status: AC
  Filled 2018-12-02: qty 30

## 2018-12-02 MED ORDER — CHLORHEXIDINE GLUCONATE 4 % EX LIQD: 60.0000 mL | Freq: Once | CUTANEOUS | Status: DC

## 2018-12-02 MED ORDER — LIDOCAINE 2% (20 MG/ML) 5 ML SYRINGE
INTRAMUSCULAR | Status: AC
  Filled 2018-12-02: qty 5

## 2018-12-02 MED ORDER — TIZANIDINE HCL 2 MG PO TABS: 2.0000 mg | ORAL_TABLET | Freq: Three times a day (TID) | ORAL | 0 refills | Status: DC | PRN

## 2018-12-02 MED ORDER — CEFAZOLIN SODIUM-DEXTROSE 2-4 GM/100ML-% IV SOLN
2.0000 g | Freq: Four times a day (QID) | INTRAVENOUS | Status: AC
  Administered 2018-12-02 (×2): 2 g via INTRAVENOUS
  Filled 2018-12-02 (×2): qty 100

## 2018-12-02 MED ORDER — MAGNESIUM CITRATE PO SOLN: 1.0000 | Freq: Once | ORAL | Status: DC | PRN

## 2018-12-02 MED ORDER — OXYCODONE HCL 5 MG PO TABS: 5.0000 mg | ORAL_TABLET | Freq: Once | ORAL | Status: DC | PRN

## 2018-12-02 MED ORDER — TRANEXAMIC ACID-NACL 1000-0.7 MG/100ML-% IV SOLN
1000.0000 mg | INTRAVENOUS | Status: AC
  Administered 2018-12-02: 1000 mg via INTRAVENOUS
  Filled 2018-12-02: qty 100

## 2018-12-02 MED ORDER — ASPIRIN EC 325 MG PO TBEC: 325.0000 mg | DELAYED_RELEASE_TABLET | Freq: Two times a day (BID) | ORAL | 0 refills | Status: DC

## 2018-12-02 MED ORDER — FENTANYL CITRATE (PF) 100 MCG/2ML IJ SOLN: 25.0000 ug | INTRAMUSCULAR | Status: DC | PRN

## 2018-12-02 MED ORDER — PROPOFOL 10 MG/ML IV BOLUS
INTRAVENOUS | Status: DC | PRN
  Administered 2018-12-02: 20 mg via INTRAVENOUS

## 2018-12-02 MED FILL — OXYCODONE-ACETAMINOPHEN 5-3: 5-325 | 5 days supply | Qty: 40 | Fill #0

## 2018-12-02 MED FILL — tiZANidine HCL 2 MG TABS: 2 | 13 days supply | Qty: 40 | Fill #0

## 2018-12-02 SURGICAL SUPPLY — 40 items
APL SKNCLS STERI-STRIP NONHPOA (GAUZE/BANDAGES/DRESSINGS) ×1
BAG SPEC THK2 15X12 ZIP CLS (MISCELLANEOUS)
BAG ZIPLOCK 12X15 (MISCELLANEOUS) IMPLANT
BENZOIN TINCTURE PRP APPL 2/3 (GAUZE/BANDAGES/DRESSINGS) ×2 IMPLANT
BLADE SAW SGTL 18X1.27X75 (BLADE) ×2 IMPLANT
BLADE SAW SGTL 18X1.27X75MM (BLADE) ×1
BLADE SURG SZ10 CARB STEEL (BLADE) ×6 IMPLANT
CLOSURE WOUND 1/2 X4 (GAUZE/BANDAGES/DRESSINGS) ×1
COVER PERINEAL POST (MISCELLANEOUS) ×3 IMPLANT
COVER SURGICAL LIGHT HANDLE (MISCELLANEOUS) ×3 IMPLANT
COVER WAND RF STERILE (DRAPES) IMPLANT
DRAPE STERI IOBAN 125X83 (DRAPES) ×3 IMPLANT
DRAPE U-SHAPE 47X51 STRL (DRAPES) ×6 IMPLANT
DRSG AQUACEL AG ADV 3.5X 6 (GAUZE/BANDAGES/DRESSINGS) ×3 IMPLANT
DURAPREP 26ML APPLICATOR (WOUND CARE) ×3 IMPLANT
ELECT BLADE TIP CTD 4 INCH (ELECTRODE) ×3 IMPLANT
ELECT REM PT RETURN 15FT ADLT (MISCELLANEOUS) ×3 IMPLANT
ELIMINATOR HOLE APEX DEPUY (Hips) ×2 IMPLANT
GAUZE XEROFORM 1X8 LF (GAUZE/BANDAGES/DRESSINGS) IMPLANT
GLOVE BIOGEL PI IND STRL 8 (GLOVE) ×2 IMPLANT
GLOVE BIOGEL PI INDICATOR 8 (GLOVE) ×4
GLOVE ECLIPSE 7.5 STRL STRAW (GLOVE) ×6 IMPLANT
GOWN STRL REUS W/TWL XL LVL3 (GOWN DISPOSABLE) ×6 IMPLANT
HEAD CERAMIC DELTA 36 PLUS 1.5 (Hips) ×2 IMPLANT
HOLDER FOLEY CATH W/STRAP (MISCELLANEOUS) ×3 IMPLANT
HOOD PEEL AWAY FLYTE STAYCOOL (MISCELLANEOUS) ×6 IMPLANT
KIT TURNOVER KIT A (KITS) IMPLANT
LINER NEUTRAL 52MMX36MMX56N (Liner) ×2 IMPLANT
NEEDLE HYPO 22GX1.5 SAFETY (NEEDLE) ×3 IMPLANT
PACK ANTERIOR HIP CUSTOM (KITS) ×3 IMPLANT
PIN SECT CUP 56MM (Hips) ×2 IMPLANT
STAPLER VISISTAT 35W (STAPLE) IMPLANT
STEM CORAIL KA12 (Stem) ×2 IMPLANT
STRIP CLOSURE SKIN 1/2X4 (GAUZE/BANDAGES/DRESSINGS) ×1 IMPLANT
SUT ETHIBOND NAB CT1 #1 30IN (SUTURE) ×6 IMPLANT
SUT MNCRL AB 3-0 PS2 18 (SUTURE) IMPLANT
SUT VIC AB 0 CT1 36 (SUTURE) ×3 IMPLANT
SUT VIC AB 1 CT1 36 (SUTURE) ×3 IMPLANT
TRAY FOLEY MTR SLVR 16FR STAT (SET/KITS/TRAYS/PACK) ×3 IMPLANT
YANKAUER SUCT BULB TIP 10FT TU (MISCELLANEOUS) ×3 IMPLANT

## 2018-12-02 NOTE — Brief Op Note (Signed)
12/02/2018  11:33 AM  PATIENT:  Jackson Sherman  58 y.o. male  PRE-OPERATIVE DIAGNOSIS:  RIGHT TOTAL HIP ARTHROPLASTY  POST-OPERATIVE DIAGNOSIS:  RIGHT TOTAL HIP ARTHROPLASTY  PROCEDURE:  Procedure(s): TOTAL HIP ARTHROPLASTY ANTERIOR APPROACH (Right)  SURGEON:  Surgeon(s) and Role:    Dorna Leitz, MD - Primary  PHYSICIAN ASSISTANT:   ASSISTANTS: jim bethune   ANESTHESIA:   spinal  EBL:  300 mL   BLOOD ADMINISTERED:none  DRAINS: none   LOCAL MEDICATIONS USED:  MARCAINE    and OTHER experel  SPECIMEN:  No Specimen  DISPOSITION OF SPECIMEN:  N/A  COUNTS:  YES  TOURNIQUET:  * No tourniquets in log *  DICTATION: .Other Dictation: Dictation Number [id number]  PLAN OF CARE: Admit for overnight observation  PATIENT DISPOSITION:  PACU - hemodynamically stable.   Delay start of Pharmacological VTE agent (>24hrs) due to surgical blood loss or risk of bleeding: no

## 2018-12-02 NOTE — Op Note (Signed)
NAME: Jackson Sherman, Jackson Sherman. MEDICAL RECORD [id number] ACCOUNT [id number] DATE OF BIRTH:1961-05-23 FACILITY: [facility] LOCATION: [facility] PHYSICIAN:Delita Chiquito Sherman. Caral Whan, MD  OPERATIVE REPORT  DATE OF PROCEDURE:  12/02/2018  PREOPERATIVE DIAGNOSIS:  End-stage degenerative joint disease, right hip.  POSTOPERATIVE DIAGNOSIS:  End-stage degenerative joint disease, right hip.  PROCEDURE: 1.  Right total hip replacement with the Corail stem size 12, 56 mm Pinnacle porous-coated cup, a 4 neutral liner, and a 36 mm +1.5 delta ceramic hip ball. 2.  Interpretation of multiple intraoperative fluoroscopic images.  SURGEON:  Dorna Leitz, MD  ASSISTANT:  Gaspar Skeeters, PA-C, was present for the entire case and assisted by retraction, manipulation of the leg, and closing to minimize OR time.  BRIEF HISTORY:  The patient is a 58 year old male with a long history of significant complaints of right hip pain that had been treated conservatively for a prolonged period of time.  X-ray showed bone-on-bone change.  He was having night pain and light  activity pain.  After failure of all conservative care, he was taken to the operating room for right total hip replacement.  DESCRIPTION OF PROCEDURE:  The patient was taken to the operating room after adequate anesthesia was obtained with a spinal anesthetic and placed supine on the operating table.  The patient was then moved onto the Hana bed and all bony prominences well  padded.  Attention was turned to the right hip where after routine prep and drape an incision was made for an anterior approach to the hip, subcutaneous tissue down to the level of the tensor fascia, which was divided in line with its fibers.  The muscle  was finger dissected and retractors put in place above and below the neck.  The capsule was then opened and tagged.  The provisional neck cut was then made, and attention was then turned to the acetabulum.  It was sequentially rasped, reamed up to a   level of 55 mm, and a 56 mm Pinnacle porous-coated cup was hammered into place, 30 degrees of anteversion, 45 degrees of lateral opening.  Once this was put in place, the hole eliminator was placed, followed by the neutral liner.  Once this was  completed, attention was turned to the stem side.  It was rasped up to a level of 10.  I could not even get a 10 rasp in.  It was not fitting anywhere near appropriately up top.  I got flexible reamers out and reamed them up to a 12 distally and then  ultimately went back with a Corail and got him rasped up to a 12.  Trial reduction was undertaken and looked to be appropriate leg lengthwise, so at this point, we pulled the rasp out, put the final implant in.  It seated all the way.  I put a +1.5  ceramic hip ball on and did a reduction, which leg lengths were symmetric.  At this point, offset was good.  At this point, the wounds were irrigated, suctioned dry, and the capsule was closed with 1 Vicryl running, skin with the tensor fascia closed  with 0 Vicryl running, skin with 0 and 2-0 Vicryl and 3-0 Monocryl subcuticular.  Benzoin and Steri-Strips were applied, a sterile compressive dressing was applied, and the patient was taken to recovery and was noted to be in satisfactory condition.   Estimated blood loss for the procedure was approximately 300 mL.  The final can be gotten from the anesthetic record.  Of note, Gaspar Skeeters was present for the  entire case and assisted throughout the entire case.  He used fluoroscopy throughout the  entire case as well to help with assessing the cup position, fit and fill of the stem, and leg lengths.  LN/NUANCE  D:12/02/2018 T:12/02/2018 JOB:[id number]

## 2018-12-02 NOTE — Anesthesia Procedure Notes (Signed)
Procedure Name: MAC Date/Time: 12/02/2018 9:42 AM Performed by: Eben Burow, CRNA Pre-anesthesia Checklist: Patient identified, Emergency Drugs available, Suction available, Patient being monitored and Timeout performed Oxygen Delivery Method: Simple face mask Dental Injury: Teeth and Oropharynx as per pre-operative assessment

## 2018-12-02 NOTE — Progress Notes (Signed)
Patient was c/o of sever pain from bladder spasm, has good urine output from the foley bag; page provider on call and order to remove foley cath; foley removed at 2215, patient voided at 2338, urine was pink and some blood clots. Will continue to monitor patient.

## 2018-12-02 NOTE — Evaluation (Signed)
Physical Therapy Evaluation Patient Details Name: Jackson Sherman MRN: [id number] DOB: December 10, 1960 Today's Date: 12/02/2018   History of Present Illness  s/p R DA THA; PMHX: PTSD, ETOH, szs, , L TKA 2017  Clinical Impression  Pt is s/p THA resulting in the deficits listed below (see PT Problem List).  Pt doing well, amb ~ 42' with RW, distance limited by PT. Continue to follow in acute setting.   Pt will benefit from skilled PT to increase their independence and safety with mobility to allow discharge to the venue listed below.      Follow Up Recommendations Follow surgeon's recommendation for DC plan and follow-up therapies    Equipment Recommendations  None recommended by PT    Recommendations for Other Services       Precautions / Restrictions Restrictions Weight Bearing Restrictions: No Other Position/Activity Restrictions: WBAT      Mobility  Bed Mobility Overal bed mobility: Needs Assistance Bed Mobility: Supine to Sit     Supine to sit: Min guard     General bed mobility comments: for safety  Transfers Overall transfer level: Needs assistance Equipment used: Rolling walker (2 wheeled) Transfers: Sit to/from Stand Sit to Stand: Min guard         General transfer comment: cues for hand placement  Ambulation/Gait Ambulation/Gait assistance: Min guard Gait Distance (Feet): 80 Feet Assistive device: Rolling walker (2 wheeled) Gait Pattern/deviations: Step-to pattern;Step-through pattern;Decreased stride length     General Gait Details: cues for initial sequence  Stairs            Wheelchair Mobility    Modified Rankin (Stroke Patients Only)       Balance                                             Pertinent Vitals/Pain Pain Assessment: 0-10 Pain Score: 3  Pain Location: right hip Pain Descriptors / Indicators: Grimacing;Guarding Pain Intervention(s): Limited activity within patient's tolerance;Monitored during  session;Premedicated before session;Repositioned    Home Living Family/patient expects to be discharged to:: Private residence Living Arrangements: Spouse/significant other Available Help at Discharge: Family Type of Home: House Home Access: Stairs to enter   Technical brewer of Steps: 2 Home Layout: One level Home Equipment: Environmental consultant - 2 wheels;Bedside commode      Prior Function Level of Independence: Independent         Comments: 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, broadcasting Pt's son is a PT and works for [employer]        Extremity/Trunk Assessment   Upper Extremity Assessment Upper Extremity Assessment: Overall WFL for tasks assessed    Lower Extremity Assessment Lower Extremity Assessment: RLE deficits/detail RLE Deficits / Details: ankle WFL, knee and hip AAROM gorssly WFL, strength 2+ to 3/5       Communication   Communication: No difficulties  Cognition Arousal/Alertness: Awake/alert Behavior During Therapy: WFL for tasks assessed/performed Overall Cognitive Status: Within Functional Limits for tasks assessed                                        General Comments      Exercises     Assessment/Plan    PT Assessment Patient needs continued PT services  PT Problem  List Decreased strength;Decreased activity tolerance;Decreased mobility;Pain;Decreased knowledge of use of DME       PT Treatment Interventions Stair training;Gait training;DME instruction;Functional mobility training;Therapeutic activities;Therapeutic exercise;Patient/family education    PT Goals (Current goals can be found in the Care Plan section)  Acute Rehab PT Goals Patient Stated Goal: less hip pain PT Goal Formulation: With patient Time For Goal Achievement: 12/09/18 Potential to Achieve Goals: Good    Frequency 7X/week   Barriers to discharge        Co-evaluation               AM-PAC PT "6 Clicks" Mobility   Outcome Measure Help needed turning from your back to your side while in a flat bed without using bedrails?: A Little Help needed moving from lying on your back to sitting on the side of a flat bed without using bedrails?: A Little Help needed moving to and from a bed to a chair (including a wheelchair)?: A Little Help needed standing up from a chair using your arms (e.g., wheelchair or bedside chair)?: A Little Help needed to walk in hospital room?: A Little Help needed climbing 3-5 steps with a railing? : A Little 6 Click Score: 18    End of Session Equipment Utilized During Treatment: Gait belt Activity Tolerance: Patient tolerated treatment well Patient left: in chair;with call bell/phone within reach;with chair alarm set Nurse Communication: Mobility status PT Visit Diagnosis: Difficulty in walking, not elsewhere classified (R26.2)    Time: 5916-3846 PT Time Calculation (min) (ACUTE ONLY): 15 min   Charges:   PT Evaluation $PT Eval Low Complexity: 1 Low          Kenyon Ana, PT  Pager: [phone] Acute Rehab Dept [facility]): [phone]   12/02/2018   [facility] 12/02/2018, 6:34 PM

## 2018-12-02 NOTE — Anesthesia Preprocedure Evaluation (Addendum)
Anesthesia Evaluation  Patient identified by MRN, date of birth, ID band Patient awake    Reviewed: Allergy & Precautions, NPO status , Patient's Chart, lab work & pertinent test results  History of Anesthesia Complications Negative for: history of anesthetic complications  Airway Mallampati: II  TM Distance: >3 FB Neck ROM: Full    Dental  (+) Dental Advisory Given, Partial Upper   Pulmonary former smoker,    breath sounds clear to auscultation       Cardiovascular Exercise Tolerance: Good hypertension, Pt. on medications  Rhythm:Regular Rate:Normal     Neuro/Psych Seizures -,  PSYCHIATRIC DISORDERS Anxiety Depression    GI/Hepatic GERD  Controlled,(+)     substance abuse (hx EtOH abuse)  ,   Endo/Other   Obesity   Renal/GU negative Renal ROS     Musculoskeletal  (+) Arthritis ,   Abdominal   Peds  Hematology negative hematology ROS (+)   Anesthesia Other Findings   Reproductive/Obstetrics                            Anesthesia Physical Anesthesia Plan  ASA: II  Anesthesia Plan: Spinal   Post-op Pain Management:    Induction:   PONV Risk Score and Plan: 1 and Treatment may vary due to age or medical condition and Propofol infusion  Airway Management Planned: Natural Airway and Simple Face Mask  Additional Equipment: None  Intra-op Plan:   Post-operative Plan:   Informed Consent: I have reviewed the patients History and Physical, chart, labs and discussed the procedure including the risks, benefits and alternatives for the proposed anesthesia with the patient or authorized representative who has indicated his/her understanding and acceptance.       Plan Discussed with: CRNA and Anesthesiologist  Anesthesia Plan Comments: (Labs reviewed, platelets acceptable. Discussed risks and benefits of spinal, including spinal/epidural hematoma, infection, failed block, and  PDPH. Patient expressed understanding and wished to proceed. )       Anesthesia Quick Evaluation

## 2018-12-02 NOTE — Anesthesia Procedure Notes (Signed)
Spinal  Patient location during procedure: OR Start time: 12/02/2018 9:41 AM End time: 12/02/2018 9:45 AM Staffing Anesthesiologist: Audry Pili, MD Performed: anesthesiologist  Preanesthetic Checklist Completed: patient identified, surgical consent, pre-op evaluation, timeout performed, IV checked, risks and benefits discussed and monitors and equipment checked Spinal Block Patient position: sitting Prep: DuraPrep Patient monitoring: heart rate, cardiac monitor, continuous pulse ox and blood pressure Approach: midline Location: L3-4 Injection technique: single-shot Needle Needle type: Pencan  Needle gauge: 24 G Additional Notes Consent was obtained prior to the procedure with all questions answered and concerns addressed. Risks including, but not limited to, bleeding, infection, nerve damage, paralysis, failed block, inadequate analgesia, allergic reaction, high spinal, itching, and headache were discussed and the patient wished to proceed. Functioning IV was confirmed and monitors were applied. Sterile prep and drape, including hand hygiene, mask, and sterile gloves were used. The patient was positioned and the spine was prepped. The skin was anesthetized with lidocaine. Free flow of clear CSF was obtained prior to injecting local anesthetic into the CSF. The spinal needle aspirated freely following injection. The needle was carefully withdrawn. The patient tolerated the procedure well.   Renold Don, MD

## 2018-12-02 NOTE — Discharge Instructions (Signed)

## 2018-12-02 NOTE — Transfer of Care (Signed)
Immediate Anesthesia Transfer of Care Note  Patient: Jackson Sherman  Procedure(s) Performed: TOTAL HIP ARTHROPLASTY ANTERIOR APPROACH (Right Hip)  Patient Location: PACU  Anesthesia Type:Spinal  Level of Consciousness: awake, alert  and oriented  Airway & Oxygen Therapy: Patient Spontanous Breathing and Patient connected to face mask oxygen  Post-op Assessment: Report given to RN and Post -op Vital signs reviewed and stable  Post vital signs: Reviewed and stable  Last Vitals:  Vitals Value Taken Time  BP 104/68 12/02/2018 11:52 AM  Temp    Pulse 74 12/02/2018 11:54 AM  Resp 15 12/02/2018 11:54 AM  SpO2 100 % 12/02/2018 11:54 AM  Vitals shown include unvalidated device data.  Last Pain:  Vitals:   12/02/18 0716  TempSrc: Oral  PainSc: 0-No pain      Patients Stated Pain Goal: 3 (30/09/79 4997)  Complications: No apparent anesthesia complications

## 2018-12-03 DIAGNOSIS — M1611 Unilateral primary osteoarthritis, right hip: Secondary | ICD-10-CM | POA: Diagnosis not present

## 2018-12-03 LAB — CBC
HCT: 40.9 % (ref 39.0–52.0)
Hemoglobin: 12.7 g/dL — ABNORMAL LOW (ref 13.0–17.0)
MCH: 28.1 pg (ref 26.0–34.0)
MCHC: 31.1 g/dL (ref 30.0–36.0)
MCV: 90.5 fL (ref 80.0–100.0)
Platelets: 208 10*3/uL (ref 150–400)
RBC: 4.52 MIL/uL (ref 4.22–5.81)
RDW: 13.5 % (ref 11.5–15.5)
WBC: 16.8 10*3/uL — ABNORMAL HIGH (ref 4.0–10.5)
nRBC: 0 % (ref 0.0–0.2)

## 2018-12-03 MED ORDER — DOCUSATE SODIUM 100 MG PO CAPS: 100.0000 mg | ORAL_CAPSULE | Freq: Two times a day (BID) | ORAL | 0 refills | Status: DC

## 2018-12-03 MED ORDER — ASPIRIN EC 325 MG PO TBEC: 325.0000 mg | DELAYED_RELEASE_TABLET | Freq: Two times a day (BID) | ORAL | 0 refills | Status: DC

## 2018-12-03 MED ORDER — TIZANIDINE HCL 2 MG PO TABS: 2.0000 mg | ORAL_TABLET | Freq: Three times a day (TID) | ORAL | 0 refills | Status: DC | PRN

## 2018-12-03 MED ORDER — OXYCODONE-ACETAMINOPHEN 5-325 MG PO TABS: 1.0000 | ORAL_TABLET | Freq: Four times a day (QID) | ORAL | 0 refills | Status: DC | PRN

## 2018-12-03 NOTE — TOC Initial Note (Signed)
Transition of Care [facility]) - Initial/Assessment Note    Patient Details  Name: Jackson Sherman MRN: [id number] Date of Birth: April 09, 1961  Transition of Care (TOC) CM/SW Contact:    Joaquin Courts, RN Phone Number: 12/03/2018, 12:37 PM  Clinical Narrative:   CM spoke with patient at bedside, who reports he has rolling walker and 3-in-1 at home and his wife, Rebekah [place], is available to assist him as needed.  [facility] with [facility] for HHPT.                 Expected Discharge Plan: [place] Barriers to Discharge: No Barriers Identified   Patient Goals and CMS Choice Patient states their goals for this hospitalization and ongoing recovery are:: to get better      Expected Discharge Plan and Services Expected Discharge Plan: [place]   Discharge Planning Services: CM Consult   Living arrangements for the past 2 months: Single Family Home Expected Discharge Date: 12/03/18               DME Arranged: N/A DME Agency: NA       HH Arranged: PT HH Agency: [place] ([place]) Date HH Agency Contacted: 12/03/18 [place]: 7 Representative spoke with at [place]: [place] Arrangements/Services Living arrangements for the past 2 months: [place] with:: Spouse Patient language and need for interpreter reviewed:: Yes Do you feel safe going back to the place where you live?: Yes      Need for Family Participation in Patient Care: Yes (Comment) Care giver support system in place?: Yes (comment)   Criminal Activity/Legal Involvement Pertinent to Current Situation/Hospitalization: No - Comment as needed  Activities of Daily Living Home Assistive Devices/Equipment: Walker (specify type), Cane (specify quad or straight), Blood pressure cuff, Eyeglasses, Shower chair with back, Hand-held shower hose, Dentures (specify type) ADL Screening (condition at time of  admission) Patient's cognitive ability adequate to safely complete daily activities?: Yes Is the patient deaf or have difficulty hearing?: No Does the patient have difficulty seeing, even when wearing glasses/contacts?: No Does the patient have difficulty concentrating, remembering, or making decisions?: No Patient able to express need for assistance with ADLs?: Yes Does the patient have difficulty dressing or bathing?: No Independently performs ADLs?: Yes (appropriate for developmental age) Does the patient have difficulty walking or climbing stairs?: No Weakness of Legs: None Weakness of Arms/Hands: None  Permission Sought/Granted Permission sought to share information with : Family Supports Permission granted to share information with : Yes, Verbal Permission Granted  Share Information with NAME: Gwyn     Permission granted to share info w Relationship: Spouse     Emotional Assessment Appearance:: Appears stated age Attitude/Demeanor/Rapport: Engaged Affect (typically observed): Accepting Orientation: : Oriented to Self, Oriented to Place, Oriented to  Time, Oriented to Situation   Psych Involvement: No (comment)  Admission diagnosis:  RIGHT TOTAL HIP ARTHROPLASTY Patient Active Problem List   Diagnosis Date Noted  . Primary osteoarthritis of right hip 12/02/2018  . Vitamin D deficiency 09/29/2018  . Insulin resistance 09/29/2018  . Prediabetes 08/31/2018  . History of seizure 08/31/2018  . Right hip pain 08/04/2018  . Chronic left shoulder pain 12/22/2016  . 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 Chronic pain of left knee 04/09/2016  . Erectile dysfunction 03/16/2016  . PTSD (post-traumatic stress disorder) 03/16/2016  . Altered mental status 02/07/2016  . Seizure ([place])   .  Alcohol abuse   . Elevated uric acid in blood 09/25/2015  . Depression 08/22/2012  . Insomnia 05/06/2010  . Hyperlipidemia 04/06/2006  . OBESITY, NOS 04/06/2006  . ALCOHOL DEPENDENCE  04/06/2006  . Essential hypertension 04/06/2006   PCP:  [name], MD Pharmacy:   [place], [place] [address] [place] [address] Phone: [phone] Fax: [phone]  [facility] [address] [place], [place] - [address] [place] [address] Phone: [phone] Fax: [phone]     Social Determinants of Health (SDOH) Interventions    Readmission Risk Interventions No flowsheet data found.

## 2018-12-03 NOTE — Discharge Summary (Signed)
Patient ID: Jackson Sherman MRN: [id number] DOB/AGE: Nov 07, 1960 58 y.o.  Admit date: 12/02/2018 Discharge date: 12/03/2018  Admission Diagnoses:  Principal Problem:   Primary osteoarthritis of right hip   Discharge Diagnoses:  Same  Past Medical History:  Diagnosis Date  . 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 Anxiety   . Depression   . GERD (gastroesophageal reflux disease)   . Hemorrhoids   . Hip arthritis   . Hyperlipidemia   . Hypertension   . Joint pain   . Osteoarthritis   . Seizures ([place])    ?? in August ...more related to ETOH rehab    Surgeries: Procedure(s): TOTAL HIP ARTHROPLASTY ANTERIOR APPROACH on 12/02/2018   Consultants:   Discharged Condition: Improved  Hospital Course: [name] is an 58 y.o. male who was admitted 12/02/2018 for operative treatment ofPrimary osteoarthritis of right hip. Patient has severe unremitting pain that affects sleep, daily activities, and work/hobbies. After pre-op clearance the patient was taken to the operating room on 12/02/2018 and underwent  Procedure(s): TOTAL HIP ARTHROPLASTY ANTERIOR APPROACH.    Patient was given perioperative antibiotics:  Anti-infectives (From admission, onward)   Start     Dose/Rate Route Frequency Ordered Stop   12/02/18 1600  ceFAZolin (ANCEF) IVPB 2g/100 mL premix     2 g 200 mL/hr over 30 Minutes Intravenous Every 6 hours 12/02/18 1344 12/02/18 2137   12/02/18 0730  ceFAZolin (ANCEF) IVPB 2g/100 mL premix     2 g 200 mL/hr over 30 Minutes Intravenous On call to O.R. 12/02/18 2703 12/02/18 0946       Patient was given sequential compression devices, early ambulation, and chemoprophylaxis to prevent DVT.  Patient benefited maximally from hospital stay and there were no complications.    Recent vital signs:  Patient Vitals for the past 24 hrs:  BP Temp Temp src Pulse Resp SpO2  12/03/18 0506 122/72 (!) 97.5 F (36.4 C) - (!) 57 18 94 %  12/03/18 0108 128/90 98 F (36.7 C) - 67  18 93 %  12/02/18 2141 138/80 97.9 F (36.6 C) - 68 18 96 %  12/02/18 1706 (!) 140/91 97.8 F (36.6 C) Oral 67 16 96 %  12/02/18 1548 131/75 97.7 F (36.5 C) Oral 62 16 97 %  12/02/18 1448 124/78 (!) 97.5 F (36.4 C) Oral (!) 57 16 100 %  12/02/18 1332 127/74 97.6 F (36.4 C) Oral (!) 56 14 100 %  12/02/18 1315 114/79 97.6 F (36.4 C) - (!) 58 16 97 %  12/02/18 1300 114/75 - - (!) 56 18 97 %  12/02/18 1245 113/72 - - (!) 58 20 98 %  12/02/18 1230 114/75 - - (!) 58 20 97 %  12/02/18 1215 122/72 - - 63 15 98 %  12/02/18 1200 123/67 - - (!) 58 17 100 %  12/02/18 1152 104/68 97.8 F (36.6 C) - 73 (!) 25 100 %     Recent laboratory studies:  Recent Labs    12/03/18 0408  WBC 16.8*  HGB 12.7*  HCT 40.9  PLT 208     Discharge Medications:   Allergies as of 12/03/2018   No Known Allergies     Medication List    STOP taking these medications   Aleve 220 MG tablet Generic drug:  naproxen sodium     TAKE these medications   aspirin EC 325 MG tablet Take 1 tablet (325 mg total) by mouth 2 (two) times daily after a meal. Take  x 1 month post op to decrease risk of blood clots.   atorvastatin 20 MG tablet Commonly known as:  LIPITOR Take 20 mg by mouth at bedtime What changed:    how much to take  how to take this  when to take this   diphenhydrAMINE 25 MG tablet Commonly known as:  BENADRYL Take 25 mg by mouth at bedtime as needed for sleep.   docusate sodium 100 MG capsule Commonly known as:  Colace Take 1 capsule (100 mg total) by mouth 2 (two) times daily.   escitalopram 20 MG tablet Commonly known as:  LEXAPRO Take 1 tablet by mouth once daily   loratadine 10 MG tablet Commonly known as:  CLARITIN Take 10 mg by mouth daily.   losartan 50 MG tablet Commonly known as:  COZAAR Take 1 tablet (50 mg total) by mouth daily.   oxyCODONE-acetaminophen 5-325 MG tablet Commonly known as:  PERCOCET/ROXICET Take 1-2 tablets by mouth every 6 (six) hours as  needed for severe pain.   sildenafil 20 MG tablet Commonly known as:  REVATIO Take 2-5 tablets (40-100 mg total) by mouth daily as needed.   tiZANidine 2 MG tablet Commonly known as:  ZANAFLEX Take 1 tablet (2 mg total) by mouth every 8 (eight) hours as needed for muscle spasms.   Vitamin D (Ergocalciferol) 1.25 MG (50000 UT) Caps capsule Commonly known as:  DRISDOL Take 1 capsule (50,000 Units total) by mouth every 7 (seven) days.       Diagnostic Studies: Dg Chest 2 View  Result Date: 11/24/2018 CLINICAL DATA:  Preop for right hip replacement.  Hypertension. EXAM: CHEST - 2 VIEW COMPARISON:  Radiographs of February 24, 2016. FINDINGS: The heart size and mediastinal contours are within normal limits. Both lungs are clear. The visualized skeletal structures are unremarkable. IMPRESSION: No active cardiopulmonary disease. Electronically Signed   By: Marijo Conception M.D.   On: 11/24/2018 09:16   Dg C-arm 1-60 Min-no Report  Result Date: 12/02/2018 Fluoroscopy was utilized by the requesting physician.  No radiographic interpretation.   Dg Hip Operative Unilat W Or W/o Pelvis Right  Result Date: 12/02/2018 CLINICAL DATA:  RIGHT hip replacement EXAM: OPERATIVE RIGHT HIP (WITH PELVIS IF PERFORMED) 2 VIEWS TECHNIQUE: Fluoroscopic spot image(s) were submitted for interpretation post-operatively. COMPARISON:  None FLUOROSCOPY TIME:  0 minutes 36 seconds Images obtained: 2 Dose: 5.04 mGy FINDINGS: Acetabular and femoral components of a hip prosthesis are identified. No fracture or dislocation seen. IMPRESSION: RIGHT hip prosthesis without acute radiographic complication. Electronically Signed   By: Lavonia Dana M.D.   On: 12/02/2018 11:36    Disposition: Discharge disposition: 01-Home or Self Care       Discharge Instructions    Call MD / Call 911   Complete by:  As directed    If you experience chest pain or shortness of breath, CALL 911 and be transported to the hospital emergency room.   If you develope a fever above 101 F, pus (white drainage) or increased drainage or redness at the wound, or calf pain, call your surgeon's office.   Constipation Prevention   Complete by:  As directed    Drink plenty of fluids.  Prune juice may be helpful.  You may use a stool softener, such as Colace (over the counter) 100 mg twice a day.  Use MiraLax (over the counter) for constipation as needed.   Diet - low sodium heart healthy   Complete by:  As directed  Discharge instructions   Complete by:  As directed    INSTRUCTIONS AFTER JOINT REPLACEMENT   Remove items at home which could result in a fall. This includes throw rugs or furniture in walking pathways ICE to the affected joint every three hours while awake for 30 minutes at a time, for at least the first 3-5 days, and then as needed for pain and swelling.  Continue to use ice for pain and swelling. You may notice swelling that will progress down to the foot and ankle.  This is normal after surgery.  Elevate your leg when you are not up walking on it.   Continue to use the breathing machine you got in the hospital (incentive spirometer) which will help keep your temperature down.  It is common for your temperature to cycle up and down following surgery, especially at night when you are not up moving around and exerting yourself.  The breathing machine keeps your lungs expanded and your temperature down.   DIET:  As you were doing prior to hospitalization, we recommend a well-balanced diet.  DRESSING / WOUND CARE / SHOWERING  You may shower 3 days after surgery, but keep the wounds dry during showering.  You may use an occlusive plastic wrap (Press'n Seal for example), NO SOAKING/SUBMERGING IN THE BATHTUB.  If the bandage gets wet, change with a clean dry gauze.  If the incision gets wet, pat the wound dry with a clean towel.  ACTIVITY  Increase activity slowly as tolerated, but follow the weight bearing instructions below.   No  driving for 6 weeks or until further direction given by your physician.  You cannot drive while taking narcotics.  No lifting or carrying greater than 10 lbs. until further directed by your surgeon. Avoid periods of inactivity such as sitting longer than an hour when not asleep. This helps prevent blood clots.  You may return to work once you are authorized by your doctor.     WEIGHT BEARING   Weight bearing as tolerated with assist device (walker, cane, etc) as directed, use it as long as suggested by your surgeon or therapist, typically at least 4-6 weeks.   EXERCISES  Results after joint replacement surgery are often greatly improved when you follow the exercise, range of motion and muscle strengthening exercises prescribed by your doctor. Safety measures are also important to protect the joint from further injury. Any time any of these exercises cause you to have increased pain or swelling, decrease what you are doing until you are comfortable again and then slowly increase them. If you have problems or questions, call your caregiver or physical therapist for advice.   Rehabilitation is important following a joint replacement. After just a few days of immobilization, the muscles of the leg can become weakened and shrink (atrophy).  These exercises are designed to build up the tone and strength of the thigh and leg muscles and to improve motion. Often times heat used for twenty to thirty minutes before working out will loosen up your tissues and help with improving the range of motion but do not use heat for the first two weeks following surgery (sometimes heat can increase post-operative swelling).   These exercises can be done on a training (exercise) mat, on the floor, on a table or on a bed. Use whatever works the best and is most comfortable for you.    Use music or television while you are exercising so that the exercises are a pleasant break in  your day. This will make your life better  with the exercises acting as a break in your routine that you can look forward to.   Perform all exercises about fifteen times, three times per day or as directed.  You should exercise both the operative leg and the other leg as well.   Exercises include:   Quad Sets - Tighten up the muscle on the front of the thigh (Quad) and hold for 5-10 seconds.   Straight Leg Raises - With your knee straight (if you were given a brace, keep it on), lift the leg to 60 degrees, hold for 3 seconds, and slowly lower the leg.  Perform this exercise against resistance later as your leg gets stronger.  Leg Slides: Lying on your back, slowly slide your foot toward your buttocks, bending your knee up off the floor (only go as far as is comfortable). Then slowly slide your foot back down until your leg is flat on the floor again.  Angel Wings: Lying on your back spread your legs to the side as far apart as you can without causing discomfort.  Hamstring Strength:  Lying on your back, push your heel against the floor with your leg straight by tightening up the muscles of your buttocks.  Repeat, but this time bend your knee to a comfortable angle, and push your heel against the floor.  You may put a pillow under the heel to make it more comfortable if necessary.   A rehabilitation program following joint replacement surgery can speed recovery and prevent re-injury in the future due to weakened muscles. Contact your doctor or a physical therapist for more information on knee rehabilitation.    CONSTIPATION  Constipation is defined medically as fewer than three stools per week and severe constipation as less than one stool per week.  Even if you have a regular bowel pattern at home, your normal regimen is likely to be disrupted due to multiple reasons following surgery.  Combination of anesthesia, postoperative narcotics, change in appetite and fluid intake all can affect your bowels.   YOU MUST use at least one of the  following options; they are listed in order of increasing strength to get the job done.  They are all available over the counter, and you may need to use some, POSSIBLY even all of these options:    Drink plenty of fluids (prune juice may be helpful) and high fiber foods Colace 100 mg by mouth twice a day  Senokot for constipation as directed and as needed Dulcolax (bisacodyl), take with full glass of water  Miralax (polyethylene glycol) once or twice a day as needed.  If you have tried all these things and are unable to have a bowel movement in the first 3-4 days after surgery call either your surgeon or your primary doctor.    If you experience loose stools or diarrhea, hold the medications until you stool forms back up.  If your symptoms do not get better within 1 week or if they get worse, check with your doctor.  If you experience "the worst abdominal pain ever" or develop nausea or vomiting, please contact the office immediately for further recommendations for treatment.   ITCHING:  If you experience itching with your medications, try taking only a single pain pill, or even half a pain pill at a time.  You can also use Benadryl over the counter for itching or also to help with sleep.   TED HOSE STOCKINGS:  Use stockings  on both legs until for at least 2 weeks or as directed by physician office. They may be removed at night for sleeping.  MEDICATIONS:  See your medication summary on the "After Visit Summary" that nursing will review with you.  You may have some home medications which will be placed on hold until you complete the course of blood thinner medication.  It is important for you to complete the blood thinner medication as prescribed.  PRECAUTIONS:  If you experience chest pain or shortness of breath - call 911 immediately for transfer to the hospital emergency department.   If you develop a fever greater that 101 F, purulent drainage from wound, increased redness or drainage from  wound, foul odor from the wound/dressing, or calf pain - CONTACT YOUR SURGEON.                                                   FOLLOW-UP APPOINTMENTS:  If you do not already have a post-op appointment, please call the office for an appointment to be seen by your surgeon.  Guidelines for how soon to be seen are listed in your "After Visit Summary", but are typically between 1-4 weeks after surgery.  OTHER INSTRUCTIONS:   Knee Replacement:  Do not place pillow under knee, focus on keeping the knee straight while resting. CPM instructions: 0-90 degrees, 2 hours in the morning, 2 hours in the afternoon, and 2 hours in the evening. Place foam block, curve side up under heel at all times except when in CPM or when walking.  DO NOT modify, tear, cut, or change the foam block in any way.  MAKE SURE YOU:  Understand these instructions.  Get help right away if you are not doing well or get worse.    Thank you for letting us be a part of your medical care team.  It is a privilege we respect greatly.  We hope these instructions will help you stay on track for a fast and full recovery!   Increase activity slowly as tolerated   Complete by:  As directed       Follow-up Information    Dorna Leitz, MD. Schedule an appointment as soon as possible for a visit in 2 weeks.   Specialty:  Orthopedic Surgery Contact information: [place] [address] [phone]            Signed: Larwance Sachs Felice Deem 12/03/2018, 7:36 AM

## 2018-12-03 NOTE — Progress Notes (Signed)
Physical Therapy Treatment Patient Details Name: Jackson Sherman MRN: [id number] DOB: 1961-02-26 Today's Date: 12/03/2018    History of Present Illness s/p R DA THA; PMHX: PTSD, ETOH, szs, , L TKA 2017    PT Comments    Mr. Wach made excellent progress with therapy today. He demonstrated good carry over with gait using RW and demonstrated Mod I level for sit<>stand transfer from EOB and toilet. Patient ambulated ~200 feet to rehab gym and ascended/descended 3 stairs with 1 handrail and supervision. He was instructed on safe sequencing of stairs with "up with Lt/down with Rt" and was educated on reverse step up to negotiate threshold at doorway. He demonstrated good balance while managing RW to bring it up to the landing after reverse step up. Pt denied significant increase in hip pain at EOS and demonstrated understanding of exercises for HEP. Ice packs were applied to patient's hip at EOS. Patient demonstrated safe functional mobility today and is ready for discharge home per therapy. Anticipate he will be discharged when medically ready and pian managed adequately.    Follow Up Recommendations  Follow surgeon's recommendation for DC plan and follow-up therapies     Equipment Recommendations  None recommended by PT    Recommendations for Other Services       Precautions / Restrictions Restrictions Weight Bearing Restrictions: No Other Position/Activity Restrictions: WBAT    Mobility  Bed Mobility Overal bed mobility: Modified Independent Bed Mobility: Supine to Sit     Supine to sit: Modified independent (Device/Increase time)     General bed mobility comments: HOB slightly elevated  Transfers Overall transfer level: Needs assistance Equipment used: Rolling walker (2 wheeled) Transfers: Sit to/from Stand Sit to Stand: Modified independent (Device/Increase time);Supervision         General transfer comment: pt with good carryover, no cues need for use of  RW  Ambulation/Gait Ambulation/Gait assistance: Supervision Gait Distance (Feet): 200 Feet Assistive device: Rolling walker (2 wheeled) Gait Pattern/deviations: Step-to pattern;Step-through pattern;Decreased stride length;Decreased stance time - right Gait velocity: decreased   General Gait Details: pt with good step through pattern, cues to keep [place] closer proximety   Stairs Stairs: Yes Stairs assistance: Supervision Stair Management: No rails;Forwards;Backwards;Step to pattern;One rail Left Number of Stairs: 3 General stair comments: ascend/descend 3 stairs with step to pattern (up with Lt/down with Rt), pt with safe sequencing use of 1 hand rail for stairs; pt perforemd reverse step up with RW to negotiate threshhold or curb and demonstrated good balance to lift RW up to platform   Wheelchair Mobility    Modified Rankin (Stroke Patients Only)       Balance Overall balance assessment: Needs assistance Sitting-balance support: No upper extremity supported;Feet supported Sitting balance-Leahy Scale: Good     Standing balance support: During functional activity Standing balance-Leahy Scale: Good Standing balance comment: pt able to maintain standing balance and lift RW up to 6" platform from floor for reverse step ups        Cognition Arousal/Alertness: Awake/alert Behavior During Therapy: WFL for tasks assessed/performed Overall Cognitive Status: Within Functional Limits for tasks assessed         Exercises Total Joint Exercises Ankle Circles/Pumps: Both;10 reps(5 sec holds) Long Arc Quad: AROM;Right;10 reps(5 sec holds)    General Comments        Pertinent Vitals/Pain Pain Assessment: 0-10 Pain Score: 2  Pain Location: right hip Pain Descriptors / Indicators: Sore Pain Intervention(s): Limited activity within patient's tolerance;Monitored during session  PT Goals (current goals can now be found in the care plan section) Acute Rehab PT  Goals Patient Stated Goal: less hip pain PT Goal Formulation: With patient Time For Goal Achievement: 12/09/18 Potential to Achieve Goals: Good Progress towards PT goals: Progressing toward goals    Frequency    7X/week      PT Plan Current plan remains appropriate       AM-PAC PT "6 Clicks" Mobility   Outcome Measure  Help needed turning from your back to your side while in a flat bed without using bedrails?: None Help needed moving from lying on your back to sitting on the side of a flat bed without using bedrails?: A Little Help needed moving to and from a bed to a chair (including a wheelchair)?: A Little Help needed standing up from a chair using your arms (e.g., wheelchair or bedside chair)?: A Little Help needed to walk in hospital room?: A Little Help needed climbing 3-5 steps with a railing? : A Little 6 Click Score: 19    End of Session Equipment Utilized During Treatment: Gait belt Activity Tolerance: Patient tolerated treatment well Patient left: in chair;with call bell/phone within reach Nurse Communication: Mobility status PT Visit Diagnosis: Difficulty in walking, not elsewhere classified (R26.2)     Time: 0086-7619 PT Time Calculation (min) (ACUTE ONLY): 28 min  Charges:  $Gait Training: 23-37 mins                     Kipp Brood, PT, DPT, [facility] Physical Therapist with [facility]  12/03/2018 12:47 PM

## 2018-12-03 NOTE — Progress Notes (Signed)
Subjective: 1 Day Post-Op Procedure(s) (LRB): TOTAL HIP ARTHROPLASTY ANTERIOR APPROACH (Right)   Patient doing well and is looking forward to going home.  Activity level:  wbat Diet tolerance:  ok Voiding:  ok Patient reports pain as mild.    Objective: Vital signs in last 24 hours: Temp:  [97.5 F (36.4 C)-98 F (36.7 C)] 97.5 F (36.4 C) (06/06 0506) Pulse Rate:  [56-73] 57 (06/06 0506) Resp:  [14-25] 18 (06/06 0506) BP: (104-140)/(67-91) 122/72 (06/06 0506) SpO2:  [93 %-100 %] 94 % (06/06 0506)  Labs: Recent Labs    12/03/18 0408  HGB 12.7*   Recent Labs    12/03/18 0408  WBC 16.8*  RBC 4.52  HCT 40.9  PLT 208   No results for input(s): NA, K, CL, CO2, BUN, CREATININE, GLUCOSE, CALCIUM in the last 72 hours. No results for input(s): LABPT, INR in the last 72 hours.  Physical Exam:  Neurologically intact ABD soft Neurovascular intact Sensation intact distally Intact pulses distally Dorsiflexion/Plantar flexion intact Incision: dressing C/D/I and no drainage No cellulitis present Compartment soft  Assessment/Plan:  1 Day Post-Op Procedure(s) (LRB): TOTAL HIP ARTHROPLASTY ANTERIOR APPROACH (Right) Advance diet Up with therapy Discharge home with home health today after PT if doing well. Continue on ASA 325mg  BID for DVT prevention.  Follow up in office 2 weeks post op   Jackson Sherman 12/03/2018, 7:38 AM

## 2018-12-05 ENCOUNTER — Other Ambulatory Visit: Payer: Self-pay | Admitting: *Deleted

## 2018-12-05 ENCOUNTER — Encounter: Payer: Self-pay | Admitting: *Deleted

## 2018-12-05 MED FILL — OXYCODONE-ACETAMINOPHEN 5-3: 5-325 | 4 days supply | Qty: 30 | Fill #0

## 2018-12-05 NOTE — Patient Outreach (Signed)
[facility]) Care Management  12/05/2018  Jackson Sherman 09-28-1960 [id number]  Transition of care call   Referral received: 11/25/18 Initial outreach: 11/30/18 for preoperative call Initial post hospital discharge outreach: 12/05/18 Insurance: [place]   Subjective: Initial successful telephone call to patient's preferred number in order to complete transition of care assessment; 2 HIPAA identifiers verified. Explained purpose of call and completed transition of care assessment.  Jackson Sherman states he is doing well, denies post-operative problems, says surgical incisions are unremarkable, states surgical pain well managed with prescribed medications, tolerating diet, denies bowel or bladder problems.  Spouse is assisting with his recovery.  Jackson Sherman says he was told by his acute case manager that he would be receiving home health services for physical therapy but he says he has not received a call from the agency yet.    Objective:  Jackson Sherman was hospitalized at [facility] from 6/5-12/04/2018 for right hip arthroplasty. Comorbidities include: osteoarthritis of the right hip, HTN, insulin resistance, depression, hyperlipidemia, post traumatic stress disorder, s/p total knee replacement, seizure and Vitamin D deficiency He was discharged to home on 12/04/18 with home health physical therapy to be provided by [facility], [facility] [place].    Assessment:  Patient voices good understanding of all discharge instructions.  See transition of care flowsheet for assessment details.   Plan:  Reviewed hospital discharge diagnosis of right total hip arthroplasty and treatment plan using hospital discharge instructions, assessing medication adherence,  reviewing postoperative problems requiring provider notification, and discussing the importance of follow up with his surgeon. Securely e-mailed Jackson Sherman's [employer] 2020 Wellness  Requirements of: Completing the computerized Health Assessment and the Health Action Step with [employer] [facility]) by February 28 2019 AND have an annual physical between June 29, 2017 and February 28, 2019.  Reviewed [place]'s chronic disease management program benefit with [employer] and encouraged him to contact them at [phone] or at [email or web address] to enroll or for questions about managing his/her chronic disease states. No ongoing care management needs identified so will close case to [place] [facility] care management services and route successful outreach letter with [place] Management pamphlet and 24 Hour Nurse Line Magnet to [place] [facility] clinical pool to be mailed to patient's home address.   Barrington Ellison RN,CCM,CDE [place] Management Coordinator Office Phone [phone] Office Fax [phone]

## 2018-12-08 ENCOUNTER — Encounter (INDEPENDENT_AMBULATORY_CARE_PROVIDER_SITE_OTHER): Payer: Self-pay | Admitting: Bariatrics

## 2018-12-08 ENCOUNTER — Ambulatory Visit (INDEPENDENT_AMBULATORY_CARE_PROVIDER_SITE_OTHER): Payer: No Typology Code available for payment source | Admitting: Bariatrics

## 2018-12-08 ENCOUNTER — Other Ambulatory Visit: Payer: Self-pay

## 2018-12-08 DIAGNOSIS — E559 Vitamin D deficiency, unspecified: Secondary | ICD-10-CM

## 2018-12-08 DIAGNOSIS — I10 Essential (primary) hypertension: Secondary | ICD-10-CM | POA: Diagnosis not present

## 2018-12-08 DIAGNOSIS — Z6837 Body mass index (BMI) 37.0-37.9, adult: Secondary | ICD-10-CM

## 2018-12-08 MED ORDER — VITAMIN D (ERGOCALCIFEROL) 1.25 MG (50000 UNIT) PO CAPS: 50000.0000 [IU] | ORAL_CAPSULE | ORAL | 0 refills | Status: DC

## 2018-12-08 MED FILL — VIT D2 1.25 MG (50,000 UNIT: 1.25 MG | 28 days supply | Qty: 4 | Fill #0

## 2018-12-11 NOTE — Anesthesia Postprocedure Evaluation (Signed)
Anesthesia Post Note  Patient: Jackson Sherman  Procedure(s) Performed: TOTAL HIP ARTHROPLASTY ANTERIOR APPROACH (Right Hip)     Patient location during evaluation: PACU Anesthesia Type: Spinal Level of consciousness: awake and alert Pain management: pain level controlled Vital Signs Assessment: post-procedure vital signs reviewed and stable Respiratory status: spontaneous breathing, respiratory function stable and patient connected to nasal cannula oxygen Cardiovascular status: blood pressure returned to baseline and stable Postop Assessment: spinal receding and no apparent nausea or vomiting Anesthetic complications: no    Last Vitals:  Vitals:   12/03/18 0954 12/03/18 1309  BP: 123/75 125/82  Pulse: 62 (!) 59  Resp: 16 18  Temp: 37.1 C 36.6 C  SpO2: 97% 99%    Last Pain:  Vitals:   12/03/18 0809  TempSrc:   PainSc: [name]

## 2018-12-12 ENCOUNTER — Other Ambulatory Visit: Payer: Self-pay | Admitting: Family Medicine

## 2018-12-12 NOTE — Progress Notes (Signed)
Office: [phone]  /  Fax: [phone] TeleHealth Visit:  Jackson Sherman has verbally consented to this TeleHealth visit today. The patient is located at home, the provider is located at the [employer] and Wellness office. The participants in this visit include the listed provider and patient and any and all parties involved. The visit was conducted today via FaceTime.  HPI:   Chief Complaint: OBESITY Jackson Sherman is here to discuss his progress with his obesity treatment plan. He is on the Category 3 plan and is following his eating plan approximately 80 to 85 % of the time. He states he is exercising 0 minutes 0 times per week. Jackson Sherman is unsure of his weight at this time, as he had "hip replacement" last week. He is undergoing physical therapy and it is going well. Jackson Sherman is doing well. We were unable to weigh the patient today for this TeleHealth visit. He feels unsure if he has lost weight since his last visit.   Vitamin D deficiency Jackson Sherman has a diagnosis of vitamin D deficiency. He is currently taking vit D and denies nausea, vomiting or muscle weakness.  Hypertension Jackson Sherman is a 58 y.o. male with hypertension. She is taking Cozaar. Jackson Sherman denies chest pain or shortness of breath on exertion. He is working weight loss to help control his blood pressure with the goal of decreasing his risk of heart attack and stroke. Jackson Sherman blood pressure is well controlled.  ASSESSMENT AND PLAN:  Vitamin D deficiency - Plan: Vitamin D, Ergocalciferol, (DRISDOL) 1.25 MG (50000 UT) CAPS capsule  Essential hypertension  Class 2 severe obesity with serious comorbidity and body mass index (BMI) of 37.0 to 37.9 in adult, unspecified obesity type ([place])  PLAN:  Vitamin D Deficiency Jackson Sherman was informed that low vitamin D levels contributes to fatigue and are associated with obesity, breast, and colon cancer. He agrees to continue to take prescription Vit D @50 ,000 IU  every week #4 with no refills and will follow up for routine testing of vitamin D, at least 2-3 times per year. He was informed of the risk of over-replacement of vitamin D and agrees to not increase his dose unless he discusses this with [place] first. Jackson Sherman agrees to follow up as directed.  Hypertension We discussed sodium restriction, working on healthy weight loss, and a regular exercise program as the means to achieve improved blood pressure control. Jackson Sherman agreed with this plan and agreed to follow up as directed. We will continue to monitor his blood pressure as well as his progress with the above lifestyle modifications. He will continue his medications as prescribed and will watch for signs of hypotension as he continues his lifestyle modifications.  Obesity Jackson Sherman is currently in the action stage of change. As such, his goal is to continue with weight loss efforts He has agreed to follow the Category 3 plan Jackson Sherman has been instructed to work up to a goal of 150 minutes of combined cardio and strengthening exercise per week for weight loss and overall health benefits. We discussed the following Behavioral Modification Strategies today: increase H2O intake (will use water bottles at home), no skipping meals, keeping healthy foods in the home, increasing lean protein intake, decreasing simple carbohydrates, increasing vegetables, decrease eating out and work on meal planning and easy cooking plans Jackson Sherman will weigh himself at home.  Jackson Sherman has agreed to follow up with our clinic in 2 weeks. He was informed of the importance of frequent follow up visits  to maximize his success with intensive lifestyle modifications for his multiple health conditions.  ALLERGIES: No Known Allergies  MEDICATIONS: Current Outpatient Medications on File Prior to Visit  Medication Sig Dispense Refill   aspirin EC 325 MG tablet Take 1 tablet (325 mg total) by mouth 2 (two) times daily after a meal. Take x 1  month post op to decrease risk of blood clots. 60 tablet 0   atorvastatin (LIPITOR) 20 MG tablet Take 20 mg by mouth at bedtime (Patient taking differently: Take 20 mg by mouth at bedtime. Take 20 mg by mouth at bedtime) 90 tablet 3   diphenhydrAMINE (BENADRYL) 25 MG tablet Take 25 mg by mouth at bedtime as needed for sleep.      docusate sodium (COLACE) 100 MG capsule Take 1 capsule (100 mg total) by mouth 2 (two) times daily. 30 capsule 0   escitalopram (LEXAPRO) 20 MG tablet Take 1 tablet by mouth once daily (Patient taking differently: Take 20 mg by mouth daily. ) 90 tablet 0   loratadine (CLARITIN) 10 MG tablet Take 10 mg by mouth daily.     losartan (COZAAR) 50 MG tablet Take 1 tablet (50 mg total) by mouth daily. 90 tablet 1   oxyCODONE-acetaminophen (PERCOCET/ROXICET) 5-325 MG tablet Take 1-2 tablets by mouth every 6 (six) hours as needed for severe pain. 40 tablet 0   sildenafil (REVATIO) 20 MG tablet Take 2-5 tablets (40-100 mg total) by mouth daily as needed. (Patient not taking: Reported on 11/24/2018) 50 tablet 4   tiZANidine (ZANAFLEX) 2 MG tablet Take 1 tablet (2 mg total) by mouth every 8 (eight) hours as needed for muscle spasms. 40 tablet 0   No current facility-administered medications on file prior to visit.     PAST MEDICAL HISTORY: Past Medical History:  Diagnosis Date   Alcohol rehabilitation 03-2005, 03-2007   Anxiety    Depression    GERD (gastroesophageal reflux disease)    Hemorrhoids    Hip arthritis    Hyperlipidemia    Hypertension    Joint pain    Osteoarthritis    Seizures ([place])    ?? in August ...more related to ETOH rehab    PAST SURGICAL HISTORY: Past Surgical History:  Procedure Laterality Date   ARTHROSCOPIC REPAIR ACL     left   FRACTURE SURGERY     no surgery on broke thumb   HEMORRHOID SURGERY N/A 11/23/2012   Procedure: HEMORRHOIDECTOMY;  Surgeon: Madilyn Hook, DO;  Location: [facility];  Service: General;  Laterality:  N/A;   JOINT REPLACEMENT Right 12/02/2018   right total hip arthroplasty   TONSILLECTOMY     Age 89    TOTAL HIP ARTHROPLASTY Right 12/02/2018   Procedure: TOTAL HIP ARTHROPLASTY ANTERIOR APPROACH;  Surgeon: Dorna Leitz, MD;  Location: [facility];  Service: Orthopedics;  Laterality: Right;   TOTAL KNEE ARTHROPLASTY Left 06/15/2016   Procedure: LEFT TOTAL KNEE ARTHROPLASTY;  Surgeon: Vickey Huger, MD;  Location: [facility];  Service: Orthopedics;  Laterality: Left;    SOCIAL HISTORY: Social History   Tobacco Use   Smoking status: Former Smoker    Packs/day: 1.00    Years: 15.00    Pack years: 15.00    Types: Cigarettes    Quit date: 06/30/2003    Years since quitting: 15.4   Smokeless tobacco: Never Used  Substance Use Topics   Alcohol use: Not Currently   Drug use: No    FAMILY HISTORY: Family History  Problem Relation Age  of Onset   Heart disease Father    Heart disease Maternal Grandmother 31   Stroke Maternal Grandmother    Diabetes Maternal Grandmother    Hyperlipidemia Mother    Hypertension Mother    Breast cancer Mother 35   Depression Sister    Diabetes Sister    Diabetes Brother    Diabetes Paternal Grandmother    Stroke Paternal Grandmother     ROS: Review of Systems  Respiratory: Negative for shortness of breath (on exertion).   Cardiovascular: Negative for chest pain.  Gastrointestinal: Negative for nausea and vomiting.  Musculoskeletal:       Negative for muscle weakness    PHYSICAL EXAM: Pt in no acute distress  RECENT LABS AND TESTS: BMET    Component Value Date/Time   NA 138 11/24/2018 0852   NA 140 08/30/2018 0930   K 4.1 11/24/2018 0852   CL 106 11/24/2018 0852   CO2 26 11/24/2018 0852   GLUCOSE 99 11/24/2018 0852   BUN 21 (H) 11/24/2018 0852   BUN 19 08/30/2018 0930   CREATININE 1.02 11/24/2018 0852   CREATININE 1.05 08/04/2018 1044   CALCIUM 8.7 (L) 11/24/2018 0852   GFRNONAA >60 11/24/2018 0852   GFRNONAA 78  08/04/2018 1044   GFRAA >60 11/24/2018 0852   GFRAA 90 08/04/2018 1044   Lab Results  Component Value Date   HGBA1C 5.2 11/24/2018   HGBA1C 5.8 (H) 08/04/2018   Lab Results  Component Value Date   INSULIN 15.9 08/30/2018   CBC    Component Value Date/Time   WBC 16.8 (H) 12/03/2018 0408   RBC 4.52 12/03/2018 0408   HGB 12.7 (L) 12/03/2018 0408   HCT 40.9 12/03/2018 0408   PLT 208 12/03/2018 0408   MCV 90.5 12/03/2018 0408   MCH 28.1 12/03/2018 0408   MCHC 31.1 12/03/2018 0408   RDW 13.5 12/03/2018 0408   LYMPHSABS 2.7 11/24/2018 0852   MONOABS 0.7 11/24/2018 0852   EOSABS 0.2 11/24/2018 0852   BASOSABS 0.1 11/24/2018 0852   Iron/TIBC/Ferritin/ %Sat No results found for: IRON, TIBC, FERRITIN, IRONPCTSAT Lipid Panel     Component Value Date/Time   CHOL 168 08/30/2018 0930   TRIG 104 08/30/2018 0930   HDL 42 08/30/2018 0930   CHOLHDL 5.4 (H) 02/15/2018 1149   VLDL 25 09/24/2015 1049   LDLCALC 105 (H) 08/30/2018 0930   LDLCALC 151 (H) 02/15/2018 1149   Hepatic Function Panel     Component Value Date/Time   PROT 7.2 11/24/2018 0852   PROT 7.1 08/30/2018 0930   ALBUMIN 4.0 11/24/2018 0852   ALBUMIN 4.2 08/30/2018 0930   AST 20 11/24/2018 0852   ALT 23 11/24/2018 0852   ALKPHOS 85 11/24/2018 0852   BILITOT 0.4 11/24/2018 0852   BILITOT 0.4 08/30/2018 0930      Component Value Date/Time   TSH 1.430 08/30/2018 0930     Ref. Range 08/30/2018 09:30  Vitamin D, 25-Hydroxy Latest Ref Range: 30.0 - 100.0 ng/mL 28.3 (L)    I, Doreene Nest, am acting as Location manager for [employer]. Owens Shark, DO  I have reviewed the above documentation for accuracy and completeness, and I agree with the above. -Jearld Lesch, DO

## 2018-12-13 ENCOUNTER — Encounter (INDEPENDENT_AMBULATORY_CARE_PROVIDER_SITE_OTHER): Payer: Self-pay | Admitting: Bariatrics

## 2018-12-13 MED ORDER — ESCITALOPRAM OXALATE 20 MG PO TABS: 20.0000 mg | ORAL_TABLET | Freq: Every day | ORAL | 0 refills | Status: DC

## 2018-12-13 MED FILL — ESCITALOPRAM 20 MG TABLET: 20 | 90 days supply | Qty: 90 | Fill #0

## 2018-12-21 ENCOUNTER — Other Ambulatory Visit: Payer: Self-pay

## 2018-12-21 ENCOUNTER — Ambulatory Visit (INDEPENDENT_AMBULATORY_CARE_PROVIDER_SITE_OTHER): Payer: No Typology Code available for payment source | Admitting: Bariatrics

## 2018-12-21 ENCOUNTER — Encounter (INDEPENDENT_AMBULATORY_CARE_PROVIDER_SITE_OTHER): Payer: Self-pay | Admitting: Bariatrics

## 2018-12-21 VITALS — BP 121/78 | HR 60 | Temp 98.6°F | Ht 69.0 in | Wt 225.0 lb

## 2018-12-21 DIAGNOSIS — E559 Vitamin D deficiency, unspecified: Secondary | ICD-10-CM

## 2018-12-21 DIAGNOSIS — E669 Obesity, unspecified: Secondary | ICD-10-CM | POA: Diagnosis not present

## 2018-12-21 DIAGNOSIS — I10 Essential (primary) hypertension: Secondary | ICD-10-CM

## 2018-12-21 DIAGNOSIS — Z6833 Body mass index (BMI) 33.0-33.9, adult: Secondary | ICD-10-CM

## 2018-12-22 ENCOUNTER — Other Ambulatory Visit: Payer: Self-pay | Admitting: *Deleted

## 2018-12-22 DIAGNOSIS — E785 Hyperlipidemia, unspecified: Secondary | ICD-10-CM

## 2018-12-22 MED ORDER — ATORVASTATIN CALCIUM 20 MG PO TABS: ORAL_TABLET | ORAL | 3 refills | Status: DC

## 2018-12-22 MED FILL — ATORVASTATIN 20 MG TABLET: 20 | 90 days supply | Qty: 90 | Fill #0

## 2018-12-22 NOTE — Progress Notes (Signed)
Office: [phone]  /  Fax: [phone]   HPI:   Chief Complaint: OBESITY Jackson Sherman is here to discuss his progress with his obesity treatment plan. He is on the Category 3 plan and is following his eating plan approximately 80 % of the time. He states he is doing physical therapy 45 minutes 2 times per week and he is walking 20 minutes 3 to 4 times per week. Jackson Sherman is down 26 pounds from his last in-house visit, back in March 2020. He states that he has good control over carbohydrates. His weight is 225 lb (102.1 kg) today and has had a weight loss of 26 pounds since his last in-office visit. He has lost 32 lbs since starting treatment with [place].  Vitamin D deficiency Jackson Sherman has a diagnosis of vitamin D deficiency. He is currently taking vit D and denies nausea, vomiting or muscle weakness.  Hypertension Jackson Sherman is a 58 y.o. male with hypertension. He is taking Cozaar. Jackson Sherman denies chest pain or shortness of breath on exertion. He is working weight loss to help control his blood pressure with the goal of decreasing his risk of heart attack and stroke. [name] blood pressure is well controlled.  ASSESSMENT AND PLAN:  Vitamin D deficiency  Essential hypertension  Class 1 obesity with serious comorbidity and body mass index (BMI) of 33.0 to 33.9 in adult, unspecified obesity type  PLAN:  Vitamin D Deficiency Jackson Sherman was informed that low vitamin D levels contributes to fatigue and are associated with obesity, breast, and colon cancer. He will continue to take prescription Vit D @50 ,000 IU every week and will follow up for routine testing of vitamin D, at least 2-3 times per year. He was informed of the risk of over-replacement of vitamin D and agrees to not increase his dose unless he discusses this with [place] first.  Hypertension We discussed sodium restriction, working on healthy weight loss, and a regular exercise program as the means to achieve improved blood  pressure control. Jackson Sherman agreed with this plan and agreed to follow up as directed. We will continue to monitor his blood pressure as well as his progress with the above lifestyle modifications. He will continue his medications as prescribed and will watch for signs of hypotension as he continues his lifestyle modifications.  I spent > than 50% of the 15 minute visit on counseling as documented in the note.  Obesity Jackson Sherman is currently in the action stage of change. As such, his goal is to continue with weight loss efforts He has agreed to follow the Category 3 plan Jackson Sherman will continue walking and activity and he will exercise with resistance bands or small weights for weight loss and overall health benefits. We discussed the following Behavioral Modification Strategies today: increase H2O intake, no skipping meals, keeping healthy foods in the home, increasing lean protein intake, decreasing simple carbohydrates, increasing vegetables, decrease eating out and work on meal planning and easy cooking plans Jackson Sherman will weigh himself at home as needed.  Jackson Sherman has agreed to follow up with our clinic in 2 weeks. He was informed of the importance of frequent follow up visits to maximize his success with intensive lifestyle modifications for his multiple health conditions.  ALLERGIES: No Known Allergies  MEDICATIONS: Current Outpatient Medications on File Prior to Visit  Medication Sig Dispense Refill  . aspirin EC 325 MG tablet Take 1 tablet (325 mg total) by mouth 2 (two) times daily after a meal. Take x 1 month  post op to decrease risk of blood clots. 60 tablet 0  . atorvastatin (LIPITOR) 20 MG tablet Take 20 mg by mouth at bedtime (Patient taking differently: Take 20 mg by mouth at bedtime. Take 20 mg by mouth at bedtime) 90 tablet 3  . diphenhydrAMINE (BENADRYL) 25 MG tablet Take 25 mg by mouth at bedtime as needed for sleep.     Marland Kitchen docusate sodium (COLACE) 100 MG capsule Take 1 capsule  (100 mg total) by mouth 2 (two) times daily. 30 capsule 0  . escitalopram (LEXAPRO) 20 MG tablet Take 1 tablet (20 mg total) by mouth daily. 90 tablet 0  . loratadine (CLARITIN) 10 MG tablet Take 10 mg by mouth daily.    Marland Kitchen losartan (COZAAR) 50 MG tablet Take 1 tablet (50 mg total) by mouth daily. 90 tablet 1  . oxyCODONE-acetaminophen (PERCOCET/ROXICET) 5-325 MG tablet Take 1-2 tablets by mouth every 6 (six) hours as needed for severe pain. 40 tablet 0  . sildenafil (REVATIO) 20 MG tablet Take 2-5 tablets (40-100 mg total) by mouth daily as needed. (Patient not taking: Reported on 11/24/2018) 50 tablet 4  . tiZANidine (ZANAFLEX) 2 MG tablet Take 1 tablet (2 mg total) by mouth every 8 (eight) hours as needed for muscle spasms. 40 tablet 0  . Vitamin D, Ergocalciferol, (DRISDOL) 1.25 MG (50000 UT) CAPS capsule Take 1 capsule (50,000 Units total) by mouth every 7 (seven) days. 4 capsule 0   No current facility-administered medications on file prior to visit.     PAST MEDICAL HISTORY: Past Medical History:  Diagnosis Date  . 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 Anxiety   . Depression   . GERD (gastroesophageal reflux disease)   . Hemorrhoids   . Hip arthritis   . Hyperlipidemia   . Hypertension   . Joint pain   . Osteoarthritis   . Seizures ([place])    ?? in August ...more related to ETOH rehab    PAST SURGICAL HISTORY: Past Surgical History:  Procedure Laterality Date  . ARTHROSCOPIC REPAIR ACL     left  . FRACTURE SURGERY     no surgery on broke thumb  . HEMORRHOID SURGERY N/A 11/23/2012   Procedure: HEMORRHOIDECTOMY;  Surgeon: Madilyn Hook, DO;  Location: [facility];  Service: General;  Laterality: N/A;  . JOINT REPLACEMENT Right 12/02/2018   right total hip arthroplasty  . TONSILLECTOMY     Age 74   . TOTAL HIP ARTHROPLASTY Right 12/02/2018   Procedure: TOTAL HIP ARTHROPLASTY ANTERIOR APPROACH;  Surgeon: Dorna Leitz, MD;  Location: [facility];  Service: Orthopedics;  Laterality:  Right;  . TOTAL KNEE ARTHROPLASTY Left 06/15/2016   Procedure: LEFT TOTAL KNEE ARTHROPLASTY;  Surgeon: Vickey Huger, MD;  Location: [facility];  Service: Orthopedics;  Laterality: Left;    SOCIAL HISTORY: Social History   Tobacco Use  . Smoking status: Former Smoker    Packs/day: 1.00    Years: 15.00    Pack years: 15.00    Types: Cigarettes    Quit date: 06/30/2003    Years since quitting: 15.4  . Smokeless tobacco: Never Used  Substance Use Topics  . Alcohol use: Not Currently  . Drug use: No    FAMILY HISTORY: Family History  Problem Relation Age of Onset  . Heart disease Father   . Heart disease Maternal Grandmother 59  . Stroke Maternal Grandmother   . Diabetes Maternal Grandmother   . Hyperlipidemia Mother   . Hypertension Mother   . Breast  cancer Mother 61  . Depression Sister   . Diabetes Sister   . Diabetes Brother   . Diabetes Paternal Grandmother   . Stroke Paternal Grandmother     ROS: Review of Systems  Constitutional: Positive for weight loss.  Respiratory: Negative for shortness of breath (on exertion).   Cardiovascular: Negative for chest pain.  Gastrointestinal: Negative for nausea and vomiting.  Musculoskeletal:       Negative for muscle weakness    PHYSICAL EXAM: Blood pressure 121/78, pulse 60, temperature 98.6 F (37 C), temperature source Oral, height 5\' 9"  (1.753 m), weight 225 lb (102.1 kg), SpO2 97 %. Body mass index is 33.23 kg/m. Physical Exam Vitals signs reviewed.  Constitutional:      Appearance: Normal appearance. He is well-developed. He is obese.  Cardiovascular:     Rate and Rhythm: Normal rate.  Pulmonary:     Effort: Pulmonary effort is normal.  Musculoskeletal: Normal range of motion.  Skin:    General: Skin is warm and dry.  Neurological:     Mental Status: He is alert and oriented to person, place, and time.  Psychiatric:        Mood and Affect: Mood normal.        Behavior: Behavior normal.     RECENT LABS AND  TESTS: BMET    Component Value Date/Time   NA 138 11/24/2018 0852   NA 140 08/30/2018 0930   K 4.1 11/24/2018 0852   CL 106 11/24/2018 0852   CO2 26 11/24/2018 0852   GLUCOSE 99 11/24/2018 0852   BUN 21 (H) 11/24/2018 0852   BUN 19 08/30/2018 0930   CREATININE 1.02 11/24/2018 0852   CREATININE 1.05 08/04/2018 1044   CALCIUM 8.7 (L) 11/24/2018 0852   GFRNONAA >60 11/24/2018 0852   GFRNONAA 78 08/04/2018 1044   GFRAA >60 11/24/2018 0852   GFRAA 90 08/04/2018 1044   Lab Results  Component Value Date   HGBA1C 5.2 11/24/2018   HGBA1C 5.8 (H) 08/04/2018   Lab Results  Component Value Date   INSULIN 15.9 08/30/2018   CBC    Component Value Date/Time   WBC 16.8 (H) 12/03/2018 0408   RBC 4.52 12/03/2018 0408   HGB 12.7 (L) 12/03/2018 0408   HCT 40.9 12/03/2018 0408   PLT 208 12/03/2018 0408   MCV 90.5 12/03/2018 0408   MCH 28.1 12/03/2018 0408   MCHC 31.1 12/03/2018 0408   RDW 13.5 12/03/2018 0408   LYMPHSABS 2.7 11/24/2018 0852   MONOABS 0.7 11/24/2018 0852   EOSABS 0.2 11/24/2018 0852   BASOSABS 0.1 11/24/2018 0852   Iron/TIBC/Ferritin/ %Sat No results found for: IRON, TIBC, FERRITIN, IRONPCTSAT Lipid Panel     Component Value Date/Time   CHOL 168 08/30/2018 0930   TRIG 104 08/30/2018 0930   HDL 42 08/30/2018 0930   CHOLHDL 5.4 (H) 02/15/2018 1149   VLDL 25 09/24/2015 1049   LDLCALC 105 (H) 08/30/2018 0930   LDLCALC 151 (H) 02/15/2018 1149   Hepatic Function Panel     Component Value Date/Time   PROT 7.2 11/24/2018 0852   PROT 7.1 08/30/2018 0930   ALBUMIN 4.0 11/24/2018 0852   ALBUMIN 4.2 08/30/2018 0930   AST 20 11/24/2018 0852   ALT 23 11/24/2018 0852   ALKPHOS 85 11/24/2018 0852   BILITOT 0.4 11/24/2018 0852   BILITOT 0.4 08/30/2018 0930      Component Value Date/Time   TSH 1.430 08/30/2018 0930     Ref. Range 08/30/2018 09:30  Vitamin D, 25-Hydroxy Latest Ref Range: 30.0 - 100.0 ng/mL 28.3 (L)    OBESITY BEHAVIORAL INTERVENTION VISIT   Today's visit was # 9   Starting weight: 257 lbs Starting date: 08/30/2018 Today's weight : 225 lbs Today's date: 12/21/2018 Total lbs lost to date: 32    12/21/2018  Height 5\' 9"  (1.753 m)  Weight 225 lb (102.1 kg)  BMI (Calculated) 33.21  BLOOD PRESSURE - SYSTOLIC 536  BLOOD PRESSURE - DIASTOLIC 78   Body Fat % 14.4 %  Total Body Water (lbs) 107.8 lbs    ASK: We discussed the diagnosis of obesity with Jackson Sherman today and Carsten agreed to give [place] permission to discuss obesity behavioral modification therapy today.  ASSESS: Mannie has the diagnosis of obesity and his BMI today is 33.21 Axl is in the action stage of change   ADVISE: Bryor was educated on the multiple health risks of obesity as well as the benefit of weight loss to improve his health. He was advised of the need for long term treatment and the importance of lifestyle modifications to improve his current health and to decrease his risk of future health problems.  AGREE: Multiple dietary modification options and treatment options were discussed and  Akio agreed to follow the recommendations documented in the above note.  ARRANGE: Marguis was educated on the importance of frequent visits to treat obesity as outlined per CMS and USPSTF guidelines and agreed to schedule his next follow up appointment today.  Corey Skains, am acting as Location manager for [employer]. Owens Shark, DO   I have reviewed the above documentation for accuracy and completeness, and I agree with the above. -Jearld Lesch, DO

## 2019-01-02 DIAGNOSIS — F322 Major depressive disorder, single episode, severe without psychotic features: Secondary | ICD-10-CM | POA: Insufficient documentation

## 2019-01-05 ENCOUNTER — Ambulatory Visit (INDEPENDENT_AMBULATORY_CARE_PROVIDER_SITE_OTHER): Payer: No Typology Code available for payment source | Admitting: Bariatrics

## 2019-01-05 ENCOUNTER — Telehealth: Payer: Self-pay

## 2019-01-05 DIAGNOSIS — F329 Major depressive disorder, single episode, unspecified: Secondary | ICD-10-CM

## 2019-01-05 DIAGNOSIS — F4310 Post-traumatic stress disorder, unspecified: Secondary | ICD-10-CM

## 2019-01-05 DIAGNOSIS — F1010 Alcohol abuse, uncomplicated: Secondary | ICD-10-CM

## 2019-01-05 DIAGNOSIS — F32A Depression, unspecified: Secondary | ICD-10-CM

## 2019-01-05 MED FILL — traZODone HCL 50 MG TABS: 50 | 30 days supply | Qty: 30 | Fill #0

## 2019-01-05 MED FILL — busPIRone HCL 5 MG TABS: 5 | 30 days supply | Qty: 90 | Fill #0

## 2019-01-05 MED FILL — ATORVASTATIN 20 MG TABLET: 20 | 90 days supply | Qty: 90 | Fill #0

## 2019-01-05 NOTE — Telephone Encounter (Signed)
Referrals placed, Cindy advised. Also called and advised patient.

## 2019-01-05 NOTE — Telephone Encounter (Signed)
Patient called, due to his [employer] insurance he needs referrals placed for;  "[facility] for intensive outpatient services and medication management" and also one for [facility] for individual therapy

## 2019-01-05 NOTE — Telephone Encounter (Signed)
Thanks Apolonio Schneiders, if you could put these in that would be awesome.

## 2019-01-09 MED FILL — AMOXICILLIN 500 MG CAPSULE: 500 | 5 days supply | Qty: 20 | Fill #0

## 2019-01-25 MED FILL — busPIRone HCL 15 MG TABS: 15 | 30 days supply | Qty: 60 | Fill #0

## 2019-01-25 MED FILL — NALTREXONE 50 MG TABLET: 50 | 30 days supply | Qty: 30 | Fill #0

## 2019-01-25 MED FILL — traZODone HCL 100 MG TABS: 100 | 30 days supply | Qty: 30 | Fill #0

## 2019-01-26 MED FILL — AMOXICILLIN 500 MG CAPSULE: 500 | 5 days supply | Qty: 20 | Fill #0

## 2019-02-21 ENCOUNTER — Other Ambulatory Visit: Payer: Self-pay | Admitting: Family Medicine

## 2019-02-21 MED FILL — LOSARTAN POTASSIUM 50 MG TA: 50 | 30 days supply | Qty: 30 | Fill #0

## 2019-02-23 MED FILL — busPIRone HCL 15 MG TABS: 15 | 30 days supply | Qty: 60 | Fill #0

## 2019-02-23 MED FILL — ESCITALOPRAM 20 MG TABLET: 20 | 30 days supply | Qty: 30 | Fill #0

## 2019-02-23 MED FILL — NALTREXONE 50 MG TABLET: 50 | 30 days supply | Qty: 30 | Fill #0

## 2019-02-23 MED FILL — REXULTI 2 MG TABLET: 2 | 30 days supply | Qty: 30 | Fill #0

## 2019-02-23 MED FILL — traZODone HCL 100 MG TABS: 100 | 30 days supply | Qty: 30 | Fill #0

## 2019-03-21 MED FILL — ESCITALOPRAM 20 MG TABLET: 20 | 30 days supply | Qty: 30 | Fill #0

## 2019-03-21 MED FILL — ATORVASTATIN 20 MG TABLET: 20 | 90 days supply | Qty: 90 | Fill #1

## 2019-03-21 MED FILL — LOSARTAN POTASSIUM 50 MG TA: 50 | 90 days supply | Qty: 90 | Fill #1

## 2019-03-29 ENCOUNTER — Ambulatory Visit (INDEPENDENT_AMBULATORY_CARE_PROVIDER_SITE_OTHER): Payer: No Typology Code available for payment source | Admitting: Family Medicine

## 2019-03-29 ENCOUNTER — Other Ambulatory Visit: Payer: Self-pay

## 2019-03-29 ENCOUNTER — Encounter: Payer: Self-pay | Admitting: Family Medicine

## 2019-03-29 VITALS — BP 114/72 | HR 67 | Ht 69.0 in | Wt 233.0 lb

## 2019-03-29 DIAGNOSIS — Z23 Encounter for immunization: Secondary | ICD-10-CM

## 2019-03-29 DIAGNOSIS — E88819 Insulin resistance, unspecified: Secondary | ICD-10-CM

## 2019-03-29 DIAGNOSIS — E8881 Metabolic syndrome: Secondary | ICD-10-CM | POA: Diagnosis not present

## 2019-03-29 DIAGNOSIS — F329 Major depressive disorder, single episode, unspecified: Secondary | ICD-10-CM

## 2019-03-29 DIAGNOSIS — F1010 Alcohol abuse, uncomplicated: Secondary | ICD-10-CM | POA: Diagnosis not present

## 2019-03-29 DIAGNOSIS — G4700 Insomnia, unspecified: Secondary | ICD-10-CM

## 2019-03-29 DIAGNOSIS — I10 Essential (primary) hypertension: Secondary | ICD-10-CM | POA: Diagnosis not present

## 2019-03-29 DIAGNOSIS — F32A Depression, unspecified: Secondary | ICD-10-CM

## 2019-03-29 MED ORDER — ESCITALOPRAM OXALATE 20 MG PO TABS: 20.0000 mg | ORAL_TABLET | Freq: Every day | ORAL | 1 refills | Status: DC

## 2019-03-29 MED ORDER — TRAZODONE HCL 100 MG PO TABS: 100.0000 mg | ORAL_TABLET | Freq: Every day | ORAL | 1 refills | Status: DC

## 2019-03-29 MED ORDER — NALTREXONE HCL 50 MG PO TABS: 50.0000 mg | ORAL_TABLET | Freq: Every day | ORAL | 1 refills | Status: DC

## 2019-03-29 MED ORDER — REXULTI 2 MG PO TABS: 2.0000 mg | ORAL_TABLET | Freq: Every day | ORAL | 1 refills | Status: DC

## 2019-03-29 MED ORDER — BUSPIRONE HCL 15 MG PO TABS: 15.0000 mg | ORAL_TABLET | Freq: Two times a day (BID) | ORAL | 1 refills | Status: DC

## 2019-03-29 MED FILL — traZODone HCL 100 MG TABS: 100 | 90 days supply | Qty: 90 | Fill #0

## 2019-03-29 MED FILL — NALTREXONE 50 MG TABLET: 50 | 90 days supply | Qty: 90 | Fill #0

## 2019-03-29 MED FILL — busPIRone HCL 15 MG TABS: 15 | 90 days supply | Qty: 180 | Fill #0

## 2019-03-29 MED FILL — REXULTI 2 MG TABLET: 2 | 90 days supply | Qty: 90 | Fill #0

## 2019-03-29 MED FILL — ESCITALOPRAM 20 MG TABLET: 20 | 90 days supply | Qty: 90 | Fill #0

## 2019-03-29 NOTE — Assessment & Plan Note (Signed)
Prescription for trazodone.  New prescription sent to pharmacy again he is doing well with that without any significant side effects.

## 2019-03-29 NOTE — Assessment & Plan Note (Signed)
Well controlled. Continue current regimen. Follow up in  6 mo  

## 2019-03-29 NOTE — Progress Notes (Signed)
Established Patient Office Visit  Subjective:  Patient ID: Jackson Sherman, male    DOB: 05-Jul-1960  Age: 58 y.o. MRN: [id number]  CC:  Chief Complaint  Patient presents with  . Follow-up    HPI Jackson Sherman presents for  Hypertension- Pt denies chest pain, SOB, dizziness, or heart palpitations.  Taking meds as directed w/o problems.  Denies medication side effects.    F/U depression - he is on Rexulti, Lexapro and buspar for mood and he would like me to take over his scripts.   He was started on these while he was recently in detox.  He had a right hip replacement in June and was given some pain medications postsurgically and unfortunately became dependent on them and started drinking alcohol again.  He is not was in detox in an outpatient program and just completed that in August.  He is actually been doing really well since then.  They started him on these new medications and he is wanting me to go ahead and take over prescribing those they were coming from a nurse practitioner that [facility] was [facility].  Past Medical History:  Diagnosis Date  . 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 Anxiety   . Depression   . GERD (gastroesophageal reflux disease)   . Hemorrhoids   . Hip arthritis   . Hyperlipidemia   . Hypertension   . Joint pain   . Osteoarthritis   . Seizures ([place])    ?? in August ...more related to ETOH rehab    Past Surgical History:  Procedure Laterality Date  . ARTHROSCOPIC REPAIR ACL     left  . FRACTURE SURGERY     no surgery on broke thumb  . HEMORRHOID SURGERY N/A 11/23/2012   Procedure: HEMORRHOIDECTOMY;  Surgeon: Madilyn Hook, DO;  Location: [facility];  Service: General;  Laterality: N/A;  . JOINT REPLACEMENT Right 12/02/2018   right total hip arthroplasty  . TONSILLECTOMY     Age 51   . TOTAL HIP ARTHROPLASTY Right 12/02/2018   Procedure: TOTAL HIP ARTHROPLASTY ANTERIOR APPROACH;  Surgeon: Dorna Leitz, MD;  Location: [facility];   Service: Orthopedics;  Laterality: Right;  . TOTAL KNEE ARTHROPLASTY Left 06/15/2016   Procedure: LEFT TOTAL KNEE ARTHROPLASTY;  Surgeon: Vickey Huger, MD;  Location: [facility];  Service: Orthopedics;  Laterality: Left;    Family History  Problem Relation Age of Onset  . Heart disease Father   . Heart disease Maternal Grandmother 66  . Stroke Maternal Grandmother   . Diabetes Maternal Grandmother   . Hyperlipidemia Mother   . Hypertension Mother   . Breast cancer Mother 35  . Depression Sister   . Diabetes Sister   . Diabetes Brother   . Diabetes Paternal Grandmother   . Stroke Paternal Grandmother     Social History   Socioeconomic History  . Marital status: Married    Spouse name: Not on file  . Number of children: 4  . Years of education: Not on file  . Highest education level: Not on file  Occupational History  . Occupation: retired [employer], current Financial planner: [employer]  . Financial resource strain: Not on file  . Food insecurity    Worry: Not on file    Inability: Not on file  . Transportation needs    Medical: Not on file    Non-medical: Not on file  Tobacco Use  . Smoking  status: Former Smoker    Packs/day: 1.00    Years: 15.00    Pack years: 15.00    Types: Cigarettes    Quit date: 06/30/2003    Years since quitting: 15.7  . Smokeless tobacco: Never Used  Substance and Sexual Activity  . Alcohol use: Not Currently  . Drug use: No  . Sexual activity: Yes    Partners: Female    Comment: married, 4 kids, works for [employer], trying to lose wt., started exercising program  Lifestyle  . Physical activity    Days per week: Not on file    Minutes per session: Not on file  . Stress: Not on file  Relationships  . Social Herbalist on phone: Not on file    Gets together: Not on file    Attends religious service: Not on file    Active member of club or organization: Not on file    Attends  meetings of clubs or organizations: Not on file    Relationship status: Not on file  . Intimate partner violence    Fear of current or ex partner: Not on file    Emotionally abused: Not on file    Physically abused: Not on file    Forced sexual activity: Not on file  Other Topics Concern  . Not on file  Social History Narrative   No regular exercise.  3-4 cups per day of caffeine.     11/30/18- attends [facility] and has lost 33 lbs since starting with them    Outpatient Medications Prior to Visit  Medication Sig Dispense Refill  . aspirin EC 325 MG tablet Take 1 tablet (325 mg total) by mouth 2 (two) times daily after a meal. Take x 1 month post op to decrease risk of blood clots. 60 tablet 0  . atorvastatin (LIPITOR) 20 MG tablet Take 20 mg by mouth at bedtime 90 tablet 3  . diphenhydrAMINE (BENADRYL) 25 MG tablet Take 25 mg by mouth at bedtime as needed for sleep.     Marland Kitchen docusate sodium (COLACE) 100 MG capsule Take 1 capsule (100 mg total) by mouth 2 (two) times daily. 30 capsule 0  . loratadine (CLARITIN) 10 MG tablet Take 10 mg by mouth daily.    Marland Kitchen losartan (COZAAR) 50 MG tablet TAKE 1 TABLET (50 MG TOTAL) BY MOUTH DAILY. 90 tablet 1  . sildenafil (REVATIO) 20 MG tablet Take 2-5 tablets (40-100 mg total) by mouth daily as needed. 50 tablet 4  . Vitamin D, Ergocalciferol, (DRISDOL) 1.25 MG (50000 UT) CAPS capsule Take 1 capsule (50,000 Units total) by mouth every 7 (seven) days. 4 capsule 0  . busPIRone (BUSPAR) 15 MG tablet Take 15 mg by mouth 2 (two) times daily.    Marland Kitchen escitalopram (LEXAPRO) 20 MG tablet Take 1 tablet (20 mg total) by mouth daily. 90 tablet 0  . naltrexone (DEPADE) 50 MG tablet Take 50 mg by mouth daily.    Marland Kitchen REXULTI 2 MG TABS tablet Take 2 mg by mouth daily.    . traZODone (DESYREL) 100 MG tablet Take 100 mg by mouth daily.    Marland Kitchen oxyCODONE-acetaminophen (PERCOCET/ROXICET) 5-325 MG tablet Take 1-2 tablets by mouth every 6 (six)  hours as needed for severe pain. 40 tablet 0  . tiZANidine (ZANAFLEX) 2 MG tablet Take 1 tablet (2 mg total) by mouth every 8 (eight) hours as needed for muscle spasms. 40 tablet  0   No facility-administered medications prior to visit.     No Known Allergies  ROS Review of Systems    Objective:    Physical Exam  Constitutional: He is oriented to person, place, and time. He appears well-developed and well-nourished.  HENT:  Head: Normocephalic and atraumatic.  Eyes: Conjunctivae and EOM are normal.  Cardiovascular: Normal rate, regular rhythm and normal heart sounds.  Pulmonary/Chest: Effort normal and breath sounds normal.  Neurological: He is alert and oriented to person, place, and time.  Skin: Skin is warm and dry. No pallor.  Psychiatric: He has a normal mood and affect. His behavior is normal.  Vitals reviewed.   BP 114/72   Pulse 67   Ht 5\' 9"  (1.753 m)   Wt 233 lb (105.7 kg)   SpO2 98%   BMI 34.41 kg/m  Wt Readings from Last 3 Encounters:  03/29/19 233 lb (105.7 kg)  12/21/18 225 lb (102.1 kg)  11/24/18 243 lb 9 oz (110.5 kg)     There are no preventive care reminders to display for this patient.  There are no preventive care reminders to display for this patient.  Lab Results  Component Value Date   TSH 1.430 08/30/2018   Lab Results  Component Value Date   WBC 16.8 (H) 12/03/2018   HGB 12.7 (L) 12/03/2018   HCT 40.9 12/03/2018   MCV 90.5 12/03/2018   PLT 208 12/03/2018   Lab Results  Component Value Date   NA 138 11/24/2018   K 4.1 11/24/2018   CO2 26 11/24/2018   GLUCOSE 99 11/24/2018   BUN 21 (H) 11/24/2018   CREATININE 1.02 11/24/2018   BILITOT 0.4 11/24/2018   ALKPHOS 85 11/24/2018   AST 20 11/24/2018   ALT 23 11/24/2018   PROT 7.2 11/24/2018   ALBUMIN 4.0 11/24/2018   CALCIUM 8.7 (L) 11/24/2018   ANIONGAP 6 11/24/2018   Lab Results  Component Value Date   CHOL 168 08/30/2018   Lab Results  Component Value Date   HDL 42  08/30/2018   Lab Results  Component Value Date   LDLCALC 105 (H) 08/30/2018   Lab Results  Component Value Date   TRIG 104 08/30/2018   Lab Results  Component Value Date   CHOLHDL 5.4 (H) 02/15/2018   Lab Results  Component Value Date   HGBA1C 5.2 11/24/2018      Assessment & Plan:   Problem List Items Addressed This Visit      Cardiovascular and Mediastinum   Essential hypertension - Primary    Well controlled. Continue current regimen. Follow up in  6 mo      Relevant Orders   BASIC METABOLIC PANEL WITH GFR     Endocrine   Insulin resistance    Last A1c looked fantastic he really had made some great improvements and has lost some weight.  Just continue to work on healthy diet and regular exercise and will plan to recheck A1c again.      Relevant Orders   Hemoglobin A1c     Other   Unipolar depression ([place])    Psych is doing really well on his current regimen and is happy with that.  Will refill medications and take over those prescriptions today.  I like to see him back in 4 months to make sure that he is continuing to do well and that we do not need to make any adjustments. Will need to monitor A1C and lipids since on mood stabilizer.  Relevant Medications   escitalopram (LEXAPRO) 20 MG tablet   busPIRone (BUSPAR) 15 MG tablet   REXULTI 2 MG TABS tablet   traZODone (DESYREL) 100 MG tablet   Insomnia    Prescription for trazodone.  New prescription sent to pharmacy again he is doing well with that without any significant side effects.      Relevant Medications   traZODone (DESYREL) 100 MG tablet   Alcohol abuse    He is doing well so fair.  Finished detox last month.        Relevant Medications   naltrexone (DEPADE) 50 MG tablet    Other Visit Diagnoses    Need for Zostavax administration       Relevant Orders   Varicella-zoster vaccine IM (Completed)      Meds ordered this encounter  Medications  . escitalopram (LEXAPRO) 20 MG tablet     Sig: Take 1 tablet (20 mg total) by mouth daily.    Dispense:  90 tablet    Refill:  1  . busPIRone (BUSPAR) 15 MG tablet    Sig: Take 1 tablet (15 mg total) by mouth 2 (two) times daily.    Dispense:  180 tablet    Refill:  1  . naltrexone (DEPADE) 50 MG tablet    Sig: Take 1 tablet (50 mg total) by mouth daily.    Dispense:  90 tablet    Refill:  1  . REXULTI 2 MG TABS tablet    Sig: Take 1 tablet (2 mg total) by mouth daily.    Dispense:  180 tablet    Refill:  1  . traZODone (DESYREL) 100 MG tablet    Sig: Take 1 tablet (100 mg total) by mouth at bedtime.    Dispense:  90 tablet    Refill:  1    Follow-up: Return in about 4 months (around 07/29/2019) for mood medications and BP.    Beatrice Lecher, MD

## 2019-03-29 NOTE — Assessment & Plan Note (Addendum)
Psych is doing really well on his current regimen and is happy with that.  Will refill medications and take over those prescriptions today.  I like to see him back in 4 months to make sure that he is continuing to do well and that we do not need to make any adjustments. Will need to monitor A1C and lipids since on mood stabilizer.

## 2019-03-29 NOTE — Assessment & Plan Note (Signed)
He is doing well so fair.  Finished detox last month.

## 2019-03-29 NOTE — Assessment & Plan Note (Signed)
Last A1c looked fantastic he really had made some great improvements and has lost some weight.  Just continue to work on healthy diet and regular exercise and will plan to recheck A1c again.

## 2019-03-31 MED FILL — LOSARTAN POTASSIUM 50 MG TA: 50 | 30 days supply | Qty: 30 | Fill #1

## 2019-03-31 MED FILL — ATORVASTATIN 20 MG TABLET: 20 | 90 days supply | Qty: 90 | Fill #1

## 2019-04-25 ENCOUNTER — Encounter (INDEPENDENT_AMBULATORY_CARE_PROVIDER_SITE_OTHER): Payer: Self-pay

## 2019-04-25 ENCOUNTER — Telehealth: Payer: Self-pay

## 2019-04-25 ENCOUNTER — Other Ambulatory Visit: Payer: Self-pay

## 2019-04-25 ENCOUNTER — Telehealth (INDEPENDENT_AMBULATORY_CARE_PROVIDER_SITE_OTHER): Payer: No Typology Code available for payment source | Admitting: Family Medicine

## 2019-04-25 ENCOUNTER — Encounter: Payer: Self-pay | Admitting: Family Medicine

## 2019-04-25 VITALS — HR 60

## 2019-04-25 DIAGNOSIS — R05 Cough: Secondary | ICD-10-CM

## 2019-04-25 DIAGNOSIS — Z20828 Contact with and (suspected) exposure to other viral communicable diseases: Secondary | ICD-10-CM

## 2019-04-25 DIAGNOSIS — R059 Cough, unspecified: Secondary | ICD-10-CM

## 2019-04-25 DIAGNOSIS — Z20822 Contact with and (suspected) exposure to covid-19: Secondary | ICD-10-CM

## 2019-04-25 NOTE — Progress Notes (Signed)
Virtual Visit via Video Note  I connected with Jackson Sherman on 04/25/19 at 10:50 AM EDT by a video enabled telemedicine application and verified that I am speaking with the correct person using two identifiers.   I discussed the limitations of evaluation and management by telemedicine and the availability of in person appointments. The patient expressed understanding and agreed to proceed.    Acute Office Visit  Subjective:    Patient ID: Jackson Sherman, male    DOB: September 25, 1960, 58 y.o.   MRN: [id number]  Chief Complaint  Patient presents with  . Cough    HPI Patient is in today for Pt reorts that he has had the cough x 3 days.  Mostly dry but feels like it somewhat productive in his chest. Began as a pain on R side of his chest and then turned into chest congestion and cough. No SOB his O2 94%.  Now he just has some pressure across his chest bilaterally.  He denies any increased work of breathing.  Denies any fever, chills or sweats. He has not taken any medication for this.  Mild scratchy throat.  Denies f/s/c/n/v he does report having an upset stomach w/diaherra. No loss of taste or smell.  Past Medical History:  Diagnosis Date  . 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 Anxiety   . Depression   . GERD (gastroesophageal reflux disease)   . Hemorrhoids   . Hip arthritis   . Hyperlipidemia   . Hypertension   . Joint pain   . Osteoarthritis   . Seizures ([place])    ?? in August ...more related to ETOH rehab    Past Surgical History:  Procedure Laterality Date  . ARTHROSCOPIC REPAIR ACL     left  . FRACTURE SURGERY     no surgery on broke thumb  . HEMORRHOID SURGERY N/A 11/23/2012   Procedure: HEMORRHOIDECTOMY;  Surgeon: Madilyn Hook, DO;  Location: [facility];  Service: General;  Laterality: N/A;  . JOINT REPLACEMENT Right 12/02/2018   right total hip arthroplasty  . TONSILLECTOMY     Age 3   . TOTAL HIP ARTHROPLASTY Right 12/02/2018   Procedure: TOTAL HIP  ARTHROPLASTY ANTERIOR APPROACH;  Surgeon: Dorna Leitz, MD;  Location: [facility];  Service: Orthopedics;  Laterality: Right;  . TOTAL KNEE ARTHROPLASTY Left 06/15/2016   Procedure: LEFT TOTAL KNEE ARTHROPLASTY;  Surgeon: Vickey Huger, MD;  Location: [facility];  Service: Orthopedics;  Laterality: Left;    Family History  Problem Relation Age of Onset  . Heart disease Father   . Heart disease Maternal Grandmother 56  . Stroke Maternal Grandmother   . Diabetes Maternal Grandmother   . Hyperlipidemia Mother   . Hypertension Mother   . Breast cancer Mother 36  . Depression Sister   . Diabetes Sister   . Diabetes Brother   . Diabetes Paternal Grandmother   . Stroke Paternal Grandmother     Social History   Socioeconomic History  . Marital status: Married    Spouse name: Not on file  . Number of children: 4  . Years of education: Not on file  . Highest education level: Not on file  Occupational History  . Occupation: retired [employer], current Financial planner: [employer]  . Financial resource strain: Not on file  . Food insecurity    Worry: Not on file    Inability: Not on file  . Transportation needs    Medical: Not  on file    Non-medical: Not on file  Tobacco Use  . Smoking status: Former Smoker    Packs/day: 1.00    Years: 15.00    Pack years: 15.00    Types: Cigarettes    Quit date: 06/30/2003    Years since quitting: 15.8  . Smokeless tobacco: Never Used  Substance and Sexual Activity  . Alcohol use: Not Currently  . Drug use: No  . Sexual activity: Yes    Partners: Female    Comment: married, 4 kids, works for [employer], [employer], trying to lose wt., started exercising program  Lifestyle  . Physical activity    Days per week: Not on file    Minutes per session: Not on file  . Stress: Not on file  Relationships  . Social Herbalist on phone: Not on file    Gets together: Not on file    Attends religious service: Not  on file    Active member of club or organization: Not on file    Attends meetings of clubs or organizations: Not on file    Relationship status: Not on file  . Intimate partner violence    Fear of current or ex partner: Not on file    Emotionally abused: Not on file    Physically abused: Not on file    Forced sexual activity: Not on file  Other Topics Concern  . Not on file  Social History Narrative   No regular exercise.  3-4 cups per day of caffeine.     11/30/18- attends [facility] and has lost 33 lbs since starting with them    Outpatient Medications Prior to Visit  Medication Sig Dispense Refill  . aspirin EC 325 MG tablet Take 1 tablet (325 mg total) by mouth 2 (two) times daily after a meal. Take x 1 month post op to decrease risk of blood clots. 60 tablet 0  . atorvastatin (LIPITOR) 20 MG tablet Take 20 mg by mouth at bedtime 90 tablet 3  . busPIRone (BUSPAR) 15 MG tablet Take 1 tablet (15 mg total) by mouth 2 (two) times daily. 180 tablet 1  . diphenhydrAMINE (BENADRYL) 25 MG tablet Take 25 mg by mouth at bedtime as needed for sleep.     Marland Kitchen docusate sodium (COLACE) 100 MG capsule Take 1 capsule (100 mg total) by mouth 2 (two) times daily. 30 capsule 0  . escitalopram (LEXAPRO) 20 MG tablet Take 1 tablet (20 mg total) by mouth daily. 90 tablet 1  . loratadine (CLARITIN) 10 MG tablet Take 10 mg by mouth daily.    Marland Kitchen losartan (COZAAR) 50 MG tablet TAKE 1 TABLET (50 MG TOTAL) BY MOUTH DAILY. 90 tablet 1  . naltrexone (DEPADE) 50 MG tablet Take 1 tablet (50 mg total) by mouth daily. 90 tablet 1  . REXULTI 2 MG TABS tablet Take 1 tablet (2 mg total) by mouth daily. 180 tablet 1  . sildenafil (REVATIO) 20 MG tablet Take 2-5 tablets (40-100 mg total) by mouth daily as needed. 50 tablet 4  . traZODone (DESYREL) 100 MG tablet Take 1 tablet (100 mg total) by mouth at bedtime. 90 tablet 1  . Vitamin D, Ergocalciferol, (DRISDOL) 1.25 MG (50000 UT) CAPS  capsule Take 1 capsule (50,000 Units total) by mouth every 7 (seven) days. 4 capsule 0   No facility-administered medications prior to visit.     No Known Allergies  ROS  Objective:    Physical Exam  Constitutional: He is oriented to person, place, and time. He appears well-developed and well-nourished.  HENT:  Head: Normocephalic and atraumatic.  Eyes: Conjunctivae and EOM are normal.  Pulmonary/Chest: Effort normal.  Neurological: He is alert and oriented to person, place, and time.  Skin: Skin is dry. No pallor.  Psychiatric: He has a normal mood and affect. His behavior is normal.  Vitals reviewed.   Pulse 60  Wt Readings from Last 3 Encounters:  03/29/19 233 lb (105.7 kg)  12/21/18 225 lb (102.1 kg)  11/24/18 243 lb 9 oz (110.5 kg)    There are no preventive care reminders to display for this patient.  There are no preventive care reminders to display for this patient.   Lab Results  Component Value Date   TSH 1.430 08/30/2018   Lab Results  Component Value Date   WBC 16.8 (H) 12/03/2018   HGB 12.7 (L) 12/03/2018   HCT 40.9 12/03/2018   MCV 90.5 12/03/2018   PLT 208 12/03/2018   Lab Results  Component Value Date   NA 138 11/24/2018   K 4.1 11/24/2018   CO2 26 11/24/2018   GLUCOSE 99 11/24/2018   BUN 21 (H) 11/24/2018   CREATININE 1.02 11/24/2018   BILITOT 0.4 11/24/2018   ALKPHOS 85 11/24/2018   AST 20 11/24/2018   ALT 23 11/24/2018   PROT 7.2 11/24/2018   ALBUMIN 4.0 11/24/2018   CALCIUM 8.7 (L) 11/24/2018   ANIONGAP 6 11/24/2018   Lab Results  Component Value Date   CHOL 168 08/30/2018   Lab Results  Component Value Date   HDL 42 08/30/2018   Lab Results  Component Value Date   LDLCALC 105 (H) 08/30/2018   Lab Results  Component Value Date   TRIG 104 08/30/2018   Lab Results  Component Value Date   CHOLHDL 5.4 (H) 02/15/2018   Lab Results  Component Value Date   HGBA1C 5.2 11/24/2018       Assessment & Plan:    Problem List Items Addressed This Visit    None    Visit Diagnoses    Cough    -  Primary   Relevant Orders   Temperature monitoring   Suspected COVID-19 virus infection       Relevant Orders   Temperature monitoring     Cough, suspect Covid infection.  Recommend that he go and get tested for further evaluation.  Call if any new symptoms develop such as fever or shortness of breath or increased chest pain.  Call if not better in 1 week as well.  Okay for symptomatic care.  No orders of the defined types were placed in this encounter.    I discussed the assessment and treatment plan with the patient. The patient was provided an opportunity to ask questions and all were answered. The patient agreed with the plan and demonstrated an understanding of the instructions.   The patient was advised to call back or seek an in-person evaluation if the symptoms worsen or if the condition fails to improve as anticipated.  Beatrice Lecher, MD

## 2019-04-25 NOTE — Telephone Encounter (Signed)
Patient advise on cough and diarrhea per protocol:  If cough remains the same or better: continue to treat with over the counter medications. Hard candy or cough drops and drinking warm fluids. Adults can also use honey 2 tsp (10 ML) at bedtime.   HONEY IS NOT RECOMMENDED FOR INFANTS UNDER ONE.  If cough is becoming worse even with the use of over the counter medications and patient is not able to sleep at night, cough becomes productive with sputum that maybe yellow or green in color, contact PCP.  If diarrhea remains the same: encourage patient to drink oral fluids and bland foods.   Avoid alcohol, spicy foods, caffeine or fatty foods that could make diarrhea worse.  Continue to monitor for signs of dehydration (increased thirst decreased urine output, yellow urine, dry skin, headache or dizziness).   Advise patient to try OTC medication (Imodium, kaopectate, Pepto-Bismol) as per manufacturer's instructions.  If worsening diarrhea occurs and becomes severe (6-7 bowel movements a day): notify PCP  If diarrhea last greater than 7 days: notify PCP   IF SIGNS OF DEHYDRATION OCCUR (INCREASED THIRST, DECREASED URINE OUTPUT, YELLOW URINE, DRY SKIN, HEADACHE OR DIZZINESS) ADVISE PATIENT TO CALL 911 AND SEEK TREATMENT IN THE ED  Patient states that he had a appointment with his pcp today and he also went and got tested today. Patient verbalized understanding and agrees with plan.

## 2019-04-25 NOTE — Progress Notes (Signed)
Pt reorts that he has had the cough x 3 days. Began as a pain on R side of his chest and then turned into chest congestion and cough. No SOB his O2 94%  He stated that the cough is not productive. He has not taken any medication for this.  Denies f/s/c/n/v he does report having an upset stomach w/diaherra. No loss of taste or smell. Jackson Sherman, Lahoma Crocker, CMA

## 2019-04-26 ENCOUNTER — Telehealth: Payer: Self-pay

## 2019-04-26 ENCOUNTER — Encounter (INDEPENDENT_AMBULATORY_CARE_PROVIDER_SITE_OTHER): Payer: Self-pay

## 2019-04-26 NOTE — Telephone Encounter (Signed)
Patient advise on weakness and diarrhea per protocol:   IF PATIENT HAS WORSENING WEAKNESS WITH INABILITY TO STAND OR IF PATIENT HAS TO HOLD ON TO SOMETHING TO GET BALANCE, ADVISE PATIENT TO CALL 911 AND SEEK TREATMENT IN ED   If diarrhea remains the same: encourage patient to drink oral fluids and bland foods.   Avoid alcohol, spicy foods, caffeine or fatty foods that could make diarrhea worse.   Continue to monitor for signs of dehydration (increased thirst decreased urine output, yellow urine, dry skin, headache or dizziness).   Advise patient to try OTC medication (Imodium, kaopectate, Pepto-Bismol) as per manufacturer's instructions.   If worsening diarrhea occurs and becomes severe (6-7 bowel movements a day): notify PCP   If diarrhea last greater than 7 days: notify PCP   IF SIGNS OF DEHYDRATION OCCUR (INCREASED THIRST, DECREASED URINE OUTPUT, YELLOW URINE, DRY SKIN, HEADACHE OR DIZZINESS) ADVISE PATIENT TO CALL 911 AND SEEK TREATMENT IN THE ED  Patient will go and get some medication for the diarrhea today. Patient verbalized understanding and agrees with plan.

## 2019-04-27 ENCOUNTER — Encounter (INDEPENDENT_AMBULATORY_CARE_PROVIDER_SITE_OTHER): Payer: Self-pay

## 2019-04-27 LAB — NOVEL CORONAVIRUS, NAA: SARS-CoV-2, NAA: NOT DETECTED

## 2019-04-29 ENCOUNTER — Encounter (INDEPENDENT_AMBULATORY_CARE_PROVIDER_SITE_OTHER): Payer: Self-pay

## 2019-05-15 MED FILL — LOSARTAN POTASSIUM 50 MG TA: 50 | 30 days supply | Qty: 30 | Fill #2

## 2019-06-14 MED FILL — LOSARTAN POTASSIUM 50 MG TA: 50 | 60 days supply | Qty: 60 | Fill #3

## 2019-07-06 MED FILL — busPIRone HCL 15 MG TABS: 15 | 90 days supply | Qty: 180 | Fill #1

## 2019-07-06 MED FILL — traZODone HCL 100 MG TABS: 100 | 90 days supply | Qty: 90 | Fill #1

## 2019-07-12 MED FILL — NALTREXONE 50 MG TABLET: 50 | 90 days supply | Qty: 90 | Fill #1

## 2019-07-12 MED FILL — REXULTI 2 MG TABLET: 2 | 90 days supply | Qty: 90 | Fill #1

## 2019-07-12 MED FILL — ATORVASTATIN 20 MG TABLET: 20 | 90 days supply | Qty: 90 | Fill #2

## 2019-07-26 NOTE — Progress Notes (Signed)
Established Patient Office Visit  Subjective:  Patient ID: Jackson Sherman, male    DOB: 1961/06/26  Age: 59 y.o. MRN: [id number]  CC:  Chief Complaint  Patient presents with  . Hypertension    HPI Jackson Sherman presents for   Hypertension- Pt denies chest pain, SOB, dizziness, or heart palpitations.  Taking meds as directed w/o problems.  Denies medication side effects.     Impaired fasting glucose-no increased thirst or urination. No symptoms consistent with hypoglycemia.  F/U Mood/Depression -overall he feels like his mood is really well controlled. He feels like things are stable and in a good place he is tolerating his medications well and says he is taking them regularly. Feels like sleep is good. He is back at work full-time.   Past Medical History:  Diagnosis Date  . 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 Anxiety   . Depression   . GERD (gastroesophageal reflux disease)   . Hemorrhoids   . Hip arthritis   . Hyperlipidemia   . Hypertension   . Joint pain   . Osteoarthritis   . Seizures ([place])    ?? in August ...more related to ETOH rehab    Past Surgical History:  Procedure Laterality Date  . ARTHROSCOPIC REPAIR ACL     left  . FRACTURE SURGERY     no surgery on broke thumb  . HEMORRHOID SURGERY N/A 11/23/2012   Procedure: HEMORRHOIDECTOMY;  Surgeon: Madilyn Hook, DO;  Location: [facility];  Service: General;  Laterality: N/A;  . JOINT REPLACEMENT Right 12/02/2018   right total hip arthroplasty  . TONSILLECTOMY     Age 38   . TOTAL HIP ARTHROPLASTY Right 12/02/2018   Procedure: TOTAL HIP ARTHROPLASTY ANTERIOR APPROACH;  Surgeon: Dorna Leitz, MD;  Location: [facility];  Service: Orthopedics;  Laterality: Right;  . TOTAL KNEE ARTHROPLASTY Left 06/15/2016   Procedure: LEFT TOTAL KNEE ARTHROPLASTY;  Surgeon: Vickey Huger, MD;  Location: [facility];  Service: Orthopedics;  Laterality: Left;    Family History  Problem Relation Age of Onset  . Heart disease Father    . Heart disease Maternal Grandmother 69  . Stroke Maternal Grandmother   . Diabetes Maternal Grandmother   . Hyperlipidemia Mother   . Hypertension Mother   . Breast cancer Mother 52  . Depression Sister   . Diabetes Sister   . Diabetes Brother   . Diabetes Paternal Grandmother   . Stroke Paternal Grandmother     Social History   Socioeconomic History  . Marital status: Married    Spouse name: Not on file  . Number of children: 4  . Years of education: Not on file  . Highest education level: Not on file  Occupational History  . 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 Smoking status: Former Smoker    Packs/day: 1.00    Years: 15.00    Pack years: 15.00    Types: Cigarettes    Quit date: 06/30/2003    Years since quitting: 16.0  . Smokeless tobacco: Never Used  Substance and Sexual Activity  . Alcohol use: Not Currently  . Drug use: No  . 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 Not on file  Social History Narrative   No regular exercise.  3-4 cups per day of caffeine.     11/30/18- attends [facility] and has lost 33 lbs since starting with them   Social Determinants of Health   Financial Resource Strain:   . Difficulty of Paying Living Expenses: Not on file  Food Insecurity:   . Worried About Charity fundraiser in the Last Year: Not on file  . Ran Out of Food in the Last Year: Not on file  Transportation Needs:   . Lack of Transportation (Medical): Not on file  . Lack of Transportation (Non-Medical): Not on file  Physical Activity:   . Days of Exercise per Week: Not on file  . Minutes of Exercise per Session: Not on file  Stress:   . Feeling of Stress : Not on file  Social Connections:   . Frequency of Communication with  Friends and Family: Not on file  . Frequency of Social Gatherings with Friends and Family: Not on file  . Attends Religious Services: Not on file  . Active Member of Clubs or Organizations: Not on file  . Attends Archivist Meetings: Not on file  . Marital Status: Not on file  Intimate Partner Violence:   . Fear of Current or Ex-Partner: Not on file  . Emotionally Abused: Not on file  . Physically Abused: Not on file  . Sexually Abused: Not on file    Outpatient Medications Prior to Visit  Medication Sig Dispense Refill  . aspirin EC 325 MG tablet Take 1 tablet (325 mg total) by mouth 2 (two) times daily after a meal. Take x 1 month post op to decrease risk of blood clots. 60 tablet 0  . atorvastatin (LIPITOR) 20 MG tablet Take 20 mg by mouth at bedtime 90 tablet 3  . busPIRone (BUSPAR) 15 MG tablet Take 1 tablet (15 mg total) by mouth 2 (two) times daily. 180 tablet 1  . diphenhydrAMINE (BENADRYL) 25 MG tablet Take 25 mg by mouth at bedtime as needed for sleep.     Marland Kitchen docusate sodium (COLACE) 100 MG capsule Take 1 capsule (100 mg total) by mouth 2 (two) times daily. 30 capsule 0  . escitalopram (LEXAPRO) 20 MG tablet Take 1 tablet (20 mg total) by mouth daily. 90 tablet 1  . loratadine (CLARITIN) 10 MG tablet Take 10 mg by mouth daily.    Marland Kitchen losartan (COZAAR) 50 MG tablet TAKE 1 TABLET (50 MG TOTAL) BY MOUTH DAILY. 90 tablet 1  . naltrexone (DEPADE) 50 MG tablet Take 1 tablet (50 mg total) by mouth daily. 90 tablet 1  . REXULTI 2 MG TABS tablet Take 1 tablet (2 mg total) by mouth daily. 180 tablet 1  . sildenafil (REVATIO) 20 MG tablet Take 2-5 tablets (40-100 mg total) by mouth daily as needed. 50 tablet 4  . traZODone (DESYREL) 100 MG tablet Take 1 tablet (100 mg total) by mouth at bedtime. 90 tablet 1  . Vitamin D, Ergocalciferol, (DRISDOL) 1.25 MG (50000 UT) CAPS capsule Take 1 capsule (50,000 Units total) by mouth every 7 (seven) days. 4 capsule 0   No facility-administered  medications prior to visit.    No Known Allergies  ROS Review of Systems    Objective:    Physical Exam  Constitutional: He is oriented to person, place, and time. He appears well-developed and well-nourished.  HENT:  Head: Normocephalic and atraumatic.  Cardiovascular: Normal rate, regular rhythm and normal heart sounds.  Pulmonary/Chest: Effort normal and  breath sounds normal.  Neurological: He is alert and oriented to person, place, and time.  Skin: Skin is warm and dry.  Psychiatric: He has a normal mood and affect. His behavior is normal.    BP (!) 135/95   Pulse (!) 109   Ht 5\' 9"  (1.753 m)   Wt 245 lb (111.1 kg)   SpO2 97%   BMI 36.18 kg/m  Wt Readings from Last 3 Encounters:  07/27/19 245 lb (111.1 kg)  03/29/19 233 lb (105.7 kg)  12/21/18 225 lb (102.1 kg)     There are no preventive care reminders to display for this patient.  There are no preventive care reminders to display for this patient.  Lab Results  Component Value Date   TSH 1.430 08/30/2018   Lab Results  Component Value Date   WBC 16.8 (H) 12/03/2018   HGB 12.7 (L) 12/03/2018   HCT 40.9 12/03/2018   MCV 90.5 12/03/2018   PLT 208 12/03/2018   Lab Results  Component Value Date   NA 138 11/24/2018   K 4.1 11/24/2018   CO2 26 11/24/2018   GLUCOSE 99 11/24/2018   BUN 21 (H) 11/24/2018   CREATININE 1.02 11/24/2018   BILITOT 0.4 11/24/2018   ALKPHOS 85 11/24/2018   AST 20 11/24/2018   ALT 23 11/24/2018   PROT 7.2 11/24/2018   ALBUMIN 4.0 11/24/2018   CALCIUM 8.7 (L) 11/24/2018   ANIONGAP 6 11/24/2018   Lab Results  Component Value Date   CHOL 168 08/30/2018   Lab Results  Component Value Date   HDL 42 08/30/2018   Lab Results  Component Value Date   LDLCALC 105 (H) 08/30/2018   Lab Results  Component Value Date   TRIG 104 08/30/2018   Lab Results  Component Value Date   CHOLHDL 5.4 (H) 02/15/2018   Lab Results  Component Value Date   HGBA1C 5.2 11/24/2018       Assessment & Plan:   Problem List Items Addressed This Visit      Cardiovascular and Mediastinum   Essential hypertension - Primary    Blood pressure elevated today particularly the diastolic. But he says he didn't take his medicine before he came in today he plans to go home and take it. Normally his blood pressure looks fantastic when he is on his medication. We'll get updated labs. Otherwise follow-up in 6 months.      Relevant Orders   CBC   COMPLETE METABOLIC PANEL WITH GFR   Lipid panel     Endocrine   Insulin resistance    Due for A1c.      Relevant Orders   CBC   COMPLETE METABOLIC PANEL WITH GFR   Lipid panel   Hemoglobin A1c     Other   Unipolar depression ([place])    Continue current regimen with Rexulti and Lexapro. Happy with current regimen. Also with BuSpar for anxiety. I do think he has a good balance regimen at this point.      Insomnia    Doing well on trazodone.  Continue current regimen.  Will be due for refills next month.      Alcohol dependence ([place])    Continue with naltrexone.         No orders of the defined types were placed in this encounter.   Follow-up: Return in about 6 months (around 01/24/2020).    Beatrice Lecher, MD

## 2019-07-27 ENCOUNTER — Ambulatory Visit (INDEPENDENT_AMBULATORY_CARE_PROVIDER_SITE_OTHER): Payer: No Typology Code available for payment source | Admitting: Family Medicine

## 2019-07-27 ENCOUNTER — Encounter: Payer: Self-pay | Admitting: Family Medicine

## 2019-07-27 ENCOUNTER — Other Ambulatory Visit: Payer: Self-pay

## 2019-07-27 VITALS — BP 135/95 | HR 109 | Ht 69.0 in | Wt 245.0 lb

## 2019-07-27 DIAGNOSIS — E8881 Metabolic syndrome: Secondary | ICD-10-CM

## 2019-07-27 DIAGNOSIS — I10 Essential (primary) hypertension: Secondary | ICD-10-CM | POA: Diagnosis not present

## 2019-07-27 DIAGNOSIS — F1021 Alcohol dependence, in remission: Secondary | ICD-10-CM

## 2019-07-27 DIAGNOSIS — F339 Major depressive disorder, recurrent, unspecified: Secondary | ICD-10-CM

## 2019-07-27 DIAGNOSIS — F5101 Primary insomnia: Secondary | ICD-10-CM

## 2019-07-27 NOTE — Assessment & Plan Note (Signed)
Blood pressure elevated today particularly the diastolic. But he says he didn't take his medicine before he came in today he plans to go home and take it. Normally his blood pressure looks fantastic when he is on his medication. We'll get updated labs. Otherwise follow-up in 6 months.

## 2019-07-27 NOTE — Assessment & Plan Note (Signed)
Due for A1c 

## 2019-07-27 NOTE — Assessment & Plan Note (Signed)
Continue current regimen with Rexulti and Lexapro. Happy with current regimen. Also with BuSpar for anxiety. I do think he has a good balance regimen at this point.

## 2019-07-27 NOTE — Patient Instructions (Signed)
F/U in 2 weeks for nurse visit for 2nd shingles vaccine.

## 2019-07-27 NOTE — Assessment & Plan Note (Signed)
Continue with naltrexone.

## 2019-07-27 NOTE — Assessment & Plan Note (Signed)
Doing well on trazodone.  Continue current regimen.  Will be due for refills next month.

## 2019-08-10 ENCOUNTER — Ambulatory Visit: Payer: No Typology Code available for payment source | Admitting: Family Medicine

## 2019-08-10 MED FILL — ESCITALOPRAM 20 MG TABLET: 20 | 90 days supply | Qty: 90 | Fill #1

## 2019-08-10 NOTE — Progress Notes (Deleted)
Patient no showed for nurse appt.

## 2019-08-15 ENCOUNTER — Ambulatory Visit (INDEPENDENT_AMBULATORY_CARE_PROVIDER_SITE_OTHER): Payer: No Typology Code available for payment source | Admitting: Family Medicine

## 2019-08-15 VITALS — Temp 98.2°F | Ht 69.0 in | Wt 253.0 lb

## 2019-08-15 DIAGNOSIS — Z23 Encounter for immunization: Secondary | ICD-10-CM

## 2019-08-15 NOTE — Progress Notes (Signed)
Patient presents to clinic for his second shingles vaccine. He reports that he tolerated the first injection well with no complications. He tolerated the injection well in his left deltoid with no immediate complications.

## 2019-08-21 MED FILL — LOSARTAN POTASSIUM 50 MG TA: 50 | 30 days supply | Qty: 30 | Fill #4

## 2019-09-19 MED FILL — AMOXICILLIN 500 MG CAPSULE: 500 | 7 days supply | Qty: 21 | Fill #0

## 2019-09-27 ENCOUNTER — Other Ambulatory Visit: Payer: Self-pay | Admitting: Family Medicine

## 2019-09-27 MED FILL — LOSARTAN POTASSIUM 50 MG TA: 50 | 30 days supply | Qty: 30 | Fill #0

## 2019-10-19 ENCOUNTER — Other Ambulatory Visit: Payer: Self-pay | Admitting: Family Medicine

## 2019-10-19 DIAGNOSIS — F329 Major depressive disorder, single episode, unspecified: Secondary | ICD-10-CM

## 2019-10-19 DIAGNOSIS — F32A Depression, unspecified: Secondary | ICD-10-CM

## 2019-10-19 DIAGNOSIS — G4700 Insomnia, unspecified: Secondary | ICD-10-CM

## 2019-10-19 MED FILL — REXULTI 2 MG TABLET: 2 | 90 days supply | Qty: 90 | Fill #2

## 2019-10-20 MED FILL — busPIRone HCL 15 MG TABS: 15 | 90 days supply | Qty: 180 | Fill #0

## 2019-10-20 MED FILL — traZODone HCL 100 MG TABS: 100 | 90 days supply | Qty: 90 | Fill #0

## 2019-10-31 ENCOUNTER — Other Ambulatory Visit: Payer: Self-pay | Admitting: Family Medicine

## 2019-10-31 MED FILL — ATORVASTATIN 20 MG TABLET: 20 | 90 days supply | Qty: 90 | Fill #3

## 2019-11-01 MED FILL — LOSARTAN POTASSIUM 50 MG TA: 50 | 15 days supply | Qty: 15 | Fill #0

## 2019-11-15 ENCOUNTER — Other Ambulatory Visit: Payer: Self-pay | Admitting: Family Medicine

## 2019-11-15 DIAGNOSIS — F1010 Alcohol abuse, uncomplicated: Secondary | ICD-10-CM

## 2019-11-15 LAB — LIPID PANEL
Cholesterol: 168 mg/dL (ref <200)
HDL: 50 mg/dL (ref >=40)
LDL Cholesterol (Calc): 97 mg/dL (calc)
Non-HDL Cholesterol (Calc): 118 mg/dL (calc) (ref <130)
Total CHOL/HDL Ratio: 3.4 (calc) (ref <5.0)
Triglycerides: 113 mg/dL (ref <150)

## 2019-11-15 LAB — HEMOGLOBIN A1C
Hgb A1c MFr Bld: 5.4 % of total Hgb (ref <5.7)
Mean Plasma Glucose: 108 (calc)
eAG (mmol/L): 6 (calc)

## 2019-11-15 LAB — CBC
HCT: 44.8 % (ref 38.5–50.0)
Hemoglobin: 15.3 g/dL (ref 13.2–17.1)
MCH: 29.8 pg (ref 27.0–33.0)
MCHC: 34.2 g/dL (ref 32.0–36.0)
MCV: 87.3 fL (ref 80.0–100.0)
MPV: 12.2 fL (ref 7.5–12.5)
Platelets: 276 10*3/uL (ref 140–400)
RBC: 5.13 10*6/uL (ref 4.20–5.80)
RDW: 12.4 % (ref 11.0–15.0)
WBC: 10.6 10*3/uL (ref 3.8–10.8)

## 2019-11-15 LAB — COMPLETE METABOLIC PANEL WITH GFR
AG Ratio: 1.2 (calc) (ref 1.0–2.5)
ALT: 19 U/L (ref 9–46)
AST: 15 U/L (ref 10–35)
Albumin: 3.8 g/dL (ref 3.6–5.1)
Alkaline phosphatase (APISO): 80 U/L (ref 35–144)
BUN: 20 mg/dL (ref 7–25)
CO2: 28 mmol/L (ref 20–32)
Calcium: 9 mg/dL (ref 8.6–10.3)
Chloride: 102 mmol/L (ref 98–110)
Creat: 1.08 mg/dL (ref 0.70–1.25)
GFR, Est African American: 86 mL/min/{1.73_m2} (ref >=60)
GFR, Est Non African American: 74 mL/min/{1.73_m2} (ref >=60)
Globulin: 3.1 g/dL (calc) (ref 1.9–3.7)
Glucose, Bld: 89 mg/dL (ref 65–99)
Potassium: 4.7 mmol/L (ref 3.5–5.3)
Sodium: 137 mmol/L (ref 135–146)
Total Bilirubin: 0.5 mg/dL (ref 0.2–1.2)
Total Protein: 6.9 g/dL (ref 6.1–8.1)

## 2019-11-15 MED FILL — NALTREXONE 50 MG TABLET: 50 | 90 days supply | Qty: 90 | Fill #0

## 2019-11-15 MED FILL — ESCITALOPRAM 20 MG TABLET: 20 | 30 days supply | Qty: 30 | Fill #0

## 2019-11-15 MED FILL — LOSARTAN POTASSIUM 50 MG TA: 50 | 90 days supply | Qty: 90 | Fill #0

## 2019-12-12 ENCOUNTER — Other Ambulatory Visit: Payer: Self-pay

## 2019-12-12 ENCOUNTER — Encounter: Payer: Self-pay | Admitting: Family Medicine

## 2019-12-12 ENCOUNTER — Ambulatory Visit (INDEPENDENT_AMBULATORY_CARE_PROVIDER_SITE_OTHER): Payer: No Typology Code available for payment source | Admitting: Family Medicine

## 2019-12-12 DIAGNOSIS — M25561 Pain in right knee: Secondary | ICD-10-CM | POA: Diagnosis not present

## 2019-12-12 MED ORDER — INDOMETHACIN 50 MG PO CAPS: 50.0000 mg | ORAL_CAPSULE | Freq: Three times a day (TID) | ORAL | 1 refills | Status: DC

## 2019-12-12 MED FILL — INDOMETHACIN 50 MG CAPSULE: 50 | 10 days supply | Qty: 30 | Fill #0

## 2019-12-12 NOTE — Assessment & Plan Note (Signed)
Right knee pain consistent with gout.  DDx also includes septic joint although I wouldn't expect this to spontaneously improve. He has history of elevated uric acid levels as well.  Will treat this with indomethacin 50mg  tid as needed until resolution.  He is instructed to contact clinic if he develops any new or worsening symptoms.

## 2019-12-12 NOTE — Progress Notes (Signed)
Patient had [employer] COVID vaccine in February. Will bring card to next visit.

## 2019-12-12 NOTE — Patient Instructions (Signed)
 Gout  Gout is painful swelling of your joints. Gout is a type of arthritis. It is caused by having too much uric acid in your body. Uric acid is a chemical that is made when your body breaks down substances called purines. If your body has too much uric acid, sharp crystals can form and build up in your joints. This causes pain and swelling. Gout attacks can happen quickly and be very painful (acute gout). Over time, the attacks can affect more joints and happen more often (chronic gout). What are the causes?  Too much uric acid in your blood. This can happen because: ? Your kidneys do not remove enough uric acid from your blood. ? Your body makes too much uric acid. ? You eat too many foods that are high in purines. These foods include organ meats, some seafood, and beer.  Trauma or stress. What increases the risk?  Having a family history of gout.  Being male and middle-aged.  Being male and having gone through menopause.  Being very overweight (obese).  Drinking alcohol, especially beer.  Not having enough water in the body (being dehydrated).  Losing weight too quickly.  Having an organ transplant.  Having lead poisoning.  Taking certain medicines.  Having kidney disease.  Having a skin condition called psoriasis. What are the signs or symptoms? An attack of acute gout usually happens in just one joint. The most common place is the big toe. Attacks often start at night. Other joints that may be affected include joints of the feet, ankle, knee, fingers, wrist, or elbow. Symptoms of an attack may include:  Very bad pain.  Warmth.  Swelling.  Stiffness.  Shiny, red, or purple skin.  Tenderness. The affected joint may be very painful to touch.  Chills and fever. Chronic gout may cause symptoms more often. More joints may be involved. You may also have white or yellow lumps (tophi) on your hands or feet or in other areas near your joints. How is this  treated?  Treatment for this condition has two phases: treating an acute attack and preventing future attacks.  Acute gout treatment may include: ? NSAIDs. ? Steroids. These are taken by mouth or injected into a joint. ? Colchicine. This medicine relieves pain and swelling. It can be given by mouth or through an IV tube.  Preventive treatment may include: ? Taking small doses of NSAIDs or colchicine daily. ? Using a medicine that reduces uric acid levels in your blood. ? Making changes to your diet. You may need to see a food expert (dietitian) about what to eat and drink to prevent gout. Follow these instructions at home: During a gout attack   If told, put ice on the painful area: ? Put ice in a plastic bag. ? Place a towel between your skin and the bag. ? Leave the ice on for 20 minutes, 2-3 times a day.  Raise (elevate) the painful joint above the level of your heart as often as you can.  Rest the joint as much as possible. If the joint is in your leg, you may be given crutches.  Follow instructions from your doctor about what you cannot eat or drink. Avoiding future gout attacks  Eat a low-purine diet. Avoid foods and drinks such as: ? Liver. ? Kidney. ? Anchovies. ? Asparagus. ? Herring. ? Mushrooms. ? Mussels. ? Beer.  Stay at a healthy weight. If you want to lose weight, talk with your doctor. Do not lose   weight too fast.  Start or continue an exercise plan as told by your doctor. Eating and drinking  Drink enough fluids to keep your pee (urine) pale yellow.  If you drink alcohol: ? Limit how much you use to:  0-1 drink a day for women.  0-2 drinks a day for men. ? Be aware of how much alcohol is in your drink. In the [place] one drink equals one 12 oz bottle of beer (355 mL), one 5 oz glass of wine (148 mL), or one 1 oz glass of hard liquor (44 mL). General instructions  Take over-the-counter and prescription medicines only as told by your doctor.  Do  not drive or use heavy machinery while taking prescription pain medicine.  Return to your normal activities as told by your doctor. Ask your doctor what activities are safe for you.  Keep all follow-up visits as told by your doctor. This is important. Contact a doctor if:  You have another gout attack.  You still have symptoms of a gout attack after 10 days of treatment.  You have problems (side effects) because of your medicines.  You have chills or a fever.  You have burning pain when you pee (urinate).  You have pain in your lower back or belly. Get help right away if:  You have very bad pain.  Your pain cannot be controlled.  You cannot pee. Summary  Gout is painful swelling of the joints.  The most common site of pain is the big toe, but it can affect other joints.  Medicines and avoiding some foods can help to prevent and treat gout attacks. This information is not intended to replace advice given to you by your health care provider. Make sure you discuss any questions you have with your health care provider. Document Revised: 01/05/2018 Document Reviewed: 01/05/2018 Elsevier Patient Education  2020 [employer]  

## 2019-12-12 NOTE — Progress Notes (Signed)
Jackson Sherman - 59 y.o. male MRN [id number]  Date of birth: 1961/03/11  Subjective Chief Complaint  Patient presents with  . knee inflammation    HPI Jackson Sherman is a 59 y.o. male with history of elevated uric acid and EtOH dependence here today with complaint of R knee pain.  Reports that symptoms started about 1 week ago when she started having pain with swelling, warmth and redness.  This improved some over the weekend and then worsened again a couple of days ago.  It has improved again today.  He denies fever, chills, or increased fatigue.   ROS:  A comprehensive ROS was completed and negative except as noted per HPI   No Known Allergies  Past Medical History:  Diagnosis Date  . 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 Anxiety   . Depression   . GERD (gastroesophageal reflux disease)   . Hemorrhoids   . Hip arthritis   . Hyperlipidemia   . Hypertension   . Joint pain   . Osteoarthritis   . Seizures ([place])    ?? in August ...more related to ETOH rehab    Past Surgical History:  Procedure Laterality Date  . ARTHROSCOPIC REPAIR ACL     left  . FRACTURE SURGERY     no surgery on broke thumb  . HEMORRHOID SURGERY N/A 11/23/2012   Procedure: HEMORRHOIDECTOMY;  Surgeon: Madilyn Hook, DO;  Location: [facility];  Service: General;  Laterality: N/A;  . JOINT REPLACEMENT Right 12/02/2018   right total hip arthroplasty  . TONSILLECTOMY     Age 58   . TOTAL HIP ARTHROPLASTY Right 12/02/2018   Procedure: TOTAL HIP ARTHROPLASTY ANTERIOR APPROACH;  Surgeon: Dorna Leitz, MD;  Location: [facility];  Service: Orthopedics;  Laterality: Right;  . TOTAL KNEE ARTHROPLASTY Left 06/15/2016   Procedure: LEFT TOTAL KNEE ARTHROPLASTY;  Surgeon: Vickey Huger, MD;  Location: [facility];  Service: Orthopedics;  Laterality: Left;    Social History   Socioeconomic History  . Marital status: Married    Spouse name: Not on file  . Number of children: 4  . Years of education: Not on file  . Highest  education level: Not on file  Occupational History  . Occupation: retired [employer], current Financial planner: [employer]  Tobacco Use  . Smoking status: Former Smoker    Packs/day: 1.00    Years: 15.00    Pack years: 15.00    Types: Cigarettes    Quit date: 06/30/2003    Years since quitting: 16.4  . Smokeless tobacco: Never Used  Vaping Use  . Vaping Use: Never used  Substance and Sexual Activity  . Alcohol use: Not Currently  . Drug use: No  . 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 Not on file  Social History Narrative   No regular exercise.  3-4 cups per day of caffeine.     11/30/18- attends [facility] and has lost 33 lbs since starting with them   Social Determinants of Health   Financial Resource Strain:   . Difficulty of Paying Living Expenses:   Food Insecurity:   . Worried About Charity fundraiser in the Last Year:   . Arboriculturist in the Last Year:   Transportation Needs:   .  Lack of Transportation (Medical):   Marland Kitchen Lack of Transportation (Non-Medical):   Physical Activity:   . Days of Exercise per Week:   . Minutes of Exercise per Session:   Stress:   . Feeling of Stress :   Social Connections:   . Frequency of Communication with Friends and Family:   . Frequency of Social Gatherings with Friends and Family:   . Attends Religious Services:   . Active Member of Clubs or Organizations:   . Attends Archivist Meetings:   Marland Kitchen Marital Status:     Family History  Problem Relation Age of Onset  . Heart disease Father   . Heart disease Maternal Grandmother 80  . Stroke Maternal Grandmother   . Diabetes Maternal Grandmother   . Hyperlipidemia Mother   . Hypertension Mother   . Breast cancer Mother 2  . Depression Sister   . Diabetes Sister   . Diabetes  Brother   . Diabetes Paternal Grandmother   . Stroke Paternal Grandmother     Health Maintenance  Topic Date Due  . COVID-19 Vaccine (1) 01/28/2020 (Originally 11/08/1972)  . INFLUENZA VACCINE  01/28/2020  . COLONOSCOPY  06/13/2023  . TETANUS/TDAP  09/23/2025  . Hepatitis C Screening  Completed  . HIV Screening  Completed     ----------------------------------------------------------------------------------------------------------------------------------------------------------------------------------------------------------------- Physical Exam BP 124/84 (BP Location: Left Arm, Patient Position: Sitting, Cuff Size: Large)   Pulse 77   Temp 97.9 F (36.6 C) (Oral)   Ht 5' 8.9" (1.75 m)   Wt 270 lb 3.2 oz (122.6 kg)   BMI 40.02 kg/m   Physical Exam Constitutional:      Appearance: Normal appearance.  HENT:     Head: Normocephalic and atraumatic.  Eyes:     General: No scleral icterus. Cardiovascular:     Rate and Rhythm: Normal rate and regular rhythm.  Pulmonary:     Effort: Pulmonary effort is normal.     Breath sounds: Normal breath sounds.  Musculoskeletal:     Cervical back: Neck supple.  Skin:    General: Skin is warm.  Neurological:     General: No focal deficit present.     Mental Status: He is alert.     ------------------------------------------------------------------------------------------------------------------------------------------------------------------------------------------------------------------- Assessment and Plan  Right knee pain Right knee pain consistent with gout.  DDx also includes septic joint although I wouldn't expect this to spontaneously improve. He has history of elevated uric acid levels as well.  Will treat this with indomethacin 50mg  tid as needed until resolution.  He is instructed to contact clinic if he develops any new or worsening symptoms.      Meds ordered this encounter  Medications  . indomethacin (INDOCIN)  50 MG capsule    Sig: Take 1 capsule (50 mg total) by mouth 3 (three) times daily with meals.    Dispense:  30 capsule    Refill:  1    No follow-ups on file.    This visit occurred during the SARS-CoV-2 public health emergency.  Safety protocols were in place, including screening questions prior to the visit, additional usage of staff PPE, and extensive cleaning of exam room while observing appropriate contact time as indicated for disinfecting solutions.

## 2019-12-20 MED FILL — ESCITALOPRAM 20 MG TABLET: 20 | 30 days supply | Qty: 30 | Fill #0

## 2019-12-29 ENCOUNTER — Inpatient Hospital Stay (HOSPITAL_BASED_OUTPATIENT_CLINIC_OR_DEPARTMENT_OTHER)
Admission: EM | Admit: 2019-12-29 | Discharge: 2020-01-01 | DRG: 175 | Disposition: A | Discharge status: Home / Self Care | Payer: No Typology Code available for payment source | Attending: Internal Medicine | Admitting: Internal Medicine

## 2019-12-29 ENCOUNTER — Other Ambulatory Visit: Payer: Self-pay

## 2019-12-29 ENCOUNTER — Emergency Department (HOSPITAL_BASED_OUTPATIENT_CLINIC_OR_DEPARTMENT_OTHER): Payer: No Typology Code available for payment source

## 2019-12-29 ENCOUNTER — Encounter (HOSPITAL_BASED_OUTPATIENT_CLINIC_OR_DEPARTMENT_OTHER): Payer: Self-pay | Admitting: *Deleted

## 2019-12-29 DIAGNOSIS — J9601 Acute respiratory failure with hypoxia: Secondary | ICD-10-CM | POA: Diagnosis present

## 2019-12-29 DIAGNOSIS — I2699 Other pulmonary embolism without acute cor pulmonale: Secondary | ICD-10-CM | POA: Diagnosis present

## 2019-12-29 DIAGNOSIS — N179 Acute kidney failure, unspecified: Secondary | ICD-10-CM | POA: Diagnosis present

## 2019-12-29 DIAGNOSIS — I10 Essential (primary) hypertension: Secondary | ICD-10-CM | POA: Diagnosis present

## 2019-12-29 DIAGNOSIS — Z8249 Family history of ischemic heart disease and other diseases of the circulatory system: Secondary | ICD-10-CM

## 2019-12-29 DIAGNOSIS — I2693 Single subsegmental pulmonary embolism without acute cor pulmonale: Principal | ICD-10-CM | POA: Diagnosis present

## 2019-12-29 DIAGNOSIS — K219 Gastro-esophageal reflux disease without esophagitis: Secondary | ICD-10-CM | POA: Diagnosis present

## 2019-12-29 DIAGNOSIS — J9621 Acute and chronic respiratory failure with hypoxia: Secondary | ICD-10-CM | POA: Diagnosis not present

## 2019-12-29 DIAGNOSIS — I340 Nonrheumatic mitral (valve) insufficiency: Secondary | ICD-10-CM | POA: Diagnosis not present

## 2019-12-29 DIAGNOSIS — Z79899 Other long term (current) drug therapy: Secondary | ICD-10-CM | POA: Diagnosis not present

## 2019-12-29 DIAGNOSIS — E785 Hyperlipidemia, unspecified: Secondary | ICD-10-CM | POA: Diagnosis present

## 2019-12-29 DIAGNOSIS — Z87891 Personal history of nicotine dependence: Secondary | ICD-10-CM

## 2019-12-29 DIAGNOSIS — M7989 Other specified soft tissue disorders: Secondary | ICD-10-CM | POA: Diagnosis not present

## 2019-12-29 DIAGNOSIS — Z20822 Contact with and (suspected) exposure to covid-19: Secondary | ICD-10-CM | POA: Diagnosis present

## 2019-12-29 DIAGNOSIS — F419 Anxiety disorder, unspecified: Secondary | ICD-10-CM

## 2019-12-29 DIAGNOSIS — Z6841 Body Mass Index (BMI) 40.0 and over, adult: Secondary | ICD-10-CM | POA: Diagnosis present

## 2019-12-29 DIAGNOSIS — Z6838 Body mass index (BMI) 38.0-38.9, adult: Secondary | ICD-10-CM

## 2019-12-29 DIAGNOSIS — R071 Chest pain on breathing: Secondary | ICD-10-CM

## 2019-12-29 DIAGNOSIS — F339 Major depressive disorder, recurrent, unspecified: Secondary | ICD-10-CM

## 2019-12-29 DIAGNOSIS — R079 Chest pain, unspecified: Secondary | ICD-10-CM

## 2019-12-29 DIAGNOSIS — F329 Major depressive disorder, single episode, unspecified: Secondary | ICD-10-CM | POA: Diagnosis present

## 2019-12-29 DIAGNOSIS — F411 Generalized anxiety disorder: Secondary | ICD-10-CM | POA: Diagnosis present

## 2019-12-29 LAB — COMPREHENSIVE METABOLIC PANEL
ALT: 22 U/L (ref 0–44)
AST: 21 U/L (ref 15–41)
Albumin: 3.9 g/dL (ref 3.5–5.0)
Alkaline Phosphatase: 92 U/L (ref 38–126)
Anion gap: 13 (ref 5–15)
BUN: 21 mg/dL — ABNORMAL HIGH (ref 6–20)
CO2: 21 mmol/L — ABNORMAL LOW (ref 22–32)
Calcium: 8.6 mg/dL — ABNORMAL LOW (ref 8.9–10.3)
Chloride: 100 mmol/L (ref 98–111)
Creatinine, Ser: 1.25 mg/dL — ABNORMAL HIGH (ref 0.61–1.24)
GFR calc Af Amer: >60 mL/min (ref >60)
GFR calc non Af Amer: >60 mL/min (ref >60)
Glucose, Bld: 131 mg/dL — ABNORMAL HIGH (ref 70–99)
Potassium: 3.7 mmol/L (ref 3.5–5.1)
Sodium: 134 mmol/L — ABNORMAL LOW (ref 135–145)
Total Bilirubin: 0.9 mg/dL (ref 0.3–1.2)
Total Protein: 8.2 g/dL — ABNORMAL HIGH (ref 6.5–8.1)

## 2019-12-29 LAB — SARS CORONAVIRUS 2 BY RT PCR (HOSPITAL ORDER, PERFORMED IN ~~LOC~~ HOSPITAL LAB): SARS Coronavirus 2: NEGATIVE

## 2019-12-29 LAB — CBC WITH DIFFERENTIAL/PLATELET
Abs Immature Granulocytes: 0.08 10*3/uL — ABNORMAL HIGH (ref 0.00–0.07)
Basophils Absolute: 0.1 10*3/uL (ref 0.0–0.1)
Basophils Relative: 1 %
Eosinophils Absolute: 0.2 10*3/uL (ref 0.0–0.5)
Eosinophils Relative: 1 %
HCT: 46.4 % (ref 39.0–52.0)
Hemoglobin: 16.3 g/dL (ref 13.0–17.0)
Immature Granulocytes: 1 %
Lymphocytes Relative: 21 %
Lymphs Abs: 3.8 10*3/uL (ref 0.7–4.0)
MCH: 30.1 pg (ref 26.0–34.0)
MCHC: 35.1 g/dL (ref 30.0–36.0)
MCV: 85.6 fL (ref 80.0–100.0)
Monocytes Absolute: 1.4 10*3/uL — ABNORMAL HIGH (ref 0.1–1.0)
Monocytes Relative: 8 %
Neutro Abs: 12.2 10*3/uL — ABNORMAL HIGH (ref 1.7–7.7)
Neutrophils Relative %: 68 %
Platelets: 321 10*3/uL (ref 150–400)
RBC: 5.42 MIL/uL (ref 4.22–5.81)
RDW: 12.4 % (ref 11.5–15.5)
WBC: 17.7 10*3/uL — ABNORMAL HIGH (ref 4.0–10.5)
nRBC: 0 % (ref 0.0–0.2)

## 2019-12-29 LAB — ANTITHROMBIN III: AntiThromb III Func: 96 % (ref 75–120)

## 2019-12-29 LAB — TROPONIN I (HIGH SENSITIVITY): Troponin I (High Sensitivity): 12 ng/L (ref <18)

## 2019-12-29 LAB — BRAIN NATRIURETIC PEPTIDE: B Natriuretic Peptide: 36.5 pg/mL (ref 0.0–100.0)

## 2019-12-29 MED ORDER — IOHEXOL 350 MG/ML SOLN
100.0000 mL | Freq: Once | INTRAVENOUS | Status: AC | PRN
  Administered 2019-12-29: 78 mL via INTRAVENOUS

## 2019-12-29 MED ORDER — HEPARIN BOLUS VIA INFUSION
7000.0000 [IU] | Freq: Once | INTRAVENOUS | Status: AC
  Administered 2019-12-29: 7000 [IU] via INTRAVENOUS

## 2019-12-29 MED ORDER — HEPARIN (PORCINE) 25000 UT/250ML-% IV SOLN
1500.0000 [IU]/h | INTRAVENOUS | Status: AC
  Administered 2019-12-29: 1650 [IU]/h via INTRAVENOUS
  Administered 2019-12-30: 1500 [IU]/h via INTRAVENOUS
  Filled 2019-12-29 (×2): qty 250

## 2019-12-29 NOTE — Care Plan (Signed)
Called by EDP Dr. Gilford Raid regarding Jackson Sherman.   Patient presented with SOB X 2 days, chest pain with exertion, palpitations, decreased pulse Ox.  HR in the 130s.  He has bilateral PE on CT scan of the chest.  Works for [employer].  2L of oxygen.  Mid 90s.  No right heart strain on CT scan.  He has been started on IV heparin.  Ordered hypercoagulable profile prior to heparin being started.   Accepted to Progressive bed at [facility] given elevated HR and chance for decompensation.    Gilles Chiquito, MD

## 2019-12-29 NOTE — ED Triage Notes (Signed)
Pt reports SOB, chest pain, with exertion x 2 days , o2 sat 86% .

## 2019-12-29 NOTE — H&P (Signed)
History and Physical  ETHRIDGE SOLLENBERGER AYT:[id number] DOB: 06/21/1961 DOA: 12/29/2019  Referring physician: Dr. Gilford Raid PCP: [name], MD  Patient coming from: [facility]  Chief Complaint: Chest pain and shortness of breath  HPI: Jackson Sherman is a 59 y.o. male with medical history significant for hypertension, hyperlipidemia, depression and generalized anxiety disorder who presents to [facility] ED today due to chest pain and worsening shortness of breath for 3-4 days, chest pain was located in right sternal area and worsens with deep breath.  Patient states that he was short of breath when he walked to his driveway this morning, so he checked his O2 sat with his wife's pulse oximeter and noted it to be in the 80s, so EMS was activated and patient was taken to the ED for further evaluation and management.  Patient states that he has had a sedentary lifestyle within last 6 to 7 months.  He denies fever, chills, headache, nausea, vomiting, abdominal pain, recent travel or contact with any sick person.  ED Course:  In the emergency department, he was tachycardic, tachypneic and hypoxic with O2 sat of 86 on room air, this improved to 95% on supplemental oxygen at 2 LPM via [place].  Work-up in the ED showed leukocytosis, BUN to creatinine 24/1.25 (baseline at 0.99 - 1.08).  SARS coronavirus was negative.  CT angiogram PE of the chest with contrast showed multiple right pulmonary emboli within the central pulmonary arteries and branches in all lobes of the right lung.  Small subsegmental left lower lobe pulmonary embolus with no evidence of right heart strain noted.  He was started on IV heparin drip and patient was transferred to this hospital for further evaluation and management.  Review of Systems: Constitutional: Negative for chills and fever.  HENT: Negative for ear pain and sore throat.   Eyes: Negative for pain and visual disturbance.  Respiratory: Positive for shortness of breath.  Negative  for cough, chest tightness  Cardiovascular: Negative for chest pain and palpitations.  Gastrointestinal: Negative for abdominal pain and vomiting.  Endocrine: Negative for polyphagia and polyuria.  Genitourinary: Negative for decreased urine volume, dysuria, enuresis, hematuria, vaginal discharge and vaginal pain.  Musculoskeletal: Negative for arthralgias and back pain.  Skin: Negative for color change and rash.  Allergic/Immunologic: Negative for immunocompromised state.  Neurological: Negative for tremors, syncope, speech difficulty, weakness, light-headedness and headaches.  Hematological: Does not bruise/bleed easily.  All other systems reviewed and are negative   Past Medical History:  Diagnosis Date  . 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 Anxiety   . Depression   . GERD (gastroesophageal reflux disease)   . Hemorrhoids   . Hip arthritis   . Hyperlipidemia   . Hypertension   . Joint pain   . Osteoarthritis   . Seizures ([place])    ?? in August ...more related to ETOH rehab   Past Surgical History:  Procedure Laterality Date  . ARTHROSCOPIC REPAIR ACL     left  . FRACTURE SURGERY     no surgery on broke thumb  . HEMORRHOID SURGERY N/A 11/23/2012   Procedure: HEMORRHOIDECTOMY;  Surgeon: Madilyn Hook, DO;  Location: [facility];  Service: General;  Laterality: N/A;  . JOINT REPLACEMENT Right 12/02/2018   right total hip arthroplasty  . TONSILLECTOMY     Age 55   . TOTAL HIP ARTHROPLASTY Right 12/02/2018   Procedure: TOTAL HIP ARTHROPLASTY ANTERIOR APPROACH;  Surgeon: Dorna Leitz, MD;  Location: [facility];  Service: Orthopedics;  Laterality:  Right;  Marland Kitchen TOTAL KNEE ARTHROPLASTY Left 06/15/2016   Procedure: LEFT TOTAL KNEE ARTHROPLASTY;  Surgeon: Vickey Huger, MD;  Location: [facility];  Service: Orthopedics;  Laterality: Left;    Social History:  reports that he quit smoking about 16 years ago. His smoking use included cigarettes. He has a 15.00 pack-year smoking history. He has never  used smokeless tobacco. He reports previous alcohol use. He reports that he does not use drugs.   No Known Allergies  Family History  Problem Relation Age of Onset  . Heart disease Father   . Heart disease Maternal Grandmother 48  . Stroke Maternal Grandmother   . Diabetes Maternal Grandmother   . Hyperlipidemia Mother   . Hypertension Mother   . Breast cancer Mother 51  . Depression Sister   . Diabetes Sister   . Diabetes Brother   . Diabetes Paternal Grandmother   . Stroke Paternal Grandmother      Prior to Admission medications   Medication Sig Start Date End Date Taking? Authorizing Provider  atorvastatin (LIPITOR) 20 MG tablet Take 20 mg by mouth at bedtime 12/22/18  Yes [name], MD  busPIRone (BUSPAR) 15 MG tablet TAKE 1 TABLET (15 MG TOTAL) BY MOUTH 2 (TWO) TIMES DAILY. 10/19/19  Yes [name], MD  diphenhydrAMINE (BENADRYL) 25 MG tablet Take 25 mg by mouth at bedtime as needed for sleep.    Yes [provider]  escitalopram (LEXAPRO) 20 MG tablet Take 1 tablet (20 mg total) by mouth daily. 03/29/19  Yes [name], MD  indomethacin (INDOCIN) 50 MG capsule Take 1 capsule (50 mg total) by mouth 3 (three) times daily with meals. 12/12/19  Yes Luetta Nutting, DO  loratadine (CLARITIN) 10 MG tablet Take 10 mg by mouth daily.   Yes [provider]  losartan (COZAAR) 50 MG tablet Take 1 tablet (50 mg total) by mouth daily. 11/15/19  Yes [name], MD  naltrexone (DEPADE) 50 MG tablet TAKE 1 TABLET (50 MG TOTAL) BY MOUTH DAILY. 11/15/19  Yes [name], MD  REXULTI 2 MG TABS tablet Take 1 tablet (2 mg total) by mouth daily. 03/29/19  Yes [name], MD  sildenafil (REVATIO) 20 MG tablet Take 2-5 tablets (40-100 mg total) by mouth daily as needed. 08/04/18  Yes [name], MD  traZODone (DESYREL) 100 MG tablet TAKE 1 TABLET (100 MG TOTAL) BY MOUTH AT BEDTIME. 10/19/19  Yes [name],  MD  aspirin EC 325 MG tablet Take 1 tablet (325 mg total) by mouth 2 (two) times daily after a meal. Take x 1 month post op to decrease risk of blood clots. Patient not taking: Reported on 12/29/2019 12/03/18   Loni Dolly, PA-C  docusate sodium (COLACE) 100 MG capsule Take 1 capsule (100 mg total) by mouth 2 (two) times daily. Patient not taking: Reported on 12/29/2019 12/03/18   Loni Dolly, PA-C    Physical Exam: BP (!) 158/107 (BP Location: Left Arm)   Pulse 92   Temp 98.1 F (36.7 C) (Oral)   Resp (!) 22   Ht 5\' 10"  (1.778 m)   Wt 120.2 kg   SpO2 96%   BMI 38.02 kg/m   . General: 59 y.o. year-old male well developed well nourished in no acute distress.  Alert and oriented x3. Marland Kitchen HEENT: NCAT, EOMI, PERRLA . Neck: Supple, trachea medial . Cardiovascular: Tachycardia.  Regular rate and rhythm with no rubs or gallops.  No thyromegaly  or JVD noted.  No lower extremity edema. 2/4 pulses in all 4 extremities. Marland Kitchen Respiratory: Tachypnea.  Clear to auscultation with no wheezes or rales. Good inspiratory effort. . Abdomen: Soft nontender nondistended with normal bowel sounds x4 quadrants. . Muskuloskeletal: No cyanosis, clubbing or edema noted bilaterally . Neuro: CN II-XII intact, strength, sensation, reflexes . Skin: No ulcerative lesions noted or rashes . Psychiatry: Judgement and insight appear normal. Mood is appropriate for condition and setting          Labs on Admission:  Basic Metabolic Panel: Recent Labs  Lab 12/29/19 1708  NA 134*  K 3.7  CL 100  CO2 21*  GLUCOSE 131*  BUN 21*  CREATININE 1.25*  CALCIUM 8.6*   Liver Function Tests: Recent Labs  Lab 12/29/19 1708  AST 21  ALT 22  ALKPHOS 92  BILITOT 0.9  PROT 8.2*  ALBUMIN 3.9   No results for input(s): LIPASE, AMYLASE in the last 168 hours. No results for input(s): AMMONIA in the last 168 hours. CBC: Recent Labs  Lab 12/29/19 1708  WBC 17.7*  NEUTROABS 12.2*  HGB 16.3  HCT 46.4  MCV 85.6  PLT 321    Cardiac Enzymes: No results for input(s): CKTOTAL, CKMB, CKMBINDEX, TROPONINI in the last 168 hours.  BNP (last 3 results) Recent Labs    12/29/19 1715  BNP 36.5    ProBNP (last 3 results) No results for input(s): PROBNP in the last 8760 hours.  CBG: No results for input(s): GLUCAP in the last 168 hours.  Radiological Exams on Admission: CT Angio Chest PE W and/or Wo Contrast  Result Date: 12/29/2019 CLINICAL DATA:  Shortness of breath, low O2 sats EXAM: CT ANGIOGRAPHY CHEST WITH CONTRAST TECHNIQUE: Multidetector CT imaging of the chest was performed using the standard protocol during bolus administration of intravenous contrast. Multiplanar CT image reconstructions and MIPs were obtained to evaluate the vascular anatomy. CONTRAST:  66mL OMNIPAQUE IOHEXOL 350 MG/ML SOLN COMPARISON:  None. FINDINGS: Cardiovascular: Several pulmonary emboli are seen within the right pulmonary arteries involving all lobes. Small pulmonary emboli within a left lower lobe pulmonary arterial subsegmental branch. No evidence of right heart strain. Heart is normal size. Aorta is normal caliber. Mediastinum/Nodes: No mediastinal, hilar, or axillary adenopathy. Trachea and esophagus are unremarkable. Thyroid unremarkable. Lungs/Pleura: Lungs are clear. No focal airspace opacities or suspicious nodules. No effusions. Upper Abdomen: Imaging into the upper abdomen shows no acute findings. Musculoskeletal: Chest wall soft tissues are unremarkable. No acute bony abnormality. Review of the MIP images confirms the above findings. IMPRESSION: Multiple right pulmonary emboli within the central pulmonary arteries and branches in all lobes of the right lung. Small subsegmental left lower lobe pulmonary embolus. No evidence of right heart strain. Critical Value/emergent results were called by telephone at the time of interpretation on 12/29/2019 at 6:35 pm to provider JULIE HAVILAND , who verbally acknowledged these results.  Electronically Signed   By: Rolm Baptise M.D.   On: 12/29/2019 18:36   DG Chest Portable 1 View  Result Date: 12/29/2019 CLINICAL DATA:  Chest pain, shortness of breath EXAM: PORTABLE CHEST 1 VIEW COMPARISON:  11/24/2018 FINDINGS: Heart and mediastinal contours are within normal limits. No focal opacities or effusions. No acute bony abnormality. IMPRESSION: Normal study. Electronically Signed   By: Rolm Baptise M.D.   On: 12/29/2019 17:57    EKG: I independently viewed the EKG done and my findings are as followed: Sinus tachycardia at a rate of 120 bpm  Assessment/Plan  Present on Admission: . Pulmonary embolus ([place]) . Pulmonary embolism ([place]) . Hyperlipidemia . Essential hypertension . 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  Small subsegmental left lower lobe pulmonary embolus with no evidence of right heart strain noted. Patient was already started on IV heparin drip with plan to possibly transition patient to [place] in the morning Echocardiogram will be done in the morning  Chest pain possibly secondary to above He endorsed chest pain only when he takes deep breath Troponin I 12, continue to trend troponin  Essential hypertension Continue losartan per home regimen when med rec is updated  Hyperlipidemia Continue atorvastatin per home regimen when med rec is updated  Depression Continue home meds when med rec is updated  Anxiety Continue buspirone per home regimen when med rec is  updated  Obesity Height: 5\' 10"  Last weight: 120.2 kg BMI 38.02 kg/m   DVT prophylaxis: IV heparin  Code Status: Full code  Family Communication: None at bedside  Disposition Plan:  Patient is from:                        home Anticipated DC to:                   Home Anticipated DC date:               2-3 days Anticipated DC barriers:          Clinical improvement with improved oxygenation prior patient does not use oxygen at baseline)   Consults called: None  Admission status: Inpatient due to severity of patient's condition currently requiring IV heparin drip prior to transitioning to DOAC and currently oxygen dependent (patient does not use oxygen at baseline)  Bernadette Hoit MD [facility]  If 7PM-7AM, please contact night-coverage [email or web address]  12/29/2019, 9:50 PM

## 2019-12-29 NOTE — ED Notes (Signed)
MD aware of pt , pt moved to rm 11

## 2019-12-29 NOTE — Progress Notes (Signed)
ANTICOAGULATION CONSULT NOTE  Pharmacy Consult for Heparin Indication: pulmonary embolus  No Known Allergies  Patient Measurements: Height: 5\' 10"  (177.8 cm) Weight: 120.2 kg (265 lb) IBW/kg (Calculated) : 73 Heparin Dosing Weight: 99.9 kg  Vital Signs: Temp: 99.1 F (37.3 C) (07/02 1655) Temp Source: Oral (07/02 1655) BP: 139/98 (07/02 1800) Pulse Rate: 107 (07/02 1800)  Labs: Recent Labs    12/29/19 1708  HGB 16.3  HCT 46.4  PLT 321  CREATININE 1.25*  TROPONINIHS 12    Estimated Creatinine Clearance: 81.7 mL/min (A) (by C-G formula based on SCr of 1.25 mg/dL (H)).   Medical History: Past Medical History:  Diagnosis Date  . 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 Anxiety   . Depression   . GERD (gastroesophageal reflux disease)   . Hemorrhoids   . Hip arthritis   . Hyperlipidemia   . Hypertension   . Joint pain   . Osteoarthritis   . Seizures ([place])    ?? in August ...more related to ETOH rehab    Medications:  Scheduled:  . heparin  7,000 Units Intravenous Once    Assessment: Patient is a 10 yom that presents to the ED with SOB and chest pain. The patient was found on further exam to have multiple PEs Pharmacy has been asked to dose heparin at this time.    Goal of Therapy:  Heparin level 0.3-0.7 units/ml Monitor platelets by anticoagulation protocol: Yes   Plan:  - Heparin bolus 7000 units/hr IV x 1 dose - Heparin drip @ 1650 units/hr - Heparin level in ~ 6 hours  - Monitor patient for s/s of bleeding and CBC while on heparin   Duanne Limerick PharmD. BCPS  12/29/2019,6:51 PM

## 2019-12-29 NOTE — ED Provider Notes (Signed)
[facility] EMERGENCY DEPARTMENT Provider Note   CSN: [id number] Arrival date & time: 12/29/19  1647     History Chief Complaint  Patient presents with  . Shortness of Breath    Jackson Sherman is a 59 y.o. male.  Pt presents to the ED today with SOB.  Pt has had sob for 2 days.  He said it's gotten worse today.  He has some CP as well.  His initial RA O2 sat was 86%.  He was put on 2L.  He has no hx of lung problems.  He has not had f/c.  No known Covid exposures.  He's fully vaccinated.        Past Medical History:  Diagnosis Date  . 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 Anxiety   . Depression   . GERD (gastroesophageal reflux disease)   . Hemorrhoids   . Hip arthritis   . Hyperlipidemia   . Hypertension   . Joint pain   . Osteoarthritis   . Seizures ([place])    ?? in August ...more related to ETOH rehab    Patient Active Problem List   Diagnosis Date Noted  . Pulmonary embolus ([place]) 12/29/2019  . Pulmonary embolism ([place]) 12/29/2019  . Anxiety 12/29/2019  . Acute on chronic respiratory failure with hypoxia ([place]) 12/29/2019  . Chest pain 12/29/2019  . Right knee pain 12/12/2019  . Current severe episode of major depressive disorder without psychotic features without prior episode ([place]) 01/02/2019  . Primary osteoarthritis of right hip 12/02/2018  . Vitamin D deficiency 09/29/2018  . Insulin resistance 09/29/2018  . History of seizure 08/31/2018  . Right hip pain 08/04/2018  . Chronic left shoulder pain 12/22/2016  . 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 Chronic pain of left knee 04/09/2016  . Erectile dysfunction 03/16/2016  . PTSD (post-traumatic stress disorder) 03/16/2016  . Seizure ([place])   . Elevated uric acid in blood 09/25/2015  . Unipolar depression ([place]) 08/22/2012  . Insomnia 05/06/2010  . Hyperlipidemia 04/06/2006  . OBESITY, NOS 04/06/2006  . Alcohol dependence ([place]) 04/06/2006  . Essential hypertension 04/06/2006    Past  Surgical History:  Procedure Laterality Date  . ARTHROSCOPIC REPAIR ACL     left  . FRACTURE SURGERY     no surgery on broke thumb  . HEMORRHOID SURGERY N/A 11/23/2012   Procedure: HEMORRHOIDECTOMY;  Surgeon: Madilyn Hook, DO;  Location: [facility];  Service: General;  Laterality: N/A;  . JOINT REPLACEMENT Right 12/02/2018   right total hip arthroplasty  . TONSILLECTOMY     Age 8   . TOTAL HIP ARTHROPLASTY Right 12/02/2018   Procedure: TOTAL HIP ARTHROPLASTY ANTERIOR APPROACH;  Surgeon: Dorna Leitz, MD;  Location: [facility];  Service: Orthopedics;  Laterality: Right;  . TOTAL KNEE ARTHROPLASTY Left 06/15/2016   Procedure: LEFT TOTAL KNEE ARTHROPLASTY;  Surgeon: Vickey Huger, MD;  Location: [facility];  Service: Orthopedics;  Laterality: Left;       Family History  Problem Relation Age of Onset  . Heart disease Father   . Heart disease Maternal Grandmother 41  . Stroke Maternal Grandmother   . Diabetes Maternal Grandmother   . Hyperlipidemia Mother   . Hypertension Mother   . Breast cancer Mother 27  . Depression Sister   . Diabetes Sister   . Diabetes Brother   . Diabetes Paternal Grandmother   . Stroke Paternal Grandmother     Social History   Tobacco Use  . Smoking status: Former Smoker  Packs/day: 1.00    Years: 15.00    Pack years: 15.00    Types: Cigarettes    Quit date: 06/30/2003    Years since quitting: 16.5  . Smokeless tobacco: Never Used  Vaping Use  . Vaping Use: Never used  Substance Use Topics  . Alcohol use: Not Currently  . Drug use: No    Home Medications Prior to Admission medications   Medication Sig Start Date End Date Taking? Authorizing Provider  atorvastatin (LIPITOR) 20 MG tablet Take 20 mg by mouth at bedtime 12/22/18  Yes [name], MD  busPIRone (BUSPAR) 15 MG tablet TAKE 1 TABLET (15 MG TOTAL) BY MOUTH 2 (TWO) TIMES DAILY. 10/19/19  Yes [name], MD  diphenhydrAMINE (BENADRYL) 25 MG tablet Take 25 mg by mouth at bedtime as  needed for sleep.    Yes [provider]  escitalopram (LEXAPRO) 20 MG tablet Take 1 tablet (20 mg total) by mouth daily. 03/29/19  Yes [name], MD  indomethacin (INDOCIN) 50 MG capsule Take 1 capsule (50 mg total) by mouth 3 (three) times daily with meals. 12/12/19  Yes Luetta Nutting, DO  loratadine (CLARITIN) 10 MG tablet Take 10 mg by mouth daily.   Yes [provider]  losartan (COZAAR) 50 MG tablet Take 1 tablet (50 mg total) by mouth daily. 11/15/19  Yes [name], MD  naltrexone (DEPADE) 50 MG tablet TAKE 1 TABLET (50 MG TOTAL) BY MOUTH DAILY. 11/15/19  Yes [name], MD  REXULTI 2 MG TABS tablet Take 1 tablet (2 mg total) by mouth daily. 03/29/19  Yes [name], MD  sildenafil (REVATIO) 20 MG tablet Take 2-5 tablets (40-100 mg total) by mouth daily as needed. 08/04/18  Yes [name], MD  traZODone (DESYREL) 100 MG tablet TAKE 1 TABLET (100 MG TOTAL) BY MOUTH AT BEDTIME. 10/19/19  Yes [name], MD    Allergies    Patient has no known allergies.  Review of Systems   Review of Systems  Respiratory: Positive for shortness of breath.   All other systems reviewed and are negative.   Physical Exam Updated Vital Signs BP 133/67   Pulse 98   Temp 97.9 F (36.6 C) (Oral)   Resp (!) 21   Ht 5\' 10"  (1.778 m)   Wt 120.2 kg   SpO2 92%   BMI 38.02 kg/m   Physical Exam Vitals and nursing note reviewed.  Constitutional:      General: He is in acute distress.     Appearance: He is well-developed. He is ill-appearing.  HENT:     Head: Normocephalic and atraumatic.     Mouth/Throat:     Mouth: Mucous membranes are moist.     Pharynx: Oropharynx is clear.  Eyes:     Extraocular Movements: Extraocular movements intact.     Pupils: Pupils are equal, round, and reactive to light.  Cardiovascular:     Rate and Rhythm: Regular rhythm. Tachycardia present.  Pulmonary:     Effort: Tachypnea present.      Breath sounds: Normal breath sounds.  Abdominal:     General: Bowel sounds are normal.     Palpations: Abdomen is soft.  Musculoskeletal:        General: Normal range of motion.     Cervical back: Normal range of motion and neck supple.  Skin:    General: Skin is warm.     Capillary Refill: Capillary refill takes less  than 2 seconds.  Neurological:     General: No focal deficit present.     Mental Status: He is alert and oriented to person, place, and time.  Psychiatric:        Mood and Affect: Mood normal.        Behavior: Behavior normal.     ED Results / Procedures / Treatments   Labs (all labs ordered are listed, but only abnormal results are displayed) Labs Reviewed  CBC WITH DIFFERENTIAL/PLATELET - Abnormal; Notable for the following components:      Result Value   WBC 17.7 (*)    Neutro Abs 12.2 (*)    Monocytes Absolute 1.4 (*)    Abs Immature Granulocytes 0.08 (*)    All other components within normal limits  COMPREHENSIVE METABOLIC PANEL - Abnormal; Notable for the following components:   Sodium 134 (*)    CO2 21 (*)    Glucose, Bld 131 (*)    BUN 21 (*)    Creatinine, Ser 1.25 (*)    Calcium 8.6 (*)    Total Protein 8.2 (*)    All other components within normal limits  COMPREHENSIVE METABOLIC PANEL - Abnormal; Notable for the following components:   Sodium 134 (*)    Glucose, Bld 134 (*)    Calcium 8.6 (*)    Albumin 3.4 (*)    All other components within normal limits  CBC - Abnormal; Notable for the following components:   WBC 15.9 (*)    All other components within normal limits  APTT - Abnormal; Notable for the following components:   aPTT 86 (*)    All other components within normal limits  HEPARIN LEVEL (UNFRACTIONATED) - Abnormal; Notable for the following components:   Heparin Unfractionated 0.87 (*)    All other components within normal limits  CBC WITH DIFFERENTIAL/PLATELET - Abnormal; Notable for the following components:   WBC 15.6 (*)      Neutro Abs 11.3 (*)    Monocytes Absolute 1.5 (*)    All other components within normal limits  CBC - Abnormal; Notable for the following components:   WBC 14.6 (*)    All other components within normal limits  SARS CORONAVIRUS 2 BY RT PCR (HOSPITAL ORDER, [facility])  BRAIN NATRIURETIC PEPTIDE  ANTITHROMBIN III  HIV ANTIBODY (ROUTINE TESTING W REFLEX)  PROTIME-INR  MAGNESIUM  PHOSPHORUS  PROTEIN C ACTIVITY  PROTEIN C, TOTAL  PROTEIN S ACTIVITY  PROTEIN S, TOTAL  LUPUS ANTICOAGULANT PANEL  BETA-2-GLYCOPROTEIN I ABS, IGG/M/A  HOMOCYSTEINE  FACTOR 5 LEIDEN  PROTHROMBIN GENE MUTATION  CARDIOLIPIN ANTIBODIES, IGG, IGM, IGA  CBC  TSH  TROPONIN I (HIGH SENSITIVITY)    EKG EKG Interpretation  Date/Time:  Friday December 29 2019 16:48:38 EDT Ventricular Rate:  128 PR Interval:  148 QRS Duration: 86 QT Interval:  304 QTC Calculation: 443 R Axis:   -17 Text Interpretation: Sinus tachycardia Possible Left atrial enlargement Nonspecific ST abnormality Abnormal ECG Since last tracing rate faster Confirmed by Isla Pence [phone]) on 12/29/2019 4:57:24 PM   Radiology ECHOCARDIOGRAM COMPLETE  Result Date: 12/30/2019    ECHOCARDIOGRAM REPORT   Patient Name:   Jamile L House Date of Exam: 12/30/2019 Medical Rec #:  [id number]         Height:       70.0 in Accession #:    [id number]        Weight:       265.0 lb Date  of Birth:  Oct 27, 1960         BSA:          2.353 m Patient Age:    59 years          BP:           136/94 mmHg Patient Gender: M                 HR:           85 bpm. Exam Location:  Inpatient Procedure: 2D Echo, Cardiac Doppler, Color Doppler and Intracardiac            Opacification Agent Indications:    Chest pain  History:        Patient has no prior history of Echocardiogram examinations.                 Signs/Symptoms:Shortness of Breath, Chest Pain and pulmonary                 emboli; Risk Factors:Hypertension, Dyslipidemia and Obesity.   Sonographer:    Dustin Flock Referring Phys: [id number] OLADAPO ADEFESO IMPRESSIONS  1. Left ventricular ejection fraction, by estimation, is 60 to 65%. The left ventricle has normal function. The left ventricle has no regional wall motion abnormalities. There is moderate left ventricular hypertrophy. Left ventricular diastolic parameters are indeterminate.  2. Right ventricular systolic function is mildly reduced. The right ventricular size is normal. Tricuspid regurgitation signal is inadequate for assessing PA pressure.  3. The mitral valve is grossly normal. Mild mitral valve regurgitation.  4. The aortic valve is tricuspid. Aortic valve regurgitation is not visualized. FINDINGS  Left Ventricle: Left ventricular ejection fraction, by estimation, is 60 to 65%. The left ventricle has normal function. The left ventricle has no regional wall motion abnormalities. The left ventricular internal cavity size was normal in size. There is  moderate left ventricular hypertrophy. Left ventricular diastolic parameters are indeterminate. Right Ventricle: The right ventricular size is normal. No increase in right ventricular wall thickness. Right ventricular systolic function is mildly reduced. Tricuspid regurgitation signal is inadequate for assessing PA pressure. Left Atrium: Left atrial size was normal in size. Right Atrium: Right atrial size was normal in size. Pericardium: There is no evidence of pericardial effusion. Mitral Valve: The mitral valve is grossly normal. Mild mitral valve regurgitation. Tricuspid Valve: The tricuspid valve is grossly normal. Tricuspid valve regurgitation is trivial. Aortic Valve: The aortic valve is tricuspid. Aortic valve regurgitation is not visualized. Mild aortic valve annular calcification. Pulmonic Valve: The pulmonic valve was grossly normal. Pulmonic valve regurgitation is trivial. Aorta: The aortic root is normal in size and structure. IAS/Shunts: No atrial level shunt detected by  color flow Doppler.  LEFT VENTRICLE PLAX 2D LVIDd:         3.70 cm  Diastology LVIDs:         2.50 cm  LV e' lateral:   9.90 cm/s LV PW:         1.70 cm  LV E/e' lateral: 5.4 LV IVS:        1.90 cm  LV e' medial:    9.57 cm/s LVOT diam:     2.10 cm  LV E/e' medial:  5.5 LV SV:         43 LV SV Index:   18 LVOT Area:     3.46 cm  RIGHT VENTRICLE RV Basal diam:  4.60 cm RV S prime:     25.00 cm/s TAPSE (M-mode):  3.6 cm LEFT ATRIUM           Index       RIGHT ATRIUM           Index LA diam:      3.70 cm 1.57 cm/m  RA Area:     15.20 cm LA Vol (A4C): 52.2 ml 22.19 ml/m RA Volume:   39.00 ml  16.58 ml/m  AORTIC VALVE LVOT Vmax:   79.00 cm/s LVOT Vmean:  45.500 cm/s LVOT VTI:    0.125 m  AORTA Ao Root diam: 3.60 cm MITRAL VALVE MV Area (PHT): 6.12 cm    SHUNTS MV Decel Time: 124 msec    Systemic VTI:  0.12 m MV E velocity: 53.10 cm/s  Systemic Diam: 2.10 cm MV A velocity: 74.10 cm/s MV E/A ratio:  0.72 Rozann Lesches MD Electronically signed by Rozann Lesches MD Signature Date/Time: 12/30/2019/12:27:00 PM    Final    VAS [place] LOWER EXTREMITY VENOUS (DVT)  Result Date: 12/30/2019  Lower Venous DVTStudy Indications: Swelling, and Edema.  Comparison Study: no prior Performing Technologist: Abram Sander RVS  Examination Guidelines: A complete evaluation includes B-mode imaging, spectral Doppler, color Doppler, and power Doppler as needed of all accessible portions of each vessel. Bilateral testing is considered an integral part of a complete examination. Limited examinations for reoccurring indications may be performed as noted. The reflux portion of the exam is performed with the patient in reverse Trendelenburg.  +---------+---------------+---------+-----------+----------+--------------+ RIGHT    CompressibilityPhasicitySpontaneityPropertiesThrombus Aging +---------+---------------+---------+-----------+----------+--------------+ CFV      Full           Yes      Yes                                  +---------+---------------+---------+-----------+----------+--------------+ SFJ      Full                                                        +---------+---------------+---------+-----------+----------+--------------+ FV Prox  Full                                                        +---------+---------------+---------+-----------+----------+--------------+ FV Mid   Full                                                        +---------+---------------+---------+-----------+----------+--------------+ FV DistalFull                                                        +---------+---------------+---------+-----------+----------+--------------+ PFV      Full                                                        +---------+---------------+---------+-----------+----------+--------------+  POP      Full           Yes      Yes                                 +---------+---------------+---------+-----------+----------+--------------+ PTV      Full                                                        +---------+---------------+---------+-----------+----------+--------------+ PERO     Full                                                        +---------+---------------+---------+-----------+----------+--------------+   +---------+---------------+---------+-----------+----------+--------------+ LEFT     CompressibilityPhasicitySpontaneityPropertiesThrombus Aging +---------+---------------+---------+-----------+----------+--------------+ CFV      Full           Yes      Yes                                 +---------+---------------+---------+-----------+----------+--------------+ SFJ      Full                                                        +---------+---------------+---------+-----------+----------+--------------+ FV Prox  Full                                                         +---------+---------------+---------+-----------+----------+--------------+ FV Mid   Full                                                        +---------+---------------+---------+-----------+----------+--------------+ FV DistalFull                                                        +---------+---------------+---------+-----------+----------+--------------+ PFV      Full                                                        +---------+---------------+---------+-----------+----------+--------------+ POP      Full           Yes      Yes                                 +---------+---------------+---------+-----------+----------+--------------+  PTV      Full                                                        +---------+---------------+---------+-----------+----------+--------------+ PERO     Full                                                        +---------+---------------+---------+-----------+----------+--------------+     Summary: BILATERAL: - No evidence of deep vein thrombosis seen in the lower extremities, bilaterally. -No evidence of popliteal cyst, bilaterally.   *See table(s) above for measurements and observations. Electronically signed by Monica Martinez MD on 12/30/2019 at 3:16:31 PM.    Final     Procedures Procedures (including critical care time)  Medications Ordered in ED Medications  heparin ADULT infusion 100 units/mL (25000 units/243mL sodium chloride 0.45%) (1,500 Units/hr Intravenous New Bag/Given 12/30/19 0545)  atorvastatin (LIPITOR) tablet 20 mg (20 mg Oral Given 01/01/20 0816)  losartan (COZAAR) tablet 50 mg (50 mg Oral Given 01/01/20 0816)  busPIRone (BUSPAR) tablet 15 mg (15 mg Oral Given 01/01/20 0815)  apixaban (ELIQUIS) tablet 10 mg (10 mg Oral Given 01/01/20 0815)    Followed by  apixaban (ELIQUIS) tablet 5 mg (has no administration in time range)  traZODone (DESYREL) tablet 100 mg (100 mg Oral Given 12/31/19 2100)  escitalopram  (LEXAPRO) tablet 20 mg (20 mg Oral Given 01/01/20 0815)  perflutren lipid microspheres (DEFINITY) IV suspension (2 mLs Intravenous Given 12/30/19 1405)  brexpiprazole (REXULTI) tablet 2 mg (2 mg Oral Given 01/01/20 0816)  acetaminophen (TYLENOL) tablet 650 mg (650 mg Oral Given 12/30/19 2049)  metoprolol tartrate (LOPRESSOR) tablet 12.5 mg (12.5 mg Oral Given 01/01/20 0950)  iohexol (OMNIPAQUE) 350 MG/ML injection 100 mL (78 mLs Intravenous Contrast Given 12/29/19 1749)  heparin bolus via infusion 7,000 Units (7,000 Units Intravenous Bolus from Bag 12/29/19 1913)    ED Course  I have reviewed the triage vital signs and the nursing notes.  Pertinent labs & imaging results that were available during my care of the patient were reviewed by me and considered in my medical decision making (see chart for details).    MDM Rules/Calculators/A&P                         CT does show evidence of bilateral PE.  No heart strain.  HR is down to the low 100s with oxygen.  Hypercoagulable labs ordered.  Heparin IV ordered.  Pt's oxygen has been in the mid-90s on 2L.  He is feeling better with the oxygen.  Pt d/w Dr. Daryll Drown (triad) for admission.  Covid negative.  CRITICAL CARE Performed by: Isla Pence   Total critical care time: 30 minutes  Critical care time was exclusive of separately billable procedures and treating other patients.  Critical care was necessary to treat or prevent imminent or life-threatening deterioration.  Critical care was time spent personally by me on the following activities: development of treatment plan with patient and/or surrogate as well as nursing, discussions with consultants, evaluation of patient's response to treatment, examination of patient, obtaining history from patient or surrogate, ordering and performing  treatments and interventions, ordering and review of laboratory studies, ordering and review of radiographic studies, pulse oximetry and re-evaluation of patient's  condition.   Final Clinical Impression(s) / ED Diagnoses Final diagnoses:  Bilateral pulmonary embolism ([place])  Acute respiratory failure with hypoxia [facility])    Rx / DC Orders ED Discharge Orders    None       Isla Pence, MD 01/01/20 1014

## 2019-12-29 NOTE — ED Notes (Signed)
Attempted IV x 1; unable to obtain

## 2019-12-30 ENCOUNTER — Inpatient Hospital Stay (HOSPITAL_COMMUNITY): Payer: No Typology Code available for payment source

## 2019-12-30 DIAGNOSIS — I340 Nonrheumatic mitral (valve) insufficiency: Secondary | ICD-10-CM

## 2019-12-30 DIAGNOSIS — M7989 Other specified soft tissue disorders: Secondary | ICD-10-CM

## 2019-12-30 LAB — HIV ANTIBODY (ROUTINE TESTING W REFLEX): HIV Screen 4th Generation wRfx: NONREACTIVE

## 2019-12-30 LAB — COMPREHENSIVE METABOLIC PANEL
ALT: 23 U/L (ref 0–44)
AST: 19 U/L (ref 15–41)
Albumin: 3.4 g/dL — ABNORMAL LOW (ref 3.5–5.0)
Alkaline Phosphatase: 78 U/L (ref 38–126)
Anion gap: 12 (ref 5–15)
BUN: 17 mg/dL (ref 6–20)
CO2: 22 mmol/L (ref 22–32)
Calcium: 8.6 mg/dL — ABNORMAL LOW (ref 8.9–10.3)
Chloride: 100 mmol/L (ref 98–111)
Creatinine, Ser: 1.13 mg/dL (ref 0.61–1.24)
GFR calc Af Amer: >60 mL/min (ref >60)
GFR calc non Af Amer: >60 mL/min (ref >60)
Glucose, Bld: 134 mg/dL — ABNORMAL HIGH (ref 70–99)
Potassium: 3.8 mmol/L (ref 3.5–5.1)
Sodium: 134 mmol/L — ABNORMAL LOW (ref 135–145)
Total Bilirubin: 1 mg/dL (ref 0.3–1.2)
Total Protein: 7.3 g/dL (ref 6.5–8.1)

## 2019-12-30 LAB — CBC
HCT: 46.3 % (ref 39.0–52.0)
Hemoglobin: 15.7 g/dL (ref 13.0–17.0)
MCH: 29.6 pg (ref 26.0–34.0)
MCHC: 33.9 g/dL (ref 30.0–36.0)
MCV: 87.4 fL (ref 80.0–100.0)
Platelets: 293 10*3/uL (ref 150–400)
RBC: 5.3 MIL/uL (ref 4.22–5.81)
RDW: 12.5 % (ref 11.5–15.5)
WBC: 15.9 10*3/uL — ABNORMAL HIGH (ref 4.0–10.5)
nRBC: 0 % (ref 0.0–0.2)

## 2019-12-30 LAB — PROTIME-INR
INR: 1.1 (ref 0.8–1.2)
Prothrombin Time: 13.8 seconds (ref 11.4–15.2)

## 2019-12-30 LAB — PHOSPHORUS: Phosphorus: 3.9 mg/dL (ref 2.5–4.6)

## 2019-12-30 LAB — ECHOCARDIOGRAM COMPLETE
Height: 70 in
Weight: 4240 oz

## 2019-12-30 LAB — MAGNESIUM: Magnesium: 2 mg/dL (ref 1.7–2.4)

## 2019-12-30 LAB — APTT: aPTT: 86 seconds — ABNORMAL HIGH (ref 24–36)

## 2019-12-30 LAB — HEPARIN LEVEL (UNFRACTIONATED): Heparin Unfractionated: 0.87 IU/mL — ABNORMAL HIGH (ref 0.30–0.70)

## 2019-12-30 MED ORDER — APIXABAN 5 MG PO TABS
10.0000 mg | ORAL_TABLET | Freq: Two times a day (BID) | ORAL | Status: DC
  Administered 2019-12-30 – 2020-01-01 (×5): 10 mg via ORAL
  Filled 2019-12-30 (×5): qty 2

## 2019-12-30 MED ORDER — BUSPIRONE HCL 10 MG PO TABS
15.0000 mg | ORAL_TABLET | Freq: Two times a day (BID) | ORAL | Status: DC
  Administered 2019-12-30 – 2020-01-01 (×5): 15 mg via ORAL
  Filled 2019-12-30 (×5): qty 2

## 2019-12-30 MED ORDER — LOSARTAN POTASSIUM 50 MG PO TABS
50.0000 mg | ORAL_TABLET | Freq: Every day | ORAL | Status: DC
  Administered 2019-12-30 – 2020-01-01 (×3): 50 mg via ORAL
  Filled 2019-12-30 (×3): qty 1

## 2019-12-30 MED ORDER — ACETAMINOPHEN 325 MG PO TABS
650.0000 mg | ORAL_TABLET | Freq: Four times a day (QID) | ORAL | Status: DC | PRN
  Administered 2019-12-30: 650 mg via ORAL
  Filled 2019-12-30: qty 2

## 2019-12-30 MED ORDER — ESCITALOPRAM OXALATE 20 MG PO TABS
20.0000 mg | ORAL_TABLET | Freq: Every day | ORAL | Status: DC
  Administered 2019-12-30 – 2020-01-01 (×3): 20 mg via ORAL
  Filled 2019-12-30 (×3): qty 1

## 2019-12-30 MED ORDER — ATORVASTATIN CALCIUM 10 MG PO TABS
20.0000 mg | ORAL_TABLET | Freq: Every day | ORAL | Status: DC
  Administered 2019-12-30 – 2020-01-01 (×3): 20 mg via ORAL
  Filled 2019-12-30 (×3): qty 2

## 2019-12-30 MED ORDER — TRAZODONE HCL 100 MG PO TABS
100.0000 mg | ORAL_TABLET | Freq: Every day | ORAL | Status: DC
  Administered 2019-12-30 – 2019-12-31 (×2): 100 mg via ORAL
  Filled 2019-12-30 (×2): qty 1

## 2019-12-30 MED ORDER — PERFLUTREN LIPID MICROSPHERE
1.0000 mL | INTRAVENOUS | Status: AC | PRN
  Administered 2019-12-30: 2 mL via INTRAVENOUS
  Filled 2019-12-30: qty 10

## 2019-12-30 MED ORDER — BREXPIPRAZOLE 2 MG PO TABS: 2.0000 mg | ORAL_TABLET | Freq: Every day | ORAL | Status: DC

## 2019-12-30 MED ORDER — BREXPIPRAZOLE 2 MG PO TABS
2.0000 mg | ORAL_TABLET | Freq: Every day | ORAL | Status: DC
  Administered 2019-12-31 – 2020-01-01 (×2): 2 mg via ORAL
  Filled 2019-12-30 (×2): qty 1

## 2019-12-30 MED ORDER — APIXABAN 5 MG PO TABS: 5.0000 mg | ORAL_TABLET | Freq: Two times a day (BID) | ORAL | Status: DC

## 2019-12-30 NOTE — Progress Notes (Signed)
  Echocardiogram 2D Echocardiogram has been performed.  Matilde Bash 12/30/2019, 11:58 AM

## 2019-12-30 NOTE — Progress Notes (Signed)
Lower extremity venous has been completed.   Preliminary results in CV Proc.   Jackson Sherman 12/30/2019 11:31 AM

## 2019-12-30 NOTE — Progress Notes (Signed)
ANTICOAGULATION CONSULT NOTE Pharmacy Consult for Heparin Indication: pulmonary embolus  No Known Allergies  Patient Measurements: Height: 5\' 10"  (177.8 cm) Weight: 120.2 kg (265 lb) IBW/kg (Calculated) : 73 Heparin Dosing Weight: 99.9 kg  Vital Signs: Temp: 98 F (36.7 C) (07/03 0335) Temp Source: Oral (07/03 0335) BP: 134/93 (07/02 2306) Pulse Rate: 92 (07/02 2046)  Labs: Recent Labs    12/29/19 1708 12/30/19 0234  HGB 16.3 15.7  HCT 46.4 46.3  PLT 321 293  HEPARINUNFRC  --  0.87*  CREATININE 1.25* 1.13  TROPONINIHS 12  --     Estimated Creatinine Clearance: 90.4 mL/min (by C-G formula based on SCr of 1.13 mg/dL).  Assessment: 59 y.o. male with PE for heparin   Goal of Therapy:  Heparin level 0.3-0.7 units/ml Monitor platelets by anticoagulation protocol: Yes   Plan:  Decrease Heparin 1500 units/hr F/U switch to DOAC  Jackson Sherman, Bronson Curb PharmD. BCPS  12/30/2019,3:55 AM

## 2019-12-30 NOTE — Progress Notes (Signed)
[place] for Heparin>>apixaban Indication: pulmonary embolus  No Known Allergies  Patient Measurements: Height: 5\' 10"  (177.8 cm) Weight: 120.2 kg (265 lb) IBW/kg (Calculated) : 73 Heparin Dosing Weight: 99.9 kg  Vital Signs: Temp: 97.3 F (36.3 C) (07/03 0754) Temp Source: Oral (07/03 0754) BP: 136/94 (07/03 0754) Pulse Rate: 85 (07/03 0754)  Labs: Recent Labs    12/29/19 1708 12/30/19 0234  HGB 16.3 15.7  HCT 46.4 46.3  PLT 321 293  APTT  --  86*  LABPROT  --  13.8  INR  --  1.1  HEPARINUNFRC  --  0.87*  CREATININE 1.25* 1.13  TROPONINIHS 12  --     Estimated Creatinine Clearance: 90.4 mL/min (by C-G formula based on SCr of 1.13 mg/dL).  Assessment: 59 y.o. male with PE for heparin. Ok to transition to apixaban this AM per Dr. Tawanna Solo.    Goal of Therapy:  Heparin level 0.3-0.7 units/ml Monitor platelets by anticoagulation protocol: Yes   Plan:  Dc heparin and levels Apixaban 10mg  PO BID x7d then 5mg  PO BID Monitor for bleeding  Onnie Boer, PharmD, BCIDP, AAHIVP, CPP Infectious Disease Pharmacist 12/30/2019 8:41 AM

## 2019-12-30 NOTE — Progress Notes (Addendum)
PROGRESS NOTE    Jackson Sherman  AQT:[id number] DOB: 01-11-1961 DOA: 12/29/2019 PCP: [name], MD   Brief Narrative: Patient is a 59 year old male with history of hypertension, hyperlipidemia, depression, generalized anxiety disorder who presents to [place] emergency department with complaint of chest pain, shortness of breath for last 3 to 4 days. He was hypoxic, tachycardic, tachypneic on presentation. Work-up revealed leukocytosis, mild AKI. CT angiogram showed PE bilaterally. Started on heparin drip. Plan is to change to Eliquis today. Still on supplemental oxygen this morning.  Assessment & Plan:   Principal Problem:   Pulmonary embolus (HCC) Active Problems:   Hyperlipidemia   OBESITY, NOS   Essential hypertension   Unipolar depression (HCC)   Pulmonary embolism (HCC)   Anxiety   Acute on chronic respiratory failure with hypoxia (HCC)   Chest pain   Acute respiratory failure with hypoxia: Secondary to pulmonary embolism. Currently on 2 L of oxygen per minute. He is not on oxygen at home. Lungs were clear on auscultation today. We will try to wean the oxygen and monitor on room air.  Acute PE:CT angiogram PE of the chest with contrast showed multiple right pulmonary emboli within the central pulmonary arteries and branches in all lobes of the right lung.  Small subsegmental left lower lobe pulmonary embolus with no evidence of right heart strain. Started on heparin drip. Will change Eliquis today. Currently he is hemodynamically stable. I will also request a physical therapy evaluation. Will check echocardiogram and bilateral lower extremity venous Doppler to rule out DVT. Most likely ,he be on lifelong anticoagulation. No history of clots in the past or in family  Chest pain: Due to pulmonary embolism. Resolved  Hypertension: Continue home losartan. Monitor blood pressure  Hyperlipidemia: Takes Lipitor at home.  Depression/anxiety: Restart home  medications.  Morbid obesity: BMI of 38  Leukocytosis: Most likely reactive. Continue to monitor CBC. No indication for antibiotic therapy. No signs of infection.         DVT prophylaxis:IV heparin/Eliquis Code Status: Full Family Communication: None at the bedside Status is: Inpatient  Remains inpatient appropriate because:IV treatments appropriate due to intensity of illness or inability to take PO   Dispo: The patient is from: Home              Anticipated d/c is to: Home              Anticipated d/c date is: 1 day              Patient currently is not medically stable to d/c.   Consultants: None  Procedures:None  Antimicrobials:  Anti-infectives (From admission, onward)   None      Subjective:  Patient seen and examined at the bedside this morning. Hemodynamically stable. Denies any chest pain or cough today. Still on 2 L of oxygen per minute. He feels better today and wants to go to the bathroom. Objective: Vitals:   12/29/19 2046 12/29/19 2306 12/30/19 0335 12/30/19 0754  BP: (!) 158/107 (!) 134/93  (!) 136/94  Pulse: 92   85  Resp: (!) 22 19  (!) 23  Temp: 98.1 F (36.7 C) 97.8 F (36.6 C) 98 F (36.7 C) (!) 97.3 F (36.3 C)  TempSrc: Oral Oral Oral Oral  SpO2: 96% 94%  95%  Weight:      Height:        Intake/Output Summary (Last 24 hours) at 12/30/2019 0844 Last data filed at 12/30/2019 5625 Gross  per 24 hour  Intake 371.04 ml  Output 550 ml  Net -178.96 ml   Filed Weights   12/29/19 1652  Weight: 120.2 kg    Examination:  General exam: Appears calm and comfortable ,Not in distress,morbidly obese HEENT:PERRL,Oral mucosa moist, Ear/Nose normal on gross exam Respiratory system: Bilateral equal air entry, normal vesicular breath sounds, no wheezes or crackles  Cardiovascular system: S1 & S2 heard, RRR. No JVD, murmurs, rubs, gallops or clicks. No pedal edema. Gastrointestinal system: Abdomen is nondistended, soft and nontender. No organomegaly  or masses felt. Normal bowel sounds heard. Central nervous system: Alert and oriented. No focal neurological deficits. Extremities: No edema, no clubbing ,no cyanosis, distal peripheral pulses palpable. Skin: No rashes, lesions or ulcers,no icterus ,no pallor   Data Reviewed: I have personally reviewed following labs and imaging studies  CBC: Recent Labs  Lab 12/29/19 1708 12/30/19 0234  WBC 17.7* 15.9*  NEUTROABS 12.2*  --   HGB 16.3 15.7  HCT 46.4 46.3  MCV 85.6 87.4  PLT 321 865   Basic Metabolic Panel: Recent Labs  Lab 12/29/19 1708 12/30/19 0234  NA 134* 134*  K 3.7 3.8  CL 100 100  CO2 21* 22  GLUCOSE 131* 134*  BUN 21* 17  CREATININE 1.25* 1.13  CALCIUM 8.6* 8.6*  MG  --  2.0  PHOS  --  3.9   GFR: Estimated Creatinine Clearance: 90.4 mL/min (by C-G formula based on SCr of 1.13 mg/dL). Liver Function Tests: Recent Labs  Lab 12/29/19 1708 12/30/19 0234  AST 21 19  ALT 22 23  ALKPHOS 92 78  BILITOT 0.9 1.0  PROT 8.2* 7.3  ALBUMIN 3.9 3.4*   No results for input(s): LIPASE, AMYLASE in the last 168 hours. No results for input(s): AMMONIA in the last 168 hours. Coagulation Profile: Recent Labs  Lab 12/30/19 0234  INR 1.1   Cardiac Enzymes: No results for input(s): CKTOTAL, CKMB, CKMBINDEX, TROPONINI in the last 168 hours. BNP (last 3 results) No results for input(s): PROBNP in the last 8760 hours. HbA1C: No results for input(s): HGBA1C in the last 72 hours. CBG: No results for input(s): GLUCAP in the last 168 hours. Lipid Profile: No results for input(s): CHOL, HDL, LDLCALC, TRIG, CHOLHDL, LDLDIRECT in the last 72 hours. Thyroid Function Tests: No results for input(s): TSH, T4TOTAL, FREET4, T3FREE, THYROIDAB in the last 72 hours. Anemia Panel: No results for input(s): VITAMINB12, FOLATE, FERRITIN, TIBC, IRON, RETICCTPCT in the last 72 hours. Sepsis Labs: No results for input(s): PROCALCITON, LATICACIDVEN in the last 168 hours.  Recent  Results (from the past 240 hour(s))  SARS Coronavirus 2 by RT PCR (hospital order, performed in [facility] hospital lab) Nasopharyngeal Nasopharyngeal Swab     Status: None   Collection Time: 12/29/19  5:15 PM   Specimen: Nasopharyngeal Swab  Result Value Ref Range Status   SARS Coronavirus 2 NEGATIVE NEGATIVE Final    Comment: (NOTE) SARS-CoV-2 target nucleic acids are NOT DETECTED.  The SARS-CoV-2 RNA is generally detectable in upper and lower respiratory specimens during the acute phase of infection. The lowest concentration of SARS-CoV-2 viral copies this assay can detect is 250 copies / mL. A negative result does not preclude SARS-CoV-2 infection and should not be used as the sole basis for treatment or other patient management decisions.  A negative result may occur with improper specimen collection / handling, submission of specimen other than nasopharyngeal swab, presence of viral mutation(s) within the areas targeted by this assay,  and inadequate number of viral copies (<250 copies / mL). A negative result must be combined with clinical observations, patient history, and epidemiological information.  Fact Sheet for Patients:   StrictlyIdeas.no  Fact Sheet for Healthcare Providers: BankingDealers.co.za  This test is not yet approved or  cleared by the [place] FDA and has been authorized for detection and/or diagnosis of SARS-CoV-2 by FDA under an Emergency Use Authorization (EUA).  This EUA will remain in effect (meaning this test can be used) for the duration of the COVID-19 declaration under Section 564(b)(1) of the Act, 21 U.S.C. section 360bbb-3(b)(1), unless the authorization is terminated or revoked sooner.  Performed at [facility], [address] [place], [place] [address]          Radiology Studies: CT Angio Chest PE W and/or Wo Contrast  Result Date: 12/29/2019 CLINICAL DATA:  Shortness  of breath, low O2 sats EXAM: CT ANGIOGRAPHY CHEST WITH CONTRAST TECHNIQUE: Multidetector CT imaging of the chest was performed using the standard protocol during bolus administration of intravenous contrast. Multiplanar CT image reconstructions and MIPs were obtained to evaluate the vascular anatomy. CONTRAST:  42mL OMNIPAQUE IOHEXOL 350 MG/ML SOLN COMPARISON:  None. FINDINGS: Cardiovascular: Several pulmonary emboli are seen within the right pulmonary arteries involving all lobes. Small pulmonary emboli within a left lower lobe pulmonary arterial subsegmental branch. No evidence of right heart strain. Heart is normal size. Aorta is normal caliber. Mediastinum/Nodes: No mediastinal, hilar, or axillary adenopathy. Trachea and esophagus are unremarkable. Thyroid unremarkable. Lungs/Pleura: Lungs are clear. No focal airspace opacities or suspicious nodules. No effusions. Upper Abdomen: Imaging into the upper abdomen shows no acute findings. Musculoskeletal: Chest wall soft tissues are unremarkable. No acute bony abnormality. Review of the MIP images confirms the above findings. IMPRESSION: Multiple right pulmonary emboli within the central pulmonary arteries and branches in all lobes of the right lung. Small subsegmental left lower lobe pulmonary embolus. No evidence of right heart strain. Critical Value/emergent results were called by telephone at the time of interpretation on 12/29/2019 at 6:35 pm to provider JULIE HAVILAND , who verbally acknowledged these results. Electronically Signed   By: Rolm Baptise M.D.   On: 12/29/2019 18:36   DG Chest Portable 1 View  Result Date: 12/29/2019 CLINICAL DATA:  Chest pain, shortness of breath EXAM: PORTABLE CHEST 1 VIEW COMPARISON:  11/24/2018 FINDINGS: Heart and mediastinal contours are within normal limits. No focal opacities or effusions. No acute bony abnormality. IMPRESSION: Normal study. Electronically Signed   By: Rolm Baptise M.D.   On: 12/29/2019 17:57         Scheduled Meds: . apixaban  10 mg Oral BID   Followed by  . [START ON 01/06/2020] apixaban  5 mg Oral BID  . atorvastatin  20 mg Oral Daily  . busPIRone  15 mg Oral BID  . losartan  50 mg Oral Daily   Continuous Infusions: . heparin 1,500 Units/hr (12/30/19 0545)     LOS: 1 day    Time spent: More than 50% of that time was spent in counseling and/or coordination of care.      Shelly Coss, MD [facility] P7/08/2019, 8:44 AM

## 2019-12-31 LAB — CBC WITH DIFFERENTIAL/PLATELET
Abs Immature Granulocytes: 0.07 10*3/uL (ref 0.00–0.07)
Basophils Absolute: 0.1 10*3/uL (ref 0.0–0.1)
Basophils Relative: 0 %
Eosinophils Absolute: 0.1 10*3/uL (ref 0.0–0.5)
Eosinophils Relative: 1 %
HCT: 44.3 % (ref 39.0–52.0)
Hemoglobin: 15 g/dL (ref 13.0–17.0)
Immature Granulocytes: 0 %
Lymphocytes Relative: 16 %
Lymphs Abs: 2.5 10*3/uL (ref 0.7–4.0)
MCH: 29.6 pg (ref 26.0–34.0)
MCHC: 33.9 g/dL (ref 30.0–36.0)
MCV: 87.5 fL (ref 80.0–100.0)
Monocytes Absolute: 1.5 10*3/uL — ABNORMAL HIGH (ref 0.1–1.0)
Monocytes Relative: 10 %
Neutro Abs: 11.3 10*3/uL — ABNORMAL HIGH (ref 1.7–7.7)
Neutrophils Relative %: 73 %
Platelets: 267 10*3/uL (ref 150–400)
RBC: 5.06 MIL/uL (ref 4.22–5.81)
RDW: 12.4 % (ref 11.5–15.5)
WBC: 15.6 10*3/uL — ABNORMAL HIGH (ref 4.0–10.5)
nRBC: 0 % (ref 0.0–0.2)

## 2019-12-31 NOTE — Progress Notes (Signed)
PROGRESS NOTE    Jackson Sherman  JHE:[id number] DOB: 01-27-1961 DOA: 12/29/2019 PCP: [name], MD   Brief Narrative: Patient is a 59 year old male with history of hypertension, hyperlipidemia, depression, generalized anxiety disorder who presents to [facility] emergency department with complaint of chest pain, shortness of breath for last 3 to 4 days. He was hypoxic, tachycardic, tachypneic on presentation. Work-up revealed leukocytosis, mild AKI. CT angiogram showed PE bilaterally. Started on heparin drip which has been change to Eliquis today.  Currently he is maintaining his saturation on room air but he became severely tachyonic and tachypneic after the physical therapy assessment so we decided to cancel the discharge today. Plan for DC tomorrow  Assessment & Plan:   Principal Problem:   Pulmonary embolus (HCC) Active Problems:   Hyperlipidemia   OBESITY, NOS   Essential hypertension   Unipolar depression (HCC)   Pulmonary embolism (HCC)   Anxiety   Acute on chronic respiratory failure with hypoxia (HCC)   Chest pain   Acute respiratory failure with hypoxia: Secondary to pulmonary embolism. Currently on room air. He is not on oxygen at home. Lungs were clear on auscultation today. We will try to  monitor on room air.  He became tachypneic after the physical therapy assessment.  Acute PE:CT angiogram PE of the chest with contrast showed multiple right pulmonary emboli within the central pulmonary arteries and branches in all lobes of the right lung.  Small subsegmental left lower lobe pulmonary embolus with no evidence of right heart strain. Started on heparin drip.Chnaged to  Eliquis . Currently he is hemodynamically stable.  Echocardiogram showed ejection fraction of 60-65%, mildly reduced right ventricular systolic function.venous duplex did not show any DVT.  Most likely ,he be on lifelong anticoagulation. No history of clots in the past or in  family.  Chest pain: Due to pulmonary embolism. Resolved  Hypertension: Continue home losartan. Monitor blood pressure  Hyperlipidemia: Takes Lipitor at home.  Depression/anxiety: Restarted home medications.  Morbid obesity: BMI of 38  Leukocytosis: Most likely reactive. Continue to monitor CBC. No indication for antibiotic therapy. No signs of infection.         DVT prophylaxis:Eliquis Code Status: Full Family Communication: Attempted to call the wife twice, connection unsuccessful. Status is: Inpatient  Remains inpatient appropriate because:IV treatments appropriate due to intensity of illness or inability to take PO   Dispo: The patient is from: Home              Anticipated d/c is to: Home              Anticipated d/c date is: 1 day              Patient currently is not medically stable to d/c. Patient became tachycardic and tachypneic after the physical therapy assessment.  Not safe for discharge today.  Plan for discharge tomorrow.  Consultants: None  Procedures:None  Antimicrobials:  Anti-infectives (From admission, onward)   None      Subjective:  Patient seen and examined at the bedside this morning. Was still tachycardic in the range of 110s.  Maintaining saturation on room air.  Denies any chest pain.  Objective: Vitals:   12/30/19 1729 12/30/19 1952 12/30/19 2344 12/31/19 0751  BP:  (!) 125/97 117/74 139/90  Pulse:  82  (!) 109  Resp: (!) 24  19 20   Temp: 98.6 F (37 C) 98 F (36.7 C) 98 F (36.7 C) 97.9 F (36.6 C)  TempSrc: Oral Oral Oral Oral  SpO2:    96%  Weight:      Height:        Intake/Output Summary (Last 24 hours) at 12/31/2019 1106 Last data filed at 12/31/2019 0600 Gross per 24 hour  Intake --  Output 900 ml  Net -900 ml   Filed Weights   12/29/19 1652  Weight: 120.2 kg    Examination:  General exam: Comfortable, morbidly obese Respiratory system: Bilateral equal air entry, normal vesicular breath sounds, no wheezes  or crackles  Cardiovascular system: S1 & S2 heard, RRR. No JVD, murmurs, rubs, gallops or clicks. Gastrointestinal system: Abdomen is nondistended, soft and nontender. No organomegaly or masses felt. Normal bowel sounds heard. Central nervous system: Alert and oriented. No focal neurological deficits. Extremities: No edema, no clubbing ,no cyanosis, distal peripheral pulses palpable. Skin: No rashes, lesions or ulcers,no icterus ,no pallor   Data Reviewed: I have personally reviewed following labs and imaging studies  CBC: Recent Labs  Lab 12/29/19 1708 12/30/19 0234 12/31/19 0356  WBC 17.7* 15.9* 15.6*  NEUTROABS 12.2*  --  11.3*  HGB 16.3 15.7 15.0  HCT 46.4 46.3 44.3  MCV 85.6 87.4 87.5  PLT 321 293 086   Basic Metabolic Panel: Recent Labs  Lab 12/29/19 1708 12/30/19 0234  NA 134* 134*  K 3.7 3.8  CL 100 100  CO2 21* 22  GLUCOSE 131* 134*  BUN 21* 17  CREATININE 1.25* 1.13  CALCIUM 8.6* 8.6*  MG  --  2.0  PHOS  --  3.9   GFR: Estimated Creatinine Clearance: 90.4 mL/min (by C-G formula based on SCr of 1.13 mg/dL). Liver Function Tests: Recent Labs  Lab 12/29/19 1708 12/30/19 0234  AST 21 19  ALT 22 23  ALKPHOS 92 78  BILITOT 0.9 1.0  PROT 8.2* 7.3  ALBUMIN 3.9 3.4*   No results for input(s): LIPASE, AMYLASE in the last 168 hours. No results for input(s): AMMONIA in the last 168 hours. Coagulation Profile: Recent Labs  Lab 12/30/19 0234  INR 1.1   Cardiac Enzymes: No results for input(s): CKTOTAL, CKMB, CKMBINDEX, TROPONINI in the last 168 hours. BNP (last 3 results) No results for input(s): PROBNP in the last 8760 hours. HbA1C: No results for input(s): HGBA1C in the last 72 hours. CBG: No results for input(s): GLUCAP in the last 168 hours. Lipid Profile: No results for input(s): CHOL, HDL, LDLCALC, TRIG, CHOLHDL, LDLDIRECT in the last 72 hours. Thyroid Function Tests: No results for input(s): TSH, T4TOTAL, FREET4, T3FREE, THYROIDAB in the last  72 hours. Anemia Panel: No results for input(s): VITAMINB12, FOLATE, FERRITIN, TIBC, IRON, RETICCTPCT in the last 72 hours. Sepsis Labs: No results for input(s): PROCALCITON, LATICACIDVEN in the last 168 hours.  Recent Results (from the past 240 hour(s))  SARS Coronavirus 2 by RT PCR (hospital order, performed in [facility] hospital lab) Nasopharyngeal Nasopharyngeal Swab     Status: None   Collection Time: 12/29/19  5:15 PM   Specimen: Nasopharyngeal Swab  Result Value Ref Range Status   SARS Coronavirus 2 NEGATIVE NEGATIVE Final    Comment: (NOTE) SARS-CoV-2 target nucleic acids are NOT DETECTED.  The SARS-CoV-2 RNA is generally detectable in upper and lower respiratory specimens during the acute phase of infection. The lowest concentration of SARS-CoV-2 viral copies this assay can detect is 250 copies / mL. A negative result does not preclude SARS-CoV-2 infection and should not be used as the sole basis for treatment or other patient  management decisions.  A negative result may occur with improper specimen collection / handling, submission of specimen other than nasopharyngeal swab, presence of viral mutation(s) within the areas targeted by this assay, and inadequate number of viral copies (<250 copies / mL). A negative result must be combined with clinical observations, patient history, and epidemiological information.  Fact Sheet for Patients:   StrictlyIdeas.no  Fact Sheet for Healthcare Providers: BankingDealers.co.za  This test is not yet approved or  cleared by the [place] FDA and has been authorized for detection and/or diagnosis of SARS-CoV-2 by FDA under an Emergency Use Authorization (EUA).  This EUA will remain in effect (meaning this test can be used) for the duration of the COVID-19 declaration under Section 564(b)(1) of the Act, 21 U.S.C. section 360bbb-3(b)(1), unless the authorization is terminated  or revoked sooner.  Performed at [facility], [address] [place], [place] [address]          Radiology Studies: CT Angio Chest PE W and/or Wo Contrast  Result Date: 12/29/2019 CLINICAL DATA:  Shortness of breath, low O2 sats EXAM: CT ANGIOGRAPHY CHEST WITH CONTRAST TECHNIQUE: Multidetector CT imaging of the chest was performed using the standard protocol during bolus administration of intravenous contrast. Multiplanar CT image reconstructions and MIPs were obtained to evaluate the vascular anatomy. CONTRAST:  17mL OMNIPAQUE IOHEXOL 350 MG/ML SOLN COMPARISON:  None. FINDINGS: Cardiovascular: Several pulmonary emboli are seen within the right pulmonary arteries involving all lobes. Small pulmonary emboli within a left lower lobe pulmonary arterial subsegmental branch. No evidence of right heart strain. Heart is normal size. Aorta is normal caliber. Mediastinum/Nodes: No mediastinal, hilar, or axillary adenopathy. Trachea and esophagus are unremarkable. Thyroid unremarkable. Lungs/Pleura: Lungs are clear. No focal airspace opacities or suspicious nodules. No effusions. Upper Abdomen: Imaging into the upper abdomen shows no acute findings. Musculoskeletal: Chest wall soft tissues are unremarkable. No acute bony abnormality. Review of the MIP images confirms the above findings. IMPRESSION: Multiple right pulmonary emboli within the central pulmonary arteries and branches in all lobes of the right lung. Small subsegmental left lower lobe pulmonary embolus. No evidence of right heart strain. Critical Value/emergent results were called by telephone at the time of interpretation on 12/29/2019 at 6:35 pm to provider JULIE HAVILAND , who verbally acknowledged these results. Electronically Signed   By: Rolm Baptise M.D.   On: 12/29/2019 18:36   DG Chest Portable 1 View  Result Date: 12/29/2019 CLINICAL DATA:  Chest pain, shortness of breath EXAM: PORTABLE CHEST 1 VIEW COMPARISON:  11/24/2018  FINDINGS: Heart and mediastinal contours are within normal limits. No focal opacities or effusions. No acute bony abnormality. IMPRESSION: Normal study. Electronically Signed   By: Rolm Baptise M.D.   On: 12/29/2019 17:57   ECHOCARDIOGRAM COMPLETE  Result Date: 12/30/2019    ECHOCARDIOGRAM REPORT   Patient Name:   Thaddeus L Lawhorn Date of Exam: 12/30/2019 Medical Rec #:  [id number]         Height:       70.0 in Accession #:    [id number]        Weight:       265.0 lb Date of Birth:  1960/09/21         BSA:          2.353 m Patient Age:    59 years          BP:           136/94 mmHg Patient Gender:  M                 HR:           85 bpm. Exam Location:  Inpatient Procedure: 2D Echo, Cardiac Doppler, Color Doppler and Intracardiac            Opacification Agent Indications:    Chest pain  History:        Patient has no prior history of Echocardiogram examinations.                 Signs/Symptoms:Shortness of Breath, Chest Pain and pulmonary                 emboli; Risk Factors:Hypertension, Dyslipidemia and Obesity.  Sonographer:    Dustin Flock Referring Phys: [id number] OLADAPO ADEFESO IMPRESSIONS  1. Left ventricular ejection fraction, by estimation, is 60 to 65%. The left ventricle has normal function. The left ventricle has no regional wall motion abnormalities. There is moderate left ventricular hypertrophy. Left ventricular diastolic parameters are indeterminate.  2. Right ventricular systolic function is mildly reduced. The right ventricular size is normal. Tricuspid regurgitation signal is inadequate for assessing PA pressure.  3. The mitral valve is grossly normal. Mild mitral valve regurgitation.  4. The aortic valve is tricuspid. Aortic valve regurgitation is not visualized. FINDINGS  Left Ventricle: Left ventricular ejection fraction, by estimation, is 60 to 65%. The left ventricle has normal function. The left ventricle has no regional wall motion abnormalities. The left ventricular internal cavity  size was normal in size. There is  moderate left ventricular hypertrophy. Left ventricular diastolic parameters are indeterminate. Right Ventricle: The right ventricular size is normal. No increase in right ventricular wall thickness. Right ventricular systolic function is mildly reduced. Tricuspid regurgitation signal is inadequate for assessing PA pressure. Left Atrium: Left atrial size was normal in size. Right Atrium: Right atrial size was normal in size. Pericardium: There is no evidence of pericardial effusion. Mitral Valve: The mitral valve is grossly normal. Mild mitral valve regurgitation. Tricuspid Valve: The tricuspid valve is grossly normal. Tricuspid valve regurgitation is trivial. Aortic Valve: The aortic valve is tricuspid. Aortic valve regurgitation is not visualized. Mild aortic valve annular calcification. Pulmonic Valve: The pulmonic valve was grossly normal. Pulmonic valve regurgitation is trivial. Aorta: The aortic root is normal in size and structure. IAS/Shunts: No atrial level shunt detected by color flow Doppler.  LEFT VENTRICLE PLAX 2D LVIDd:         3.70 cm  Diastology LVIDs:         2.50 cm  LV e' lateral:   9.90 cm/s LV PW:         1.70 cm  LV E/e' lateral: 5.4 LV IVS:        1.90 cm  LV e' medial:    9.57 cm/s LVOT diam:     2.10 cm  LV E/e' medial:  5.5 LV SV:         43 LV SV Index:   18 LVOT Area:     3.46 cm  RIGHT VENTRICLE RV Basal diam:  4.60 cm RV S prime:     25.00 cm/s TAPSE (M-mode): 3.6 cm LEFT ATRIUM           Index       RIGHT ATRIUM           Index LA diam:      3.70 cm 1.57 cm/m  RA Area:     15.20 cm LA Vol (  A4C): 52.2 ml 22.19 ml/m RA Volume:   39.00 ml  16.58 ml/m  AORTIC VALVE LVOT Vmax:   79.00 cm/s LVOT Vmean:  45.500 cm/s LVOT VTI:    0.125 m  AORTA Ao Root diam: 3.60 cm MITRAL VALVE MV Area (PHT): 6.12 cm    SHUNTS MV Decel Time: 124 msec    Systemic VTI:  0.12 m MV E velocity: 53.10 cm/s  Systemic Diam: 2.10 cm MV A velocity: 74.10 cm/s MV E/A ratio:  0.72  Rozann Lesches MD Electronically signed by Rozann Lesches MD Signature Date/Time: 12/30/2019/12:27:00 PM    Final    VAS [place] LOWER EXTREMITY VENOUS (DVT)  Result Date: 12/30/2019  Lower Venous DVTStudy Indications: Swelling, and Edema.  Comparison Study: no prior Performing Technologist: Abram Sander RVS  Examination Guidelines: A complete evaluation includes B-mode imaging, spectral Doppler, color Doppler, and power Doppler as needed of all accessible portions of each vessel. Bilateral testing is considered an integral part of a complete examination. Limited examinations for reoccurring indications may be performed as noted. The reflux portion of the exam is performed with the patient in reverse Trendelenburg.  +---------+---------------+---------+-----------+----------+--------------+ RIGHT    CompressibilityPhasicitySpontaneityPropertiesThrombus Aging +---------+---------------+---------+-----------+----------+--------------+ CFV      Full           Yes      Yes                                 +---------+---------------+---------+-----------+----------+--------------+ SFJ      Full                                                        +---------+---------------+---------+-----------+----------+--------------+ FV Prox  Full                                                        +---------+---------------+---------+-----------+----------+--------------+ FV Mid   Full                                                        +---------+---------------+---------+-----------+----------+--------------+ FV DistalFull                                                        +---------+---------------+---------+-----------+----------+--------------+ PFV      Full                                                        +---------+---------------+---------+-----------+----------+--------------+ POP      Full           Yes      Yes                                  +---------+---------------+---------+-----------+----------+--------------+  PTV      Full                                                        +---------+---------------+---------+-----------+----------+--------------+ PERO     Full                                                        +---------+---------------+---------+-----------+----------+--------------+   +---------+---------------+---------+-----------+----------+--------------+ LEFT     CompressibilityPhasicitySpontaneityPropertiesThrombus Aging +---------+---------------+---------+-----------+----------+--------------+ CFV      Full           Yes      Yes                                 +---------+---------------+---------+-----------+----------+--------------+ SFJ      Full                                                        +---------+---------------+---------+-----------+----------+--------------+ FV Prox  Full                                                        +---------+---------------+---------+-----------+----------+--------------+ FV Mid   Full                                                        +---------+---------------+---------+-----------+----------+--------------+ FV DistalFull                                                        +---------+---------------+---------+-----------+----------+--------------+ PFV      Full                                                        +---------+---------------+---------+-----------+----------+--------------+ POP      Full           Yes      Yes                                 +---------+---------------+---------+-----------+----------+--------------+ PTV      Full                                                        +---------+---------------+---------+-----------+----------+--------------+  PERO     Full                                                         +---------+---------------+---------+-----------+----------+--------------+     Summary: BILATERAL: - No evidence of deep vein thrombosis seen in the lower extremities, bilaterally. -No evidence of popliteal cyst, bilaterally.   *See table(s) above for measurements and observations. Electronically signed by Monica Martinez MD on 12/30/2019 at 3:16:31 PM.    Final         Scheduled Meds: . apixaban  10 mg Oral BID   Followed by  . [START ON 01/06/2020] apixaban  5 mg Oral BID  . atorvastatin  20 mg Oral Daily  . brexpiprazole  2 mg Oral Daily  . busPIRone  15 mg Oral BID  . escitalopram  20 mg Oral Daily  . losartan  50 mg Oral Daily  . traZODone  100 mg Oral QHS   Continuous Infusions:    LOS: 2 days    Time spent: 25 mins.More than 50% of that time was spent in counseling and/or coordination of care.      Shelly Coss, MD [facility] P7/09/2019, 11:06 AM

## 2019-12-31 NOTE — Evaluation (Signed)
Physical Therapy Evaluation Patient Details Name: Jackson Sherman MRN: [id number] DOB: 1960/09/29 Today's Date: 12/31/2019   History of Present Illness  59 y.o. 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 Pt with history of pleural effusion and presents to ED on 7/2 with reports of SOB and chest pain. Pt underwent pleurex catheter placement on 12/29/2019.  Clinical Impression  Pt presents to PT with deficits in cardiopulmonary endurance and activity tolerance. Pt mobilizes independently at this time with no significant strength or mobility deficits observed, but does fatigue much more quickly than at baseline. Pt ambulating on room air during session and does desaturate briefly to 89% at end of walk, however recovers quickly with a seated rest. PT provides education on the benefits of a cardiopulmonary rehab program however the patient declines at this time, preferring to wait until after follow-up with MD in the outpatient setting. PT provides further encouragement on energy conservation techniques and the needs for a gradual return to daily activities and exercise. Acute PT services will continue to follow during this admission.    Follow Up Recommendations Other (comment) (cardiopulmonary rehab, pt deferring until follow-up)    Equipment Recommendations  None recommended by PT    Recommendations for Other Services       Precautions / Restrictions Precautions Precautions: Other (comment) Precaution Comments: monitor SpO2 and HR Restrictions Weight Bearing Restrictions: No      Mobility  Bed Mobility               General bed mobility comments: pt received sitting qat edge of bed and left sitting in recliner  Transfers Overall transfer level: Independent                  Ambulation/Gait Ambulation/Gait  assistance: Independent Gait Distance (Feet): 150 Feet Assistive device: None Gait Pattern/deviations: Step-through pattern Gait velocity: functional Gait velocity interpretation: 1.31 - 2.62 ft/sec, indicative of limited community ambulator General Gait Details: pt with steady step through gait, no significant gait deviations noted  Stairs Stairs: Yes Stairs assistance: Supervision Stair Management: One rail Left Number of Stairs: 3    Wheelchair Mobility    Modified Rankin (Stroke Patients Only)       Balance Overall balance assessment: Mild deficits observed, not formally tested                                           Pertinent Vitals/Pain Pain Assessment: No/denies pain    Home Living Family/patient expects to be discharged to:: Private residence Living Arrangements: Spouse/significant other Available Help at Discharge: Family;Available 24 hours/day Type of Home: House Home Access: Stairs to enter Entrance Stairs-Rails: Left Entrance Stairs-Number of Steps: 2 Home Layout: One level Home Equipment: Walker - 2 wheels;Bedside commode;Shower seat      Prior Function Level of Independence: Independent               Hand Dominance   Dominant Hand: Left    Extremity/Trunk Assessment   Upper Extremity Assessment Upper Extremity Assessment: Overall WFL for tasks assessed    Lower Extremity Assessment Lower Extremity Assessment: Overall WFL for tasks assessed       Communication   Communication: No difficulties  Cognition Arousal/Alertness: Awake/alert Behavior During Therapy: WFL for tasks  assessed/performed Overall Cognitive Status: Within Functional Limits for tasks assessed                                        General Comments General comments (skin integrity, edema, etc.): pt on RA for session, saturatign at 95% at rest with HR in 110s. Pt HR increases to a max of 127 with ambulation and stair negotiation,  SpO2 does desat to 89% at end of ambulation with saturation ranging from 90-95% for most of walk. Pt does report SOB at end of session but recovers within 1 minute of seated rest.    Exercises     Assessment/Plan    PT Assessment Patient needs continued PT services  PT Problem List Decreased activity tolerance;Decreased mobility;Cardiopulmonary status limiting activity       PT Treatment Interventions Gait training;Stair training;Patient/family education;Other (comment) (cardiopulmonary endurance)    PT Goals (Current goals can be found in the Care Plan section)  Acute Rehab PT Goals Patient Stated Goal: To return to prior level of funciton PT Goal Formulation: With patient Time For Goal Achievement: 01/14/20 Potential to Achieve Goals: Good Additional Goals Additional Goal #1: Pt will independently verbalize at least 3 energy conservation techniques    Frequency Min 3X/week   Barriers to discharge        Co-evaluation               AM-PAC PT "6 Clicks" Mobility  Outcome Measure Help needed turning from your back to your side while in a flat bed without using bedrails?: None Help needed moving from lying on your back to sitting on the side of a flat bed without using bedrails?: None Help needed moving to and from a bed to a chair (including a wheelchair)?: None Help needed standing up from a chair using your arms (e.g., wheelchair or bedside chair)?: None Help needed to walk in hospital room?: None Help needed climbing 3-5 steps with a railing? : None 6 Click Score: 24    End of Session   Activity Tolerance: Patient tolerated treatment well Patient left: in chair;with call bell/phone within reach Nurse Communication: Mobility status PT Visit Diagnosis: Other (comment) (cardiopulmonary endurance)    Time: 9622-2979 PT Time Calculation (min) (ACUTE ONLY): 17 min   Charges:   PT Evaluation $PT Eval Moderate Complexity: 1 Mod          Zenaida Niece, PT,  DPT Acute Rehabilitation Pager: [phone]   Zenaida Niece 12/31/2019, 10:26 AM

## 2019-12-31 NOTE — Discharge Instructions (Signed)
Information on my medicine - ELIQUIS (apixaban)  This medication education was reviewed with me or my healthcare representative as part of my discharge preparation.  The pharmacist that spoke with me during my hospital stay was:  Onnie Boer, RPH-CPP  Why was Eliquis prescribed for you? 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  What do You need to know about Eliquis ? The starting dose is 10 mg (two 5 mg tablets) taken TWICE daily for the FIRST SEVEN (7) DAYS, then on (enter date)  01/06/20  the dose is reduced to ONE 5 mg tablet taken TWICE daily.  Eliquis may be taken with or without food.   Try to take the dose about the same time in the morning and in the evening. If you have difficulty swallowing the tablet whole please discuss with your pharmacist how to take the medication safely.  Take Eliquis exactly as prescribed and DO NOT stop taking Eliquis without talking to the doctor who prescribed the medication.  Stopping may increase your risk of developing a new blood clot.  Refill your prescription before you run out.  After discharge, you should have regular check-up appointments with your healthcare provider that is prescribing your Eliquis.    What do you do if you miss a dose? If a dose of ELIQUIS is not taken at the scheduled time, take it as soon as possible on the same day and twice-daily administration should be resumed. The dose should not be doubled to make up for a missed dose.  Important Safety Information A possible side effect of Eliquis is bleeding. You should call your healthcare provider right away if you experience any of the following: ? Bleeding from an injury or your nose that does not stop. ? Unusual colored urine (red or dark brown) or unusual colored stools (red or black). ? Unusual bruising for  unknown reasons. ? A serious fall or if you hit your head (even if there is no bleeding).  Some medicines may interact with Eliquis and might increase your risk of bleeding or clotting while on Eliquis. To help avoid this, consult your healthcare provider or pharmacist prior to using any new prescription or non-prescription medications, including herbals, vitamins, non-steroidal anti-inflammatory drugs (NSAIDs) and supplements.  This website has more information on Eliquis (apixaban): http://www.eliquis.com/eliquis/home

## 2020-01-01 LAB — LUPUS ANTICOAGULANT PANEL
DRVVT: 46 s (ref 0.0–47.0)
PTT Lupus Anticoagulant: 34.5 s (ref 0.0–51.9)

## 2020-01-01 LAB — CBC
HCT: 40.5 % (ref 39.0–52.0)
Hemoglobin: 13.6 g/dL (ref 13.0–17.0)
MCH: 29.4 pg (ref 26.0–34.0)
MCHC: 33.6 g/dL (ref 30.0–36.0)
MCV: 87.5 fL (ref 80.0–100.0)
Platelets: 217 10*3/uL (ref 150–400)
RBC: 4.63 MIL/uL (ref 4.22–5.81)
RDW: 12.4 % (ref 11.5–15.5)
WBC: 14.6 10*3/uL — ABNORMAL HIGH (ref 4.0–10.5)
nRBC: 0 % (ref 0.0–0.2)

## 2020-01-01 LAB — CARDIOLIPIN ANTIBODIES, IGG, IGM, IGA
Anticardiolipin IgA: <9 APL U/mL (ref 0–11)
Anticardiolipin IgG: <9 GPL U/mL (ref 0–14)
Anticardiolipin IgM: <9 MPL U/mL (ref 0–12)

## 2020-01-01 LAB — PROTEIN C ACTIVITY: Protein C Activity: 111 % (ref 73–180)

## 2020-01-01 LAB — BETA-2-GLYCOPROTEIN I ABS, IGG/M/A
Beta-2 Glyco I IgG: <9 GPI IgG units (ref 0–20)
Beta-2-Glycoprotein I IgA: <9 GPI IgA units (ref 0–25)
Beta-2-Glycoprotein I IgM: <9 GPI IgM units (ref 0–32)

## 2020-01-01 LAB — PROTEIN S, TOTAL: Protein S Ag, Total: 132 % (ref 60–150)

## 2020-01-01 LAB — PROTEIN S ACTIVITY: Protein S Activity: 94 % (ref 63–140)

## 2020-01-01 LAB — TSH: TSH: 1.042 u[IU]/mL (ref 0.350–4.500)

## 2020-01-01 MED ORDER — APIXABAN 5 MG PO TABS: 10.0000 mg | ORAL_TABLET | Freq: Two times a day (BID) | ORAL | 0 refills | Status: DC

## 2020-01-01 MED ORDER — METOPROLOL TARTRATE 12.5 MG HALF TABLET
12.5000 mg | ORAL_TABLET | Freq: Two times a day (BID) | ORAL | Status: DC
  Administered 2020-01-01: 12.5 mg via ORAL
  Filled 2020-01-01: qty 1

## 2020-01-01 MED ORDER — APIXABAN 5 MG PO TABS: 5.0000 mg | ORAL_TABLET | Freq: Two times a day (BID) | ORAL | 1 refills | Status: DC

## 2020-01-01 MED ORDER — METOPROLOL TARTRATE 25 MG PO TABS: 12.5000 mg | ORAL_TABLET | Freq: Two times a day (BID) | ORAL | 0 refills | Status: DC

## 2020-01-01 MED FILL — METOPROLOL TARTRATE 25 MG T: 25 | 60 days supply | Qty: 60 | Fill #0

## 2020-01-01 MED FILL — ELIQUIS 5 MG TABLET: 5 | 30 days supply | Qty: 74 | Fill #0

## 2020-01-01 NOTE — TOC Benefit Eligibility Note (Signed)
Transition of Care [facility]) Benefit Eligibility Note    Patient Details  Name: Jackson Sherman MRN: [id number] Date of Birth: 10-19-1960   Medication/Dose: Arne Cleveland  5 M G BID  Covered?: Yes  Tier:  (NO TIER)  Prescription Coverage Preferred Pharmacy: [facility]   and    [facility]  Spoke with Person/Company/Phone Number:: CAROL  @ [employer] # [phone]  Co-Pay: $  120.68  Prior Approval: No  Deductible:  (NO  DEDUCTIBLE   /  OUT-OF-POCKET:UNMET)       [name] Phone Number: 01/01/2020, 12:39 PM

## 2020-01-01 NOTE — Discharge Summary (Signed)
Physician Discharge Summary  MIKHAEL HENDRIKS IRS:[id number] DOB: [birth year] DOA: 12/29/2019  PCP: [name], MD  Admit date: 12/29/2019 Discharge date: 01/01/2020  Admitted From: Home Disposition:  Home  Discharge Condition:Stable CODE STATUS:FULL Diet recommendation: Heart Healthy   Brief/Interim Summary: Patient is a 59 year old male with history of hypertension, hyperlipidemia, depression, generalized anxiety disorder who presents to [facility] emergency department with complaint of chest pain, shortness of breath for last 3 to 4 days. He was hypoxic, tachycardic, tachypneic on presentation. Work-up revealed leukocytosis, mild AKI. CT angiogram showed PE bilaterally. Started on heparin drip which has been change to Eliquis today.  Currently he is maintaining his saturation on room air.  Patient is medically stable for discharge home today with Eliquis.  Following problems were addressed during his hospitalization:   Acute respiratory failure with hypoxia: Secondary to pulmonary embolism. Currently on room air. He is not on oxygen at home. Lungs were clear on auscultation today. PT evaluation done, no follow-up recommended.  Acute PE:CT angiogram PE of the chest with contrast showed multiple right pulmonary emboli within the central pulmonary arteries and branches in all lobes of the right lung. Small subsegmental left lower lobe pulmonary embolus with no evidence of right heart strain. Started on heparin drip.Chnaged to  Eliquis . Currently he is hemodynamically stable.  Echocardiogram showed ejection fraction of 60-65%, mildly reduced right ventricular systolic function.venous duplex did not show any DVT.  Most likely ,he be on lifelong anticoagulation. No history of clots in the past or in family.  Hypercoagulable panel pending. He has mild sinus tachycardia with heart rate ranging from 90s to 110s.  TSH normal.  Started on low-dose metoprolol.  Chest pain: Due to  pulmonary embolism. Resolved  Hypertension: Continue home losartan.   Hyperlipidemia: Takes Lipitor at home.  Depression/anxiety: Restarted home medications.  Morbid obesity: BMI of 38  Leukocytosis: Most likely reactive. Continue to monitor CBC. No indication for antibiotic therapy. No signs of infection.  Check CBC in a week during follow-up with the PCP.  Discharge Diagnoses:  Principal Problem:   Pulmonary embolus ([place]) Active Problems:   Hyperlipidemia   OBESITY, NOS   Essential hypertension   Unipolar depression (HCC)   Pulmonary embolism (HCC)   Anxiety   Acute on chronic respiratory failure with hypoxia (HCC)   Chest pain    Discharge Instructions  Discharge Instructions    Diet - low sodium heart healthy   Complete by: As directed    Discharge instructions   Complete by: As directed    1)Please follow up with your PCP in a week. 2)Take prescribed medications as instructed.   Increase activity slowly   Complete by: As directed      Allergies as of 01/01/2020   No Known Allergies     Medication List    TAKE these medications   apixaban 5 MG Tabs tablet Commonly known as: ELIQUIS Take 2 tablets (10 mg total) by mouth 2 (two) times daily for 5 days. Start from today evening   apixaban 5 MG Tabs tablet Commonly known as: ELIQUIS Take 1 tablet (5 mg total) by mouth 2 (two) times daily. Start taking on: January 06, 2020   atorvastatin 20 MG tablet Commonly known as: LIPITOR Take 20 mg by mouth at bedtime   busPIRone 15 MG tablet Commonly known as: BUSPAR TAKE 1 TABLET (15 MG TOTAL) BY MOUTH 2 (TWO) TIMES DAILY.   diphenhydrAMINE 25 MG tablet Commonly known as: BENADRYL Take  25 mg by mouth at bedtime as needed for sleep.   escitalopram 20 MG tablet Commonly known as: LEXAPRO Take 1 tablet (20 mg total) by mouth daily.   indomethacin 50 MG capsule Commonly known as: INDOCIN Take 1 capsule (50 mg total) by mouth 3 (three) times daily with  meals.   loratadine 10 MG tablet Commonly known as: CLARITIN Take 10 mg by mouth daily.   losartan 50 MG tablet Commonly known as: COZAAR Take 1 tablet (50 mg total) by mouth daily.   metoprolol tartrate 25 MG tablet Commonly known as: LOPRESSOR Take 0.5 tablets (12.5 mg total) by mouth 2 (two) times daily.   naltrexone 50 MG tablet Commonly known as: DEPADE TAKE 1 TABLET (50 MG TOTAL) BY MOUTH DAILY.   Rexulti 2 MG Tabs tablet Generic drug: brexpiprazole Take 1 tablet (2 mg total) by mouth daily.   sildenafil 20 MG tablet Commonly known as: REVATIO Take 2-5 tablets (40-100 mg total) by mouth daily as needed.   traZODone 100 MG tablet Commonly known as: DESYREL TAKE 1 TABLET (100 MG TOTAL) BY MOUTH AT BEDTIME.       Follow-up Information    [name], MD. Schedule an appointment as soon as possible for a visit in 1 week(s).   Specialty: Family Medicine Contact information: [phone] [address] [place] [address] [phone]              No Known Allergies  Consultations:  None   Procedures/Studies: CT Angio Chest PE W and/or Wo Contrast  Result Date: 12/29/2019 CLINICAL DATA:  Shortness of breath, low O2 sats EXAM: CT ANGIOGRAPHY CHEST WITH CONTRAST TECHNIQUE: Multidetector CT imaging of the chest was performed using the standard protocol during bolus administration of intravenous contrast. Multiplanar CT image reconstructions and MIPs were obtained to evaluate the vascular anatomy. CONTRAST:  40mL OMNIPAQUE IOHEXOL 350 MG/ML SOLN COMPARISON:  None. FINDINGS: Cardiovascular: Several pulmonary emboli are seen within the right pulmonary arteries involving all lobes. Small pulmonary emboli within a left lower lobe pulmonary arterial subsegmental branch. No evidence of right heart strain. Heart is normal size. Aorta is normal caliber. Mediastinum/Nodes: No mediastinal, hilar, or axillary adenopathy. Trachea and esophagus are unremarkable.  Thyroid unremarkable. Lungs/Pleura: Lungs are clear. No focal airspace opacities or suspicious nodules. No effusions. Upper Abdomen: Imaging into the upper abdomen shows no acute findings. Musculoskeletal: Chest wall soft tissues are unremarkable. No acute bony abnormality. Review of the MIP images confirms the above findings. IMPRESSION: Multiple right pulmonary emboli within the central pulmonary arteries and branches in all lobes of the right lung. Small subsegmental left lower lobe pulmonary embolus. No evidence of right heart strain. Critical Value/emergent results were called by telephone at the time of interpretation on 12/29/2019 at 6:35 pm to provider JULIE HAVILAND , who verbally acknowledged these results. Electronically Signed   By: Rolm Baptise M.D.   On: 12/29/2019 18:36   DG Chest Portable 1 View  Result Date: 12/29/2019 CLINICAL DATA:  Chest pain, shortness of breath EXAM: PORTABLE CHEST 1 VIEW COMPARISON:  11/24/2018 FINDINGS: Heart and mediastinal contours are within normal limits. No focal opacities or effusions. No acute bony abnormality. IMPRESSION: Normal study. Electronically Signed   By: Rolm Baptise M.D.   On: 12/29/2019 17:57   ECHOCARDIOGRAM COMPLETE  Result Date: 12/30/2019    ECHOCARDIOGRAM REPORT   Patient Name:   Latoya L Matsuo Date of Exam: 12/30/2019 Medical Rec #:  [id number]  Height:       70.0 in Accession #:    [id number]        Weight:       265.0 lb Date of Birth:  10/22/1960         BSA:          2.353 m Patient Age:    5 years          BP:           136/94 mmHg Patient Gender: M                 HR:           85 bpm. Exam Location:  Inpatient Procedure: 2D Echo, Cardiac Doppler, Color Doppler and Intracardiac            Opacification Agent Indications:    Chest pain  History:        Patient has no prior history of Echocardiogram examinations.                 Signs/Symptoms:Shortness of Breath, Chest Pain and pulmonary                 emboli; Risk  Factors:Hypertension, Dyslipidemia and Obesity.  Sonographer:    Dustin Flock Referring Phys: [id number] OLADAPO ADEFESO IMPRESSIONS  1. Left ventricular ejection fraction, by estimation, is 60 to 65%. The left ventricle has normal function. The left ventricle has no regional wall motion abnormalities. There is moderate left ventricular hypertrophy. Left ventricular diastolic parameters are indeterminate.  2. Right ventricular systolic function is mildly reduced. The right ventricular size is normal. Tricuspid regurgitation signal is inadequate for assessing PA pressure.  3. The mitral valve is grossly normal. Mild mitral valve regurgitation.  4. The aortic valve is tricuspid. Aortic valve regurgitation is not visualized. FINDINGS  Left Ventricle: Left ventricular ejection fraction, by estimation, is 60 to 65%. The left ventricle has normal function. The left ventricle has no regional wall motion abnormalities. The left ventricular internal cavity size was normal in size. There is  moderate left ventricular hypertrophy. Left ventricular diastolic parameters are indeterminate. Right Ventricle: The right ventricular size is normal. No increase in right ventricular wall thickness. Right ventricular systolic function is mildly reduced. Tricuspid regurgitation signal is inadequate for assessing PA pressure. Left Atrium: Left atrial size was normal in size. Right Atrium: Right atrial size was normal in size. Pericardium: There is no evidence of pericardial effusion. Mitral Valve: The mitral valve is grossly normal. Mild mitral valve regurgitation. Tricuspid Valve: The tricuspid valve is grossly normal. Tricuspid valve regurgitation is trivial. Aortic Valve: The aortic valve is tricuspid. Aortic valve regurgitation is not visualized. Mild aortic valve annular calcification. Pulmonic Valve: The pulmonic valve was grossly normal. Pulmonic valve regurgitation is trivial. Aorta: The aortic root is normal in size and  structure. IAS/Shunts: No atrial level shunt detected by color flow Doppler.  LEFT VENTRICLE PLAX 2D LVIDd:         3.70 cm  Diastology LVIDs:         2.50 cm  LV e' lateral:   9.90 cm/s LV PW:         1.70 cm  LV E/e' lateral: 5.4 LV IVS:        1.90 cm  LV e' medial:    9.57 cm/s LVOT diam:     2.10 cm  LV E/e' medial:  5.5 LV SV:         43 LV SV  Index:   18 LVOT Area:     3.46 cm  RIGHT VENTRICLE RV Basal diam:  4.60 cm RV S prime:     25.00 cm/s TAPSE (M-mode): 3.6 cm LEFT ATRIUM           Index       RIGHT ATRIUM           Index LA diam:      3.70 cm 1.57 cm/m  RA Area:     15.20 cm LA Vol (A4C): 52.2 ml 22.19 ml/m RA Volume:   39.00 ml  16.58 ml/m  AORTIC VALVE LVOT Vmax:   79.00 cm/s LVOT Vmean:  45.500 cm/s LVOT VTI:    0.125 m  AORTA Ao Root diam: 3.60 cm MITRAL VALVE MV Area (PHT): 6.12 cm    SHUNTS MV Decel Time: 124 msec    Systemic VTI:  0.12 m MV E velocity: 53.10 cm/s  Systemic Diam: 2.10 cm MV A velocity: 74.10 cm/s MV E/A ratio:  0.72 Rozann Lesches MD Electronically signed by Rozann Lesches MD Signature Date/Time: 12/30/2019/12:27:00 PM    Final    VAS [place] LOWER EXTREMITY VENOUS (DVT)  Result Date: 12/30/2019  Lower Venous DVTStudy Indications: Swelling, and Edema.  Comparison Study: no prior Performing Technologist: Abram Sander RVS  Examination Guidelines: A complete evaluation includes B-mode imaging, spectral Doppler, color Doppler, and power Doppler as needed of all accessible portions of each vessel. Bilateral testing is considered an integral part of a complete examination. Limited examinations for reoccurring indications may be performed as noted. The reflux portion of the exam is performed with the patient in reverse Trendelenburg.  +---------+---------------+---------+-----------+----------+--------------+ RIGHT    CompressibilityPhasicitySpontaneityPropertiesThrombus Aging +---------+---------------+---------+-----------+----------+--------------+ CFV      Full            Yes      Yes                                 +---------+---------------+---------+-----------+----------+--------------+ SFJ      Full                                                        +---------+---------------+---------+-----------+----------+--------------+ FV Prox  Full                                                        +---------+---------------+---------+-----------+----------+--------------+ FV Mid   Full                                                        +---------+---------------+---------+-----------+----------+--------------+ FV DistalFull                                                        +---------+---------------+---------+-----------+----------+--------------+ PFV      Full                                                        +---------+---------------+---------+-----------+----------+--------------+  POP      Full           Yes      Yes                                 +---------+---------------+---------+-----------+----------+--------------+ PTV      Full                                                        +---------+---------------+---------+-----------+----------+--------------+ PERO     Full                                                        +---------+---------------+---------+-----------+----------+--------------+   +---------+---------------+---------+-----------+----------+--------------+ LEFT     CompressibilityPhasicitySpontaneityPropertiesThrombus Aging +---------+---------------+---------+-----------+----------+--------------+ CFV      Full           Yes      Yes                                 +---------+---------------+---------+-----------+----------+--------------+ SFJ      Full                                                        +---------+---------------+---------+-----------+----------+--------------+ FV Prox  Full                                                         +---------+---------------+---------+-----------+----------+--------------+ FV Mid   Full                                                        +---------+---------------+---------+-----------+----------+--------------+ FV DistalFull                                                        +---------+---------------+---------+-----------+----------+--------------+ PFV      Full                                                        +---------+---------------+---------+-----------+----------+--------------+ POP      Full           Yes      Yes                                 +---------+---------------+---------+-----------+----------+--------------+  PTV      Full                                                        +---------+---------------+---------+-----------+----------+--------------+ PERO     Full                                                        +---------+---------------+---------+-----------+----------+--------------+     Summary: BILATERAL: - No evidence of deep vein thrombosis seen in the lower extremities, bilaterally. -No evidence of popliteal cyst, bilaterally.   *See table(s) above for measurements and observations. Electronically signed by Monica Martinez MD on 12/30/2019 at 3:16:31 PM.    Final       Subjective: Patient seen and examined at the bedside this morning.  Hemodynamically stable for discharge today.  Discharge Exam: Vitals:   12/31/19 2213 01/01/20 0747  BP: 111/65 133/67  Pulse: 81 98  Resp:  (!) 21  Temp: 98.1 F (36.7 C) 97.9 F (36.6 C)  SpO2: 91% 92%   Vitals:   12/31/19 1630 12/31/19 2023 12/31/19 2213 01/01/20 0747  BP: 128/72  111/65 133/67  Pulse: 91 86 81 98  Resp: (!) 23 (!) 22  (!) 21  Temp: 98.2 F (36.8 C)  98.1 F (36.7 C) 97.9 F (36.6 C)  TempSrc: Oral  Oral Oral  SpO2: 95% 90% 91% 92%  Weight:      Height:        General: Pt is alert, awake, not in acute distress Cardiovascular: RRR,  S1/S2 +, no rubs, no gallops Respiratory: CTA bilaterally, no wheezing, no rhonchi Abdominal: Soft, NT, ND, bowel sounds + Extremities: no edema, no cyanosis    The results of significant diagnostics from this hospitalization (including imaging, microbiology, ancillary and laboratory) are listed below for reference.     Microbiology: Recent Results (from the past 240 hour(s))  SARS Coronavirus 2 by RT PCR (hospital order, performed in [facility] hospital lab) Nasopharyngeal Nasopharyngeal Swab     Status: None   Collection Time: 12/29/19  5:15 PM   Specimen: Nasopharyngeal Swab  Result Value Ref Range Status   SARS Coronavirus 2 NEGATIVE NEGATIVE Final    Comment: (NOTE) SARS-CoV-2 target nucleic acids are NOT DETECTED.  The SARS-CoV-2 RNA is generally detectable in upper and lower respiratory specimens during the acute phase of infection. The lowest concentration of SARS-CoV-2 viral copies this assay can detect is 250 copies / mL. A negative result does not preclude SARS-CoV-2 infection and should not be used as the sole basis for treatment or other patient management decisions.  A negative result may occur with improper specimen collection / handling, submission of specimen other than nasopharyngeal swab, presence of viral mutation(s) within the areas targeted by this assay, and inadequate number of viral copies (<250 copies / mL). A negative result must be combined with clinical observations, patient history, and epidemiological information.  Fact Sheet for Patients:   StrictlyIdeas.no  Fact Sheet for Healthcare Providers: BankingDealers.co.za  This test is not yet approved or  cleared by the [place] FDA and has been authorized for detection and/or diagnosis of SARS-CoV-2 by FDA under an  Emergency Use Authorization (EUA).  This EUA will remain in effect (meaning this test can be used) for the duration of the COVID-19  declaration under Section 564(b)(1) of the Act, 21 U.S.C. section 360bbb-3(b)(1), unless the authorization is terminated or revoked sooner.  Performed at [facility], [address] [place], [place] [address]      Labs: BNP (last 3 results) Recent Labs    12/29/19 1715  BNP 18.2   Basic Metabolic Panel: Recent Labs  Lab 12/29/19 1708 12/30/19 0234  NA 134* 134*  K 3.7 3.8  CL 100 100  CO2 21* 22  GLUCOSE 131* 134*  BUN 21* 17  CREATININE 1.25* 1.13  CALCIUM 8.6* 8.6*  MG  --  2.0  PHOS  --  3.9   Liver Function Tests: Recent Labs  Lab 12/29/19 1708 12/30/19 0234  AST 21 19  ALT 22 23  ALKPHOS 92 78  BILITOT 0.9 1.0  PROT 8.2* 7.3  ALBUMIN 3.9 3.4*   No results for input(s): LIPASE, AMYLASE in the last 168 hours. No results for input(s): AMMONIA in the last 168 hours. CBC: Recent Labs  Lab 12/29/19 1708 12/30/19 0234 12/31/19 0356 01/01/20 0241  WBC 17.7* 15.9* 15.6* 14.6*  NEUTROABS 12.2*  --  11.3*  --   HGB 16.3 15.7 15.0 13.6  HCT 46.4 46.3 44.3 40.5  MCV 85.6 87.4 87.5 87.5  PLT 321 293 267 217   Cardiac Enzymes: No results for input(s): CKTOTAL, CKMB, CKMBINDEX, TROPONINI in the last 168 hours. BNP: Invalid input(s): POCBNP CBG: No results for input(s): GLUCAP in the last 168 hours. D-Dimer No results for input(s): DDIMER in the last 72 hours. Hgb A1c No results for input(s): HGBA1C in the last 72 hours. Lipid Profile No results for input(s): CHOL, HDL, LDLCALC, TRIG, CHOLHDL, LDLDIRECT in the last 72 hours. Thyroid function studies No results for input(s): TSH, T4TOTAL, T3FREE, THYROIDAB in the last 72 hours.  Invalid input(s): FREET3 Anemia work up No results for input(s): VITAMINB12, FOLATE, FERRITIN, TIBC, IRON, RETICCTPCT in the last 72 hours. Urinalysis    Component Value Date/Time   COLORURINE YELLOW 11/24/2018 0852   APPEARANCEUR CLEAR 11/24/2018 0852   LABSPEC 1.017 11/24/2018 0852   PHURINE 6.0 11/24/2018  0852   GLUCOSEU NEGATIVE 11/24/2018 0852   HGBUR NEGATIVE 11/24/2018 0852   BILIRUBINUR NEGATIVE 11/24/2018 0852   KETONESUR NEGATIVE 11/24/2018 0852   PROTEINUR NEGATIVE 11/24/2018 0852   NITRITE NEGATIVE 11/24/2018 0852   LEUKOCYTESUR NEGATIVE 11/24/2018 0852   Sepsis Labs Invalid input(s): PROCALCITONIN,  WBC,  LACTICIDVEN Microbiology Recent Results (from the past 240 hour(s))  SARS Coronavirus 2 by RT PCR (hospital order, performed in [place] hospital lab) Nasopharyngeal Nasopharyngeal Swab     Status: None   Collection Time: 12/29/19  5:15 PM   Specimen: Nasopharyngeal Swab  Result Value Ref Range Status   SARS Coronavirus 2 NEGATIVE NEGATIVE Final    Comment: (NOTE) SARS-CoV-2 target nucleic acids are NOT DETECTED.  The SARS-CoV-2 RNA is generally detectable in upper and lower respiratory specimens during the acute phase of infection. The lowest concentration of SARS-CoV-2 viral copies this assay can detect is 250 copies / mL. A negative result does not preclude SARS-CoV-2 infection and should not be used as the sole basis for treatment or other patient management decisions.  A negative result may occur with improper specimen collection / handling, submission of specimen other than nasopharyngeal swab, presence of viral mutation(s) within the areas targeted  by this assay, and inadequate number of viral copies (<250 copies / mL). A negative result must be combined with clinical observations, patient history, and epidemiological information.  Fact Sheet for Patients:   StrictlyIdeas.no  Fact Sheet for Healthcare Providers: BankingDealers.co.za  This test is not yet approved or  cleared by the [place] FDA and has been authorized for detection and/or diagnosis of SARS-CoV-2 by FDA under an Emergency Use Authorization (EUA).  This EUA will remain in effect (meaning this test can be used) for the duration of  the COVID-19 declaration under Section 564(b)(1) of the Act, 21 U.S.C. section 360bbb-3(b)(1), unless the authorization is terminated or revoked sooner.  Performed at [facility], [address] [place], [place] [address]     Please note: You were cared for by a hospitalist during your hospital stay. Once you are discharged, your primary care physician will handle any further medical issues. Please note that NO REFILLS for any discharge medications will be authorized once you are discharged, as it is imperative that you return to your primary care physician (or establish a relationship with a primary care physician if you do not have one) for your post hospital discharge needs so that they can reassess your need for medications and monitor your lab values.    Time coordinating discharge: 40 minutes  SIGNED:   Shelly Coss, MD  [facility] 01/01/2020, 12:14 PM Pager [phone]  If 7PM-7AM, please contact night-coverage [email or web address] Password [email or web address]

## 2020-01-01 NOTE — Care Management (Addendum)
Entered benefit check for Eliquis 5 mg PO BID. TOC team will provide 30 day free card, and $10 co pay card.    Provided patient with pay no more than $10 co pay card. Prescription was sent to [facility] who will fill with 30 day free card. Patient aware and voiced understanding  Magdalen Spatz RN

## 2020-01-02 LAB — PROTEIN C, TOTAL: Protein C, Total: 98 % (ref 60–150)

## 2020-01-03 LAB — HOMOCYSTEINE: Homocysteine: 14 umol/L (ref 0.0–14.5)

## 2020-01-04 ENCOUNTER — Other Ambulatory Visit: Payer: Self-pay | Admitting: *Deleted

## 2020-01-04 ENCOUNTER — Encounter: Payer: Self-pay | Admitting: Family Medicine

## 2020-01-04 ENCOUNTER — Ambulatory Visit (INDEPENDENT_AMBULATORY_CARE_PROVIDER_SITE_OTHER): Payer: No Typology Code available for payment source

## 2020-01-04 ENCOUNTER — Other Ambulatory Visit: Payer: Self-pay

## 2020-01-04 ENCOUNTER — Ambulatory Visit (INDEPENDENT_AMBULATORY_CARE_PROVIDER_SITE_OTHER): Payer: No Typology Code available for payment source | Admitting: Family Medicine

## 2020-01-04 VITALS — BP 119/73 | HR 77 | Ht 69.0 in | Wt 265.0 lb

## 2020-01-04 DIAGNOSIS — I2699 Other pulmonary embolism without acute cor pulmonale: Secondary | ICD-10-CM

## 2020-01-04 DIAGNOSIS — R059 Cough, unspecified: Secondary | ICD-10-CM

## 2020-01-04 DIAGNOSIS — I10 Essential (primary) hypertension: Secondary | ICD-10-CM

## 2020-01-04 DIAGNOSIS — R05 Cough: Secondary | ICD-10-CM

## 2020-01-04 LAB — FACTOR 5 LEIDEN

## 2020-01-04 NOTE — Assessment & Plan Note (Signed)
BP at goal 

## 2020-01-04 NOTE — Assessment & Plan Note (Addendum)
Recommendation is a minimum of 6 months of treatment with Eliquis and likely lifelong.  I did do some additional labs to evaluate for potential risk factors for DVT.  So far everything has come back negative.  He does have a history of alcohol abuse and admits he had been much more sedentary over the last couple months than his typical.  Otherwise no other risk factors and no prior history of DVT.  He still on 10 mg Eliquis twice a day until Saturday and then will go down to 5 mg twice daily.

## 2020-01-04 NOTE — Patient Outreach (Signed)
[facility]) Care Management  01/04/2020  THURMAN SARVER 1961-02-21 [id number]   Transition of care telephone call  Referral received:01/02/20 Initial outreach:01/04/20 Insurance: [employer]  Initial unsuccessful telephone call to patient's preferred number in order to complete transition of care assessment; no answer, left HIPAA compliant voicemail message requesting return call.   Objective: Per the electronic medical record, Mr. Leverich  was hospitalized at [facility]  from 7/2-7/5   with/for  Bilateral Pulmonary embolus . Comorbidities include:Hypertension, depression , hyperlipidemia, right hip pain   He was discharged to home on 01/01/20 without the need for home health services or durable medical equipment per the discharge summary.   Plan: This RNCM will route unsuccessful outreach letter with [place] Management pamphlet and 24 hour Nurse Advice Line Magnet to [place] [facility] clinical pool to be mailed to patient's home address. This RNCM will attempt another outreach within 4 business days.  Joylene Draft, RN, BSN  [place] Management Coordinator  [phone]- Mobile [phone]- Toll Free Main Office

## 2020-01-04 NOTE — Progress Notes (Signed)
Established Patient Office Visit  Subjective:  Patient ID: Jackson Sherman, male    DOB: [birth year]  Age: 59 y.o. MRN: [id number]  CC:  Chief Complaint  Patient presents with  . Hospitalization Follow-up    HPI Jackson Sherman presents for hospital follow-up for bilateral pulmonary embolism.  He is a 22-year-old male with a history of hypertension hyperlipidemia with no prior history of any type of DVT or pulmonary embolism who started experiencing chest pain and shortness of breath for about 3 to 4 days he said it got so bad that he actually checked his pulse ox at home his wife had a pulse oximeter.  It was running around 79-80 and so they went to the emergency department.  Evaluation upon arrival showed leukocytosis and mild acute kidney injury.  CT angios showed bilateral pulmonary emboli.  He was started on a heparin drip and then changed to Eliquis for discharge.  They did start him on a low-dose of metoprolol he is taking half of a tablet 25 mg twice a day for tachycardia that he was experiencing during admission.  He feels like his heart rate has been a little bit better since coming home but will still jump up into the 120s with activity.  Even though he had a leukocytosis they felt like it was likely just reactive.  Since being home he has been feeling a little bit better in regards to getting his strength back he still feels really tired he still has some shortness of breath with activity.  His only concern is that he has developed a dry cough and feels like it has actually gotten a little bit worse.  He denies any sinus symptoms congestion, fever, chills, sweats etc.  CT angio revealed multiple right pulmonary emboli within the central pulmonary arteries and branches in all lobes of the right lung.  Small subsegmental left lower lobe pulmonary embolus no evidence of right heart strain.  Past Medical History:  Diagnosis Date  . 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 Anxiety    . Depression   . GERD (gastroesophageal reflux disease)   . Hemorrhoids   . Hip arthritis   . Hyperlipidemia   . Hypertension   . Joint pain   . Osteoarthritis   . Seizures ([place])    ?? in August ...more related to ETOH rehab    Past Surgical History:  Procedure Laterality Date  . ARTHROSCOPIC REPAIR ACL     left  . FRACTURE SURGERY     no surgery on broke thumb  . HEMORRHOID SURGERY N/A 11/23/2012   Procedure: HEMORRHOIDECTOMY;  Surgeon: Madilyn Hook, DO;  Location: [facility];  Service: General;  Laterality: N/A;  . JOINT REPLACEMENT Right 12/02/2018   right total hip arthroplasty  . TONSILLECTOMY     Age 14   . TOTAL HIP ARTHROPLASTY Right 12/02/2018   Procedure: TOTAL HIP ARTHROPLASTY ANTERIOR APPROACH;  Surgeon: Dorna Leitz, MD;  Location: [facility];  Service: Orthopedics;  Laterality: Right;  . TOTAL KNEE ARTHROPLASTY Left 06/15/2016   Procedure: LEFT TOTAL KNEE ARTHROPLASTY;  Surgeon: Vickey Huger, MD;  Location: [place];  Service: Orthopedics;  Laterality: Left;    Family History  Problem Relation Age of Onset  . Heart disease Father   . Heart disease Maternal Grandmother 56  . Stroke Maternal Grandmother   . Diabetes Maternal Grandmother   . Hyperlipidemia Mother   . Hypertension Mother   . Breast cancer Mother 100  . Depression Sister   .  Diabetes Sister   . Diabetes Brother   . Diabetes Paternal Grandmother   . Stroke Paternal Grandmother     Social History   Socioeconomic History  . Marital status: Married    Spouse name: Not on file  . Number of children: 4  . Years of education: Not on file  . Highest education level: Not on file  Occupational History  . Occupation: retired [employer], current Financial planner: [employer]  Tobacco Use  . Smoking status: Former Smoker    Packs/day: 1.00    Years: 15.00    Pack years: 15.00    Types: Cigarettes    Quit date: 06/30/2003    Years since quitting: 16.5  . Smokeless tobacco: Never Used   Vaping Use  . Vaping Use: Never used  Substance and Sexual Activity  . Alcohol use: Not Currently  . Drug use: No  . 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 Not on file  Social History Narrative   No regular exercise.  3-4 cups per day of caffeine.     11/30/18- attends [facility] and has lost 33 lbs since starting with them   Social Determinants of Health   Financial Resource Strain:   . Difficulty of Paying Living Expenses:   Food Insecurity:   . Worried About Charity fundraiser in the Last Year:   . Arboriculturist in the Last Year:   Transportation Needs:   . Film/video editor (Medical):   Marland Kitchen Lack of Transportation (Non-Medical):   Physical Activity:   . Days of Exercise per Week:   . Minutes of Exercise per Session:   Stress:   . Feeling of Stress :   Social Connections:   . Frequency of Communication with Friends and Family:   . Frequency of Social Gatherings with Friends and Family:   . Attends Religious Services:   . Active Member of Clubs or Organizations:   . Attends Archivist Meetings:   Marland Kitchen Marital Status:   Intimate Partner Violence:   . Fear of Current or Ex-Partner:   . Emotionally Abused:   Marland Kitchen Physically Abused:   . Sexually Abused:     Outpatient Medications Prior to Visit  Medication Sig Dispense Refill  . [START ON 01/06/2020] apixaban (ELIQUIS) 5 MG TABS tablet Take 1 tablet (5 mg total) by mouth 2 (two) times daily. 60 tablet 1  . atorvastatin (LIPITOR) 20 MG tablet Take 20 mg by mouth at bedtime 90 tablet 3  . busPIRone (BUSPAR) 15 MG tablet TAKE 1 TABLET (15 MG TOTAL) BY MOUTH 2 (TWO) TIMES DAILY. 180 tablet 1  . diphenhydrAMINE (BENADRYL) 25 MG tablet Take 25 mg by mouth at bedtime as needed for sleep.     Marland Kitchen escitalopram (LEXAPRO) 20 MG tablet Take 1 tablet (20  mg total) by mouth daily. 90 tablet 1  . indomethacin (INDOCIN) 50 MG capsule Take 1 capsule (50 mg total) by mouth 3 (three) times daily with meals. 30 capsule 1  . loratadine (CLARITIN) 10 MG tablet Take 10 mg by mouth daily.    Marland Kitchen losartan (COZAAR) 50 MG tablet Take 1 tablet (50 mg total) by mouth daily. 90 tablet 1  . metoprolol tartrate (LOPRESSOR) 25 MG tablet Take 0.5 tablets (  12.5 mg total) by mouth 2 (two) times daily. 60 tablet 0  . naltrexone (DEPADE) 50 MG tablet TAKE 1 TABLET (50 MG TOTAL) BY MOUTH DAILY. 90 tablet 1  . REXULTI 2 MG TABS tablet Take 1 tablet (2 mg total) by mouth daily. 180 tablet 1  . sildenafil (REVATIO) 20 MG tablet Take 2-5 tablets (40-100 mg total) by mouth daily as needed. 50 tablet 4  . traZODone (DESYREL) 100 MG tablet TAKE 1 TABLET (100 MG TOTAL) BY MOUTH AT BEDTIME. 90 tablet 1  . apixaban (ELIQUIS) 5 MG TABS tablet Take 2 tablets (10 mg total) by mouth 2 (two) times daily for 5 days. Start from today evening 22 tablet 0   No facility-administered medications prior to visit.    No Known Allergies  ROS Review of Systems    Objective:    Physical Exam  BP 119/73   Pulse 77   Ht 5\' 9"  (1.753 m)   Wt 265 lb (120.2 kg)   SpO2 98%   BMI 39.13 kg/m  Wt Readings from Last 3 Encounters:  01/04/20 265 lb (120.2 kg)  12/29/19 265 lb (120.2 kg)  12/12/19 270 lb 3.2 oz (122.6 kg)     There are no preventive care reminders to display for this patient.  There are no preventive care reminders to display for this patient.  Lab Results  Component Value Date   TSH 1.042 01/01/2020   Lab Results  Component Value Date   WBC 14.6 (H) 01/01/2020   HGB 13.6 01/01/2020   HCT 40.5 01/01/2020   MCV 87.5 01/01/2020   PLT 217 01/01/2020   Lab Results  Component Value Date   NA 134 (L) 12/30/2019   K 3.8 12/30/2019   CO2 22 12/30/2019   GLUCOSE 134 (H) 12/30/2019   BUN 17 12/30/2019   CREATININE 1.13 12/30/2019   BILITOT 1.0 12/30/2019   ALKPHOS  78 12/30/2019   AST 19 12/30/2019   ALT 23 12/30/2019   PROT 7.3 12/30/2019   ALBUMIN 3.4 (L) 12/30/2019   CALCIUM 8.6 (L) 12/30/2019   ANIONGAP 12 12/30/2019   Lab Results  Component Value Date   CHOL 168 11/14/2019   Lab Results  Component Value Date   HDL 50 11/14/2019   Lab Results  Component Value Date   LDLCALC 97 11/14/2019   Lab Results  Component Value Date   TRIG 113 11/14/2019   Lab Results  Component Value Date   CHOLHDL 3.4 11/14/2019   Lab Results  Component Value Date   HGBA1C 5.4 11/14/2019      Assessment & Plan:   Problem List Items Addressed This Visit      Cardiovascular and Mediastinum   Pulmonary embolism ([place]) - bilateral     Recommendation is a minimum of 6 months of treatment with Eliquis and likely lifelong.  I did do some additional labs to evaluate for potential risk factors for DVT.  So far everything has come back negative.  He does have a history of alcohol abuse and admits he had been much more sedentary over the last couple months than his typical.  Otherwise no other risk factors and no prior history of DVT.  He still on 10 mg Eliquis twice a day until Saturday and then will go down to 5 mg twice daily.      Essential hypertension    BP at goal.        Other Visit Diagnoses    Bilateral pulmonary embolism (  [place])    -  Primary   Relevant Orders   DG Chest 2 View   CBC with Differential/Platelet   Cough       Relevant Orders   DG Chest 2 View   CBC with Differential/Platelet     Cough-Suspect related directly to the bilateral pulmonary emboli.  But has gotten worse since being home so recommend chest x-ray today just to rule out any sign of infiltration especially since he did have an elevated white blood cell count in the hospital.  We will recheck CBC today as well.  No orders of the defined types were placed in this encounter.   Follow-up: Return in about 2 months (around 03/06/2020) for recheck lungs .   Time spent 35  minutes in encounter and including reviewing notes and hospital discharge.   Beatrice Lecher, MD

## 2020-01-05 LAB — CBC WITH DIFFERENTIAL/PLATELET
Absolute Monocytes: 1168 cells/uL — ABNORMAL HIGH (ref 200–950)
Basophils Absolute: 114 cells/uL (ref 0–200)
Basophils Relative: 0.9 %
Eosinophils Absolute: 432 cells/uL (ref 15–500)
Eosinophils Relative: 3.4 %
HCT: 41.9 % (ref 38.5–50.0)
Hemoglobin: 14 g/dL (ref 13.2–17.1)
Lymphs Abs: 3632 cells/uL (ref 850–3900)
MCH: 28.9 pg (ref 27.0–33.0)
MCHC: 33.4 g/dL (ref 32.0–36.0)
MCV: 86.6 fL (ref 80.0–100.0)
MPV: 11.6 fL (ref 7.5–12.5)
Monocytes Relative: 9.2 %
Neutro Abs: 7353 cells/uL (ref 1500–7800)
Neutrophils Relative %: 57.9 %
Platelets: 390 10*3/uL (ref 140–400)
RBC: 4.84 10*6/uL (ref 4.20–5.80)
RDW: 12.2 % (ref 11.0–15.0)
Total Lymphocyte: 28.6 %
WBC: 12.7 10*3/uL — ABNORMAL HIGH (ref 3.8–10.8)

## 2020-01-05 LAB — PROTHROMBIN GENE MUTATION

## 2020-01-09 ENCOUNTER — Ambulatory Visit: Payer: No Typology Code available for payment source | Admitting: Family Medicine

## 2020-01-09 ENCOUNTER — Other Ambulatory Visit: Payer: Self-pay | Admitting: *Deleted

## 2020-01-09 NOTE — Patient Outreach (Signed)
[facility]) Care Management  01/09/2020  Jackson Sherman 1960/10/06 [id number]   Transition of care call Referral received: 01/02/20 Initial outreach attempt: 01/04/20 Insurance: [employer]    2nd unsuccessful telephone call to patient's preferred contact number in order to complete post hospital discharge transition of care assessment , no answer left HIPAA compliant message requesting return call.    Objective: Per the electronic medical record, Jackson Sherman  was hospitalized at [facility]  from 7/2-01/01/20   with/for  Bilateral Pulmonary embolus . Comorbidities include:Hypertension, depression , hyperlipidemia, right hip pain   He was discharged to home on 01/01/20 without the need for home health services or durable medical equipment per the discharge summary.   Plan If no return call from patient will attempt 3rd outreach in the next 4 business days.    Joylene Draft, RN, BSN  [place] Management Coordinator  [phone]- Mobile [phone]- Toll Free Main Office

## 2020-01-12 ENCOUNTER — Other Ambulatory Visit: Payer: Self-pay | Admitting: *Deleted

## 2020-01-12 NOTE — Patient Outreach (Signed)
[facility]) Care Management  01/12/2020  Jackson Sherman Sep 13, 1960 [id number]   Transition of care call Referral received: 01/02/20 Initial outreach attempt: 01/04/20 Insurance: [employer] plan   Third unsuccessful telephone call to patient's preferred contact number in order to complete post hospital discharge transition of care assessment; no answer, left HIPAA compliant message requesting return call.   Objective: Per the electronic medical record, Jackson Sherman hospitalized at [facility] from7/2-7/5/21with/for Bilateral Pulmonary embolus. Comorbidities include:Hypertension, depression , hyperlipidemia, right hip painHe was discharged to home on 7/5/21without the need for home health services or durable medical equipment per the discharge summary.    Plan: If no return call from patient, will close case to [facility] Management services in 10 business days after initial post hospital discharge outreach, on 01/04/20.    Joylene Draft, RN, BSN  [place] Management Coordinator  [phone]- Mobile [phone]- Toll Free Main Office

## 2020-01-17 ENCOUNTER — Other Ambulatory Visit: Payer: Self-pay | Admitting: *Deleted

## 2020-01-17 NOTE — Patient Outreach (Signed)
[facility]) Care Management  01/17/2020  Jackson Sherman [birth year] [id number]   Transition of care /Case Closure Unsuccessful outreach    Referral received:01/02/20 Initial outreach:01/04/20 Insurance: [place]   Unable to complete post hospital discharge transition of care assessment. No return call form patient after 3 call attempts and no response to request to contact RN Care Coordinator in unsuccessful outreach letter mailed to home on 01/04/20.  Objective: Per the electronic medical record, Jackson Sherman hospitalized at [facility] from7/2-7/5/21with/for Bilateral Pulmonary embolus. Comorbidities include:Hypertension, depression , hyperlipidemia, right hip painHe was discharged to home on 7/5/21without the need for home health services or durable medical equipment per the discharge summary Plan Case closed to [name] care management services as it has been 10 days since initial post discharge outreach attempt.     Joylene Draft, RN, BSN  [place] Management Coordinator  [phone]- Mobile [phone]- Toll Free Main Office

## 2020-01-23 ENCOUNTER — Other Ambulatory Visit: Payer: Self-pay | Admitting: Family Medicine

## 2020-01-23 DIAGNOSIS — E785 Hyperlipidemia, unspecified: Secondary | ICD-10-CM

## 2020-01-23 MED FILL — ELIQUIS 5 MG TABLET: 5 | 30 days supply | Qty: 60 | Fill #0

## 2020-01-23 MED FILL — busPIRone HCL 15 MG TABS: 15 | 90 days supply | Qty: 180 | Fill #1

## 2020-01-23 MED FILL — ATORVASTATIN 20 MG TABLET: 20 | 90 days supply | Qty: 90 | Fill #0

## 2020-01-23 MED FILL — REXULTI 2 MG TABLET: 2 | 90 days supply | Qty: 90 | Fill #3

## 2020-01-23 MED FILL — traZODone HCL 100 MG TABS: 100 | 90 days supply | Qty: 90 | Fill #1

## 2020-01-26 ENCOUNTER — Other Ambulatory Visit: Payer: Self-pay

## 2020-01-26 DIAGNOSIS — F32A Depression, unspecified: Secondary | ICD-10-CM

## 2020-01-26 MED ORDER — ESCITALOPRAM OXALATE 20 MG PO TABS: 20.0000 mg | ORAL_TABLET | Freq: Every day | ORAL | 0 refills | Status: DC

## 2020-01-26 MED FILL — ESCITALOPRAM 20 MG TABLET: 20 | 90 days supply | Qty: 90 | Fill #0

## 2020-02-20 MED FILL — LOSARTAN POTASSIUM 50 MG TA: 50 | 90 days supply | Qty: 90 | Fill #1

## 2020-03-05 MED FILL — ELIQUIS 5 MG TABLET: 5 | 30 days supply | Qty: 60 | Fill #1

## 2020-03-06 ENCOUNTER — Other Ambulatory Visit: Payer: Self-pay | Admitting: Family Medicine

## 2020-03-06 ENCOUNTER — Encounter: Payer: Self-pay | Admitting: Family Medicine

## 2020-03-06 ENCOUNTER — Ambulatory Visit (INDEPENDENT_AMBULATORY_CARE_PROVIDER_SITE_OTHER): Payer: No Typology Code available for payment source | Admitting: Family Medicine

## 2020-03-06 VITALS — BP 121/75 | HR 73 | Ht 69.0 in | Wt 273.0 lb

## 2020-03-06 DIAGNOSIS — F329 Major depressive disorder, single episode, unspecified: Secondary | ICD-10-CM

## 2020-03-06 DIAGNOSIS — F32A Depression, unspecified: Secondary | ICD-10-CM

## 2020-03-06 DIAGNOSIS — G4700 Insomnia, unspecified: Secondary | ICD-10-CM

## 2020-03-06 DIAGNOSIS — I10 Essential (primary) hypertension: Secondary | ICD-10-CM

## 2020-03-06 DIAGNOSIS — I2699 Other pulmonary embolism without acute cor pulmonale: Secondary | ICD-10-CM | POA: Diagnosis not present

## 2020-03-06 DIAGNOSIS — F322 Major depressive disorder, single episode, severe without psychotic features: Secondary | ICD-10-CM

## 2020-03-06 DIAGNOSIS — F1021 Alcohol dependence, in remission: Secondary | ICD-10-CM

## 2020-03-06 MED ORDER — APIXABAN 5 MG PO TABS: 5.0000 mg | ORAL_TABLET | Freq: Two times a day (BID) | ORAL | 0 refills | Status: DC

## 2020-03-06 MED ORDER — REXULTI 2 MG PO TABS: 2.0000 mg | ORAL_TABLET | Freq: Every day | ORAL | 1 refills | Status: DC

## 2020-03-06 MED ORDER — INDOMETHACIN 50 MG PO CAPS
50.0000 mg | ORAL_CAPSULE | Freq: Three times a day (TID) | ORAL | 1 refills | Status: DC | PRN
Start: 2020-03-06 — End: 2021-06-04

## 2020-03-06 MED ORDER — TRAZODONE HCL 100 MG PO TABS: 100.0000 mg | ORAL_TABLET | Freq: Every day | ORAL | 1 refills | Status: DC

## 2020-03-06 NOTE — Assessment & Plan Note (Signed)
Well controlled. Continue current regimen. Follow up in  6 mo  

## 2020-03-06 NOTE — Assessment & Plan Note (Signed)
Doing really well.  He feels like he is completely back to baseline no residual chest pain or shortness of breath.  He is taking his medications regularly but he does need refills.

## 2020-03-06 NOTE — Assessment & Plan Note (Addendum)
Is like he is stable on the Rexulti, BuSpar, and trazodone for sleep, and Lexapro.  Refill sent to pharmacy.  I do feel like he would benefit from seeing psychiatry more long-term.

## 2020-03-06 NOTE — Progress Notes (Signed)
Established Patient Office Visit  Subjective:  Patient ID: Jackson Sherman, male    DOB: May 22, 1961  Age: 59 y.o. MRN: [id number]  CC:  Chief Complaint  Patient presents with  . Follow-up    HPI Jackson Sherman presents for follow-up of bilateral pulmonary embolisms he was hospitalized July 2.  This is a 85-month checkup.  The recommendation is for a minimum of 6 months of treatment with Eliquis and possibly even lifelong therapy.  He actually reports that he is doing well.  He is not having any residual symptoms whatsoever he reports he is taking his medications regularly he has he says that he is still abstinent has not been drinking but he is also not been engaged in any type of rehab or maintenance program for alcoholism.  Is requesting refills on some of his psychiatric medications he has not established with a psychiatrist.    Past Medical History:  Diagnosis Date  . 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 Anxiety   . Depression   . GERD (gastroesophageal reflux disease)   . Hemorrhoids   . Hip arthritis   . Hyperlipidemia   . Hypertension   . Joint pain   . Osteoarthritis   . Seizures ([place])    ?? in August ...more related to ETOH rehab    Past Surgical History:  Procedure Laterality Date  . ARTHROSCOPIC REPAIR ACL     left  . FRACTURE SURGERY     no surgery on broke thumb  . HEMORRHOID SURGERY N/A 11/23/2012   Procedure: HEMORRHOIDECTOMY;  Surgeon: Madilyn Hook, DO;  [facility]: [facility];  Service: General;  Laterality: N/A;  . JOINT REPLACEMENT Right 12/02/2018   right total hip arthroplasty  . TONSILLECTOMY     Age 1   . TOTAL HIP ARTHROPLASTY Right 12/02/2018   Procedure: TOTAL HIP ARTHROPLASTY ANTERIOR APPROACH;  Surgeon: Dorna Leitz, MD;  [facility];  Service: Orthopedics;  Laterality: Right;  . TOTAL KNEE ARTHROPLASTY Left 06/15/2016   Procedure: LEFT TOTAL KNEE ARTHROPLASTY;  Surgeon: Vickey Huger, MD;  Location: [facility];  Service: Orthopedics;   Laterality: Left;    Family History  Problem Relation Age of Onset  . Heart disease Father   . Heart disease Maternal Grandmother 45  . Stroke Maternal Grandmother   . Diabetes Maternal Grandmother   . Hyperlipidemia Mother   . Hypertension Mother   . Breast cancer Mother 75  . Depression Sister   . Diabetes Sister   . Diabetes Brother   . Diabetes Paternal Grandmother   . Stroke Paternal Grandmother     Social History   Socioeconomic History  . Marital status: Married    Spouse name: Not on file  . Number of children: 4  . Years of education: Not on file  . Highest education level: Not on file  Occupational History  . Occupation: retired [employer], current Financial planner: [employer]  Tobacco Use  . Smoking status: Former Smoker    Packs/day: 1.00    Years: 15.00    Pack years: 15.00    Types: Cigarettes    Quit date: 06/30/2003    Years since quitting: 16.6  . Smokeless tobacco: Never Used  Vaping Use  . Vaping Use: Never used  Substance and Sexual Activity  . Alcohol use: Not Currently  . Drug use: No  . 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 Not on file  Social History Narrative   No regular exercise.  3-4 cups per day of caffeine.     11/30/18- attends [facility] and has lost 33 lbs since starting with them   Social Determinants of Health   Financial Resource Strain:   . Difficulty of Paying Living Expenses: Not on file  Food Insecurity:   . Worried About Charity fundraiser in the Last Year: Not on file  . Ran Out of Food in the Last Year: Not on file  Transportation Needs:   . Lack of Transportation (Medical): Not on file  . Lack of Transportation (Non-Medical): Not on file  Physical Activity:   . Days of Exercise per Week: Not on file  . Minutes of  Exercise per Session: Not on file  Stress:   . Feeling of Stress : Not on file  Social Connections:   . Frequency of Communication with Friends and Family: Not on file  . Frequency of Social Gatherings with Friends and Family: Not on file  . Attends Religious Services: Not on file  . Active Member of Clubs or Organizations: Not on file  . Attends Archivist Meetings: Not on file  . Marital Status: Not on file  Intimate Partner Violence:   . Fear of Current or Ex-Partner: Not on file  . Emotionally Abused: Not on file  . Physically Abused: Not on file  . Sexually Abused: Not on file    Outpatient Medications Prior to Visit  Medication Sig Dispense Refill  . atorvastatin (LIPITOR) 20 MG tablet TAKE 1 TABLET BY MOUTH AT BEDTIME 90 tablet 3  . busPIRone (BUSPAR) 15 MG tablet TAKE 1 TABLET (15 MG TOTAL) BY MOUTH 2 (TWO) TIMES DAILY. 180 tablet 1  . diphenhydrAMINE (BENADRYL) 25 MG tablet Take 25 mg by mouth at bedtime as needed for sleep.     Marland Kitchen escitalopram (LEXAPRO) 20 MG tablet Take 1 tablet (20 mg total) by mouth daily. 90 tablet 0  . loratadine (CLARITIN) 10 MG tablet Take 10 mg by mouth daily.    Marland Kitchen losartan (COZAAR) 50 MG tablet Take 1 tablet (50 mg total) by mouth daily. 90 tablet 1  . metoprolol tartrate (LOPRESSOR) 25 MG tablet Take 0.5 tablets (12.5 mg total) by mouth 2 (two) times daily. 60 tablet 0  . apixaban (ELIQUIS) 5 MG TABS tablet Take 1 tablet (5 mg total) by mouth 2 (two) times daily. 60 tablet 1  . indomethacin (INDOCIN) 50 MG capsule Take 1 capsule (50 mg total) by mouth 3 (three) times daily with meals. 30 capsule 1  . naltrexone (DEPADE) 50 MG tablet TAKE 1 TABLET (50 MG TOTAL) BY MOUTH DAILY. 90 tablet 1  . REXULTI 2 MG TABS tablet Take 1 tablet (2 mg total) by mouth daily. 180 tablet 1  . traZODone (DESYREL) 100 MG tablet TAKE 1 TABLET (100 MG TOTAL) BY MOUTH AT BEDTIME. 90 tablet 1  . sildenafil (REVATIO) 20 MG tablet Take 2-5 tablets (40-100 mg total) by  mouth daily as needed. (Patient not taking: Reported on 03/06/2020) 50 tablet 4   No facility-administered medications prior to visit.    No Known Allergies  ROS Review of Systems    Objective:    Physical Exam Constitutional:      Appearance: He is well-developed.  HENT:     Head: Normocephalic  and atraumatic.  Cardiovascular:     Rate and Rhythm: Normal rate and regular rhythm.     Heart sounds: Normal heart sounds.  Pulmonary:     Effort: Pulmonary effort is normal.     Breath sounds: Normal breath sounds.  Skin:    General: Skin is warm and dry.  Neurological:     Mental Status: He is alert and oriented to person, place, and time.  Psychiatric:        Behavior: Behavior normal.     BP 121/75   Pulse 73   Ht 5\' 9"  (1.753 m)   Wt 273 lb (123.8 kg)   SpO2 97%   BMI 40.32 kg/m  Wt Readings from Last 3 Encounters:  03/06/20 273 lb (123.8 kg)  01/04/20 265 lb (120.2 kg)  12/29/19 265 lb (120.2 kg)     There are no preventive care reminders to display for this patient.  There are no preventive care reminders to display for this patient.  Lab Results  Component Value Date   TSH 1.042 01/01/2020   Lab Results  Component Value Date   WBC 12.7 (H) 01/04/2020   HGB 14.0 01/04/2020   HCT 41.9 01/04/2020   MCV 86.6 01/04/2020   PLT 390 01/04/2020   Lab Results  Component Value Date   NA 134 (L) 12/30/2019   K 3.8 12/30/2019   CO2 22 12/30/2019   GLUCOSE 134 (H) 12/30/2019   BUN 17 12/30/2019   CREATININE 1.13 12/30/2019   BILITOT 1.0 12/30/2019   ALKPHOS 78 12/30/2019   AST 19 12/30/2019   ALT 23 12/30/2019   PROT 7.3 12/30/2019   ALBUMIN 3.4 (L) 12/30/2019   CALCIUM 8.6 (L) 12/30/2019   ANIONGAP 12 12/30/2019   Lab Results  Component Value Date   CHOL 168 11/14/2019   Lab Results  Component Value Date   HDL 50 11/14/2019   Lab Results  Component Value Date   LDLCALC 97 11/14/2019   Lab Results  Component Value Date   TRIG 113  11/14/2019   Lab Results  Component Value Date   CHOLHDL 3.4 11/14/2019   Lab Results  Component Value Date   HGBA1C 5.4 11/14/2019      Assessment & Plan:   Problem List Items Addressed This Visit      Cardiovascular and Mediastinum   Pulmonary embolism ([place]) - bilateral     Doing really well.  He feels like he is completely back to baseline no residual chest pain or shortness of breath.  He is taking his medications regularly but he does need refills.      Relevant Medications   apixaban (ELIQUIS) 5 MG TABS tablet   Essential hypertension    Well controlled. Continue current regimen. Follow up in  6 mo      Relevant Medications   apixaban (ELIQUIS) 5 MG TABS tablet     Other   Insomnia   Relevant Medications   traZODone (DESYREL) 100 MG tablet   Current severe episode of major depressive disorder without psychotic features without prior episode ([place])    Is like he is stable on the Rexulti, BuSpar, and trazodone for sleep, and Lexapro.  Refill sent to pharmacy.  I do feel like he would benefit from seeing psychiatry more long-term.      Relevant Medications   traZODone (DESYREL) 100 MG tablet   REXULTI 2 MG TABS tablet   Alcohol dependence ([place])    Reports he is doing well and  has been abstinent he is not currently enrolled in any type of program or AA.  Did encourage him to reach out and try to get back into a program especially if at any point he feels like he is starting to struggle would rather him be proactive rather than reactive.  He is no longer on the naltrexone.       Other Visit Diagnoses    Bilateral pulmonary embolism (H[facility])    -  Primary   Relevant Medications   apixaban (ELIQUIS) 5 MG TABS tablet   Depression, unspecified depression type       Relevant Medications   traZODone (DESYREL) 100 MG tablet   REXULTI 2 MG TABS tablet      Meds ordered this encounter  Medications  . indomethacin (INDOCIN) 50 MG capsule    Sig: Take 1 capsule (50 mg  total) by mouth 3 (three) times daily as needed.    Dispense:  30 capsule    Refill:  1  . traZODone (DESYREL) 100 MG tablet    Sig: Take 1 tablet (100 mg total) by mouth at bedtime.    Dispense:  90 tablet    Refill:  1  . apixaban (ELIQUIS) 5 MG TABS tablet    Sig: Take 1 tablet (5 mg total) by mouth 2 (two) times daily.    Dispense:  180 tablet    Refill:  0  . REXULTI 2 MG TABS tablet    Sig: Take 1 tablet (2 mg total) by mouth daily.    Dispense:  180 tablet    Refill:  1    Follow-up: Return in about 3 months (around 06/05/2020) for PE.    Beatrice Lecher, MD

## 2020-03-06 NOTE — Assessment & Plan Note (Signed)
Reports he is doing well and has been abstinent he is not currently enrolled in any type of program or AA.  Did encourage him to reach out and try to get back into a program especially if at any point he feels like he is starting to struggle would rather him be proactive rather than reactive.  He is no longer on the naltrexone.

## 2020-03-08 ENCOUNTER — Other Ambulatory Visit: Payer: Self-pay | Admitting: *Deleted

## 2020-03-08 MED ORDER — METOPROLOL TARTRATE 25 MG PO TABS: 12.5000 mg | ORAL_TABLET | Freq: Two times a day (BID) | ORAL | 6 refills | Status: DC

## 2020-03-08 MED FILL — METOPROLOL TARTRATE 25 MG T: 25 | 60 days supply | Qty: 60 | Fill #0

## 2020-04-01 MED FILL — ELIQUIS 5 MG TABLET: 5 | 90 days supply | Qty: 180 | Fill #0

## 2020-04-29 ENCOUNTER — Other Ambulatory Visit: Payer: Self-pay | Admitting: Family Medicine

## 2020-04-29 DIAGNOSIS — F32A Depression, unspecified: Secondary | ICD-10-CM

## 2020-04-29 MED FILL — REXULTI 2 MG TABLET: 2 | 90 days supply | Qty: 90 | Fill #0

## 2020-04-29 MED FILL — traZODone HCL 100 MG TABS: 100 | 90 days supply | Qty: 90 | Fill #0

## 2020-04-29 MED FILL — ATORVASTATIN CALCIUM 20 MG: 20 | 90 days supply | Qty: 90 | Fill #1

## 2020-04-29 MED FILL — METOPROLOL TARTRATE 25 MG T: 25 | 60 days supply | Qty: 60 | Fill #1

## 2020-04-29 MED FILL — ESCITALOPRAM 20 MG TABLET: 20 | 90 days supply | Qty: 90 | Fill #0

## 2020-05-02 MED FILL — LOSARTAN POTASSIUM 50 MG TA: 50 | 90 days supply | Qty: 90 | Fill #0

## 2020-05-03 ENCOUNTER — Other Ambulatory Visit: Payer: Self-pay | Admitting: Family Medicine

## 2020-05-03 DIAGNOSIS — F32A Depression, unspecified: Secondary | ICD-10-CM

## 2020-05-03 MED FILL — busPIRone HCL 15 MG TABS: 15 | 90 days supply | Qty: 180 | Fill #0

## 2020-06-05 ENCOUNTER — Ambulatory Visit (INDEPENDENT_AMBULATORY_CARE_PROVIDER_SITE_OTHER): Payer: No Typology Code available for payment source | Admitting: Family Medicine

## 2020-06-05 ENCOUNTER — Encounter: Payer: Self-pay | Admitting: Family Medicine

## 2020-06-05 ENCOUNTER — Telehealth: Payer: Self-pay | Admitting: Family Medicine

## 2020-06-05 VITALS — BP 118/75 | HR 63 | Ht 69.0 in | Wt 274.0 lb

## 2020-06-05 DIAGNOSIS — Z125 Encounter for screening for malignant neoplasm of prostate: Secondary | ICD-10-CM | POA: Diagnosis not present

## 2020-06-05 DIAGNOSIS — I10 Essential (primary) hypertension: Secondary | ICD-10-CM | POA: Diagnosis not present

## 2020-06-05 DIAGNOSIS — R569 Unspecified convulsions: Secondary | ICD-10-CM

## 2020-06-05 DIAGNOSIS — F1021 Alcohol dependence, in remission: Secondary | ICD-10-CM | POA: Diagnosis not present

## 2020-06-05 DIAGNOSIS — I2699 Other pulmonary embolism without acute cor pulmonale: Secondary | ICD-10-CM | POA: Diagnosis not present

## 2020-06-05 DIAGNOSIS — R400 Somnolence: Secondary | ICD-10-CM | POA: Insufficient documentation

## 2020-06-05 DIAGNOSIS — F322 Major depressive disorder, single episode, severe without psychotic features: Secondary | ICD-10-CM

## 2020-06-05 DIAGNOSIS — Z6837 Body mass index (BMI) 37.0-37.9, adult: Secondary | ICD-10-CM

## 2020-06-05 NOTE — Progress Notes (Signed)
Established Patient Office Visit  Subjective:  Patient ID: Jackson Sherman, male    DOB: [birth year]  Age: 59 y.o. MRN: [id number]  CC:  Chief Complaint  Patient presents with  . Follow-up    HPI Jackson Sherman presents for follow-up pulmonary embolism.  He still doing really well he denies any pulmonary symptoms.  He still taking his Eliquis regularly.  Hypertension- Pt denies chest pain, SOB, dizziness, or heart palpitations.  Taking meds as directed w/o problems.  Denies medication side effects.    Follow-up depression-he feels stable on his current medication regimen he does not feel like he is had any exacerbations he says the holidays are normal a good time a year for him.  He says work is okay its not overwhelming or stressful.  History of alcohol dependence-he says he is still abstinent.  He is also not currently enrolled in AA or any type of program.  Is no longer on naltrexone.  He is working full time.    Past Medical History:  Diagnosis Date  . 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 Anxiety   . Depression   . GERD (gastroesophageal reflux disease)   . Hemorrhoids   . Hip arthritis   . Hyperlipidemia   . Hypertension   . Joint pain   . Osteoarthritis   . Seizures ([place])    ?? in August ...more related to ETOH rehab    Past Surgical History:  Procedure Laterality Date  . ARTHROSCOPIC REPAIR ACL     left  . FRACTURE SURGERY     no surgery on broke thumb  . HEMORRHOID SURGERY N/A 11/23/2012   Procedure: HEMORRHOIDECTOMY;  Surgeon: Madilyn Hook, DO;  [facility];  Service: General;  Laterality: N/A;  . JOINT REPLACEMENT Right 12/02/2018   right total hip arthroplasty  . TONSILLECTOMY     Age 81   . TOTAL HIP ARTHROPLASTY Right 12/02/2018   Procedure: TOTAL HIP ARTHROPLASTY ANTERIOR APPROACH;  Surgeon: Dorna Leitz, MD;  Location[facility];  Service: Orthopedics;  Laterality: Right;  . TOTAL KNEE ARTHROPLASTY Left 06/15/2016   Procedure: LEFT TOTAL  KNEE ARTHROPLASTY;  Surgeon: Vickey Huger, MD;  Location: [facility];  Service: Orthopedics;  Laterality: Left;    Family History  Problem Relation Age of Onset  . Heart disease Father   . Heart disease Maternal Grandmother 50  . Stroke Maternal Grandmother   . Diabetes Maternal Grandmother   . Hyperlipidemia Mother   . Hypertension Mother   . Breast cancer Mother 72  . Depression Sister   . Diabetes Sister   . Diabetes Brother   . Diabetes Paternal Grandmother   . Stroke Paternal Grandmother     Social History   Socioeconomic History  . Marital status: Married    Spouse name: Not on file  . Number of children: 4  . Years of education: Not on file  . Highest education level: Not on file  Occupational History  . Occupation: retired [employer], current Financial planner: [employer]  Tobacco Use  . Smoking status: Former Smoker    Packs/day: 1.00    Years: 15.00    Pack years: 15.00    Types: Cigarettes    Quit date: 06/30/2003    Years since quitting: 16.9  . Smokeless tobacco: Never Used  Vaping Use  . Vaping Use: Never used  Substance and Sexual Activity  . Alcohol use: Not Currently  . Drug use: No  .  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 Not on file  Social History Narrative   No regular exercise.  3-4 cups per day of caffeine.     11/30/18- attends [facility] and has lost 33 lbs since starting with them   Social Determinants of Health   Financial Resource Strain:   . Difficulty of Paying Living Expenses: Not on file  Food Insecurity:   . Worried About Charity fundraiser in the Last Year: Not on file  . Ran Out of Food in the Last Year: Not on file  Transportation Needs:   . Lack of Transportation (Medical): Not on file  . Lack of Transportation (Non-Medical): Not on file   Physical Activity:   . Days of Exercise per Week: Not on file  . Minutes of Exercise per Session: Not on file  Stress:   . Feeling of Stress : Not on file  Social Connections:   . Frequency of Communication with Friends and Family: Not on file  . Frequency of Social Gatherings with Friends and Family: Not on file  . Attends Religious Services: Not on file  . Active Member of Clubs or Organizations: Not on file  . Attends Archivist Meetings: Not on file  . Marital Status: Not on file  Intimate Partner Violence:   . Fear of Current or Ex-Partner: Not on file  . Emotionally Abused: Not on file  . Physically Abused: Not on file  . Sexually Abused: Not on file    Outpatient Medications Prior to Visit  Medication Sig Dispense Refill  . apixaban (ELIQUIS) 5 MG TABS tablet Take 1 tablet (5 mg total) by mouth 2 (two) times daily. 180 tablet 0  . atorvastatin (LIPITOR) 20 MG tablet TAKE 1 TABLET BY MOUTH AT BEDTIME 90 tablet 3  . busPIRone (BUSPAR) 15 MG tablet TAKE 1 TABLET BY MOUTH TWICE DAILY 180 tablet 1  . diphenhydrAMINE (BENADRYL) 25 MG tablet Take 25 mg by mouth at bedtime as needed for sleep.     Marland Kitchen escitalopram (LEXAPRO) 20 MG tablet TAKE 1 TABLET BY MOUTH ONCE DAILY 90 tablet 0  . indomethacin (INDOCIN) 50 MG capsule Take 1 capsule (50 mg total) by mouth 3 (three) times daily as needed. 30 capsule 1  . loratadine (CLARITIN) 10 MG tablet Take 10 mg by mouth daily.    Marland Kitchen losartan (COZAAR) 50 MG tablet TAKE 1 TABLET (50 MG TOTAL) BY MOUTH DAILY. 90 tablet 1  . metoprolol tartrate (LOPRESSOR) 25 MG tablet Take 0.5 tablets (12.5 mg total) by mouth 2 (two) times daily. 60 tablet 6  . REXULTI 2 MG TABS tablet Take 1 tablet (2 mg total) by mouth daily. 180 tablet 1  . traZODone (DESYREL) 100 MG tablet Take 1 tablet (100 mg total) by mouth at bedtime. 90 tablet 1   No facility-administered medications prior to visit.    No Known Allergies  ROS Review of Systems     Objective:    Physical Exam Constitutional:      Appearance: He is well-developed.  HENT:     Head: Normocephalic and atraumatic.  Cardiovascular:     Rate and Rhythm: Normal rate and regular rhythm.     Heart sounds: Normal heart sounds.  Pulmonary:     Effort: Pulmonary effort is  normal.     Breath sounds: Normal breath sounds.  Skin:    General: Skin is warm and dry.  Neurological:     Mental Status: He is alert and oriented to person, place, and time.  Psychiatric:        Behavior: Behavior normal.     BP 118/75   Pulse 63   Ht 5\' 9"  (1.753 m)   Wt 274 lb (124.3 kg)   SpO2 96%   BMI 40.46 kg/m  Wt Readings from Last 3 Encounters:  06/05/20 274 lb (124.3 kg)  03/06/20 273 lb (123.8 kg)  01/04/20 265 lb (120.2 kg)     There are no preventive care reminders to display for this patient.  There are no preventive care reminders to display for this patient.  Lab Results  Component Value Date   TSH 1.042 01/01/2020   Lab Results  Component Value Date   WBC 12.7 (H) 01/04/2020   HGB 14.0 01/04/2020   HCT 41.9 01/04/2020   MCV 86.6 01/04/2020   PLT 390 01/04/2020   Lab Results  Component Value Date   NA 134 (L) 12/30/2019   K 3.8 12/30/2019   CO2 22 12/30/2019   GLUCOSE 134 (H) 12/30/2019   BUN 17 12/30/2019   CREATININE 1.13 12/30/2019   BILITOT 1.0 12/30/2019   ALKPHOS 78 12/30/2019   AST 19 12/30/2019   ALT 23 12/30/2019   PROT 7.3 12/30/2019   ALBUMIN 3.4 (L) 12/30/2019   CALCIUM 8.6 (L) 12/30/2019   ANIONGAP 12 12/30/2019   Lab Results  Component Value Date   CHOL 168 11/14/2019   Lab Results  Component Value Date   HDL 50 11/14/2019   Lab Results  Component Value Date   LDLCALC 97 11/14/2019   Lab Results  Component Value Date   TRIG 113 11/14/2019   Lab Results  Component Value Date   CHOLHDL 3.4 11/14/2019   Lab Results  Component Value Date   HGBA1C 5.4 11/14/2019      Assessment & Plan:   Problem List Items  Addressed This Visit      Cardiovascular and Mediastinum   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  Was debate about whether or not he should be on it lifelong versus for just 6 months.  We can certainly have that discussion.  He is actually tolerating it well without any significant bleeding or bruising so my personal recommendation would be to continue for now.      Essential hypertension    Well controlled. Continue current regimen. Follow up in  6 mo       Relevant Orders   BASIC METABOLIC PANEL WITH GFR   PSA     Other   Daytime sleepiness    He reports that he does occasionally snore but not consistently.  Denies any apneic events.  But so I did have him complete an Epworth sleepiness scale.  Score was 15 which was greater than 10.  He has a very wide neck.  I do think he could be at significant risk of sleep apnea would recommend that we consider a home sleep test for further work-up.  He does not have uncontrolled hypertension but certainly could contribute to his mood      Current severe episode of major depressive disorder without psychotic features without prior episode ([place])    Stable. PHQ -9 score of       Class 2 severe  obesity with serious comorbidity and body mass index (BMI) of 37.0 to 37.9 in adult, unspecified obesity type ([place])    Continue to work on healthy nutrition choices and staying active.       Alcohol dependence ([place])    Not currently enrolled in a program.  Still abstinent.        Other Visit Diagnoses    Screening for prostate cancer    -  Primary   Relevant Orders   PSA   Seizures ([place])   (Chronic)        Results of the Epworth flowsheet 06/05/2020  Sitting and reading 3  Watching TV 3  Sitting, inactive in a public place (e.g. a theatre or a meeting) 1  As a passenger in a car for an hour without a break 1  Lying down to rest in the afternoon when circumstances permit 3  Sitting and talking to someone 1   Sitting quietly after a lunch without alcohol 2  In a car, while stopped for a few minutes in traffic 1  Total score 15     No orders of the defined types were placed in this encounter.   Follow-up: Return in about 6 months (around 12/04/2020) for PE.    Beatrice Lecher, MD

## 2020-06-05 NOTE — Assessment & Plan Note (Signed)
Not currently enrolled in a program.  Still abstinent.

## 2020-06-05 NOTE — Assessment & Plan Note (Signed)
He reports that he does occasionally snore but not consistently.  Denies any apneic events.  But so I did have him complete an Epworth sleepiness scale.  Score was 15 which was greater than 10.  He has a very wide neck.  I do think he could be at significant risk of sleep apnea would recommend that we consider a home sleep test for further work-up.  He does not have uncontrolled hypertension but certainly could contribute to his mood

## 2020-06-05 NOTE — Assessment & Plan Note (Signed)
Continue to work on [employer] boy choices and staying active.

## 2020-06-05 NOTE — Assessment & Plan Note (Signed)
Well controlled. Continue current regimen. Follow up in  6 mo  

## 2020-06-05 NOTE — Telephone Encounter (Signed)
Chart note sent regarding ordering a sleep study.

## 2020-06-05 NOTE — Assessment & Plan Note (Signed)
Continue with Eliquis for now.  Was debate about whether or not he should be on it lifelong versus for just 6 months.  We can certainly have that discussion.  He is actually tolerating it well without any significant bleeding or bruising so my personal recommendation would be to continue for now.

## 2020-06-05 NOTE — Assessment & Plan Note (Signed)
Stable. PHQ -9 score of

## 2020-06-28 ENCOUNTER — Other Ambulatory Visit: Payer: Self-pay | Admitting: Family Medicine

## 2020-06-28 DIAGNOSIS — I2699 Other pulmonary embolism without acute cor pulmonale: Secondary | ICD-10-CM

## 2020-07-01 ENCOUNTER — Other Ambulatory Visit: Payer: Self-pay | Admitting: Family Medicine

## 2020-07-01 MED FILL — ELIQUIS 5 MG TABLET: 5 | 90 days supply | Qty: 180 | Fill #0

## 2020-07-09 MED FILL — METOPROLOL TARTRATE 25 MG T: 25 | 60 days supply | Qty: 60 | Fill #2

## 2020-08-01 ENCOUNTER — Other Ambulatory Visit: Payer: Self-pay | Admitting: Family Medicine

## 2020-08-01 DIAGNOSIS — F32A Depression, unspecified: Secondary | ICD-10-CM

## 2020-08-01 MED FILL — busPIRone HCL 15 MG TABS: 15 | 90 days supply | Qty: 180 | Fill #1

## 2020-08-01 MED FILL — traZODone HCL 100 MG TABS: 100 | 90 days supply | Qty: 90 | Fill #1

## 2020-08-01 MED FILL — LOSARTAN POTASSIUM 50 MG TA: 50 | 90 days supply | Qty: 90 | Fill #1

## 2020-08-01 MED FILL — ATORVASTATIN CALCIUM 20 MG: 20 | 90 days supply | Qty: 90 | Fill #2

## 2020-08-02 ENCOUNTER — Other Ambulatory Visit: Payer: Self-pay | Admitting: Family Medicine

## 2020-08-02 MED FILL — ESCITALOPRAM 20 MG TABLET: 20 | 90 days supply | Qty: 90 | Fill #0

## 2020-08-07 MED FILL — REXULTI 2 MG TABLET: 2 | 90 days supply | Qty: 90 | Fill #1

## 2020-09-19 MED FILL — METOPROLOL TARTRATE 25 MG T: 25 | 60 days supply | Qty: 60 | Fill #3

## 2020-10-04 ENCOUNTER — Other Ambulatory Visit (HOSPITAL_BASED_OUTPATIENT_CLINIC_OR_DEPARTMENT_OTHER): Payer: Self-pay

## 2020-10-04 ENCOUNTER — Telehealth: Payer: Self-pay | Admitting: Family Medicine

## 2020-10-04 MED ORDER — ROSUVASTATIN CALCIUM 10 MG PO TABS
10.0000 mg | ORAL_TABLET | Freq: Every day | ORAL | 3 refills | Status: DC
  Filled 2020-10-04 – 2020-10-15 (×2): qty 90, 90d supply, fill #0
  Filled 2021-01-20: qty 90, 90d supply, fill #1
  Filled 2021-04-24: qty 90, 90d supply, fill #2
  Filled 2021-07-24: qty 90, 90d supply, fill #3

## 2020-10-04 NOTE — Telephone Encounter (Signed)
Please call patient and let him know that we received a notification from the pharmacy.  The atorvastatin that he takes can increase the sedative effects of the trazodone and cause issues with balance and fatigue.  Because they do compete for the similar pathway through the liver they are actually recommending that we switch your cholesterol pill to rosuvastatin 10 mg instead which is pretty comparable to the atorvastatin 20.  If he is okay with this then we can send in a prescription for rosuvastatin 10 mg nightly #90 with 3 refills.

## 2020-10-04 NOTE — Telephone Encounter (Signed)
Pt advised of adverse affects of medication and is ok with switching to different med.

## 2020-10-07 ENCOUNTER — Other Ambulatory Visit (HOSPITAL_BASED_OUTPATIENT_CLINIC_OR_DEPARTMENT_OTHER): Payer: Self-pay

## 2020-10-07 ENCOUNTER — Other Ambulatory Visit: Payer: Self-pay | Admitting: Family Medicine

## 2020-10-07 DIAGNOSIS — I2699 Other pulmonary embolism without acute cor pulmonale: Secondary | ICD-10-CM

## 2020-10-07 MED ORDER — ELIQUIS 5 MG PO TABS
ORAL_TABLET | Freq: Two times a day (BID) | ORAL | 0 refills | Status: DC
  Filled 2020-10-07 – 2020-10-15 (×2): qty 180, 90d supply, fill #0

## 2020-10-11 ENCOUNTER — Other Ambulatory Visit (HOSPITAL_BASED_OUTPATIENT_CLINIC_OR_DEPARTMENT_OTHER): Payer: Self-pay

## 2020-10-15 ENCOUNTER — Other Ambulatory Visit (HOSPITAL_BASED_OUTPATIENT_CLINIC_OR_DEPARTMENT_OTHER): Payer: Self-pay

## 2020-11-12 ENCOUNTER — Other Ambulatory Visit (HOSPITAL_BASED_OUTPATIENT_CLINIC_OR_DEPARTMENT_OTHER): Payer: Self-pay

## 2020-11-12 ENCOUNTER — Other Ambulatory Visit: Payer: Self-pay | Admitting: Family Medicine

## 2020-11-12 DIAGNOSIS — F32A Depression, unspecified: Secondary | ICD-10-CM

## 2020-11-12 MED FILL — Metoprolol Tartrate Tab 25 MG: ORAL | 60 days supply | Qty: 60 | Fill #0 | Status: AC

## 2020-11-12 MED FILL — Brexpiprazole Tab 2 MG: ORAL | 90 days supply | Qty: 90 | Fill #0 | Status: AC

## 2020-11-13 ENCOUNTER — Other Ambulatory Visit (HOSPITAL_BASED_OUTPATIENT_CLINIC_OR_DEPARTMENT_OTHER): Payer: Self-pay

## 2020-11-13 MED FILL — Buspirone HCl Tab 15 MG: ORAL | 90 days supply | Qty: 180 | Fill #0 | Status: AC

## 2020-11-13 MED FILL — Losartan Potassium Tab 50 MG: ORAL | 90 days supply | Qty: 90 | Fill #0 | Status: AC

## 2020-11-13 MED FILL — Buspirone HCl Tab 15 MG: ORAL | Qty: 180 | Fill #0 | Status: CN

## 2020-11-14 ENCOUNTER — Other Ambulatory Visit: Payer: Self-pay | Admitting: Family Medicine

## 2020-11-14 ENCOUNTER — Other Ambulatory Visit (HOSPITAL_BASED_OUTPATIENT_CLINIC_OR_DEPARTMENT_OTHER): Payer: Self-pay

## 2020-11-14 DIAGNOSIS — F32A Depression, unspecified: Secondary | ICD-10-CM

## 2020-11-18 ENCOUNTER — Other Ambulatory Visit (HOSPITAL_BASED_OUTPATIENT_CLINIC_OR_DEPARTMENT_OTHER): Payer: Self-pay

## 2020-11-18 ENCOUNTER — Other Ambulatory Visit: Payer: Self-pay | Admitting: Family Medicine

## 2020-11-18 DIAGNOSIS — F32A Depression, unspecified: Secondary | ICD-10-CM

## 2020-11-19 ENCOUNTER — Other Ambulatory Visit: Payer: Self-pay | Admitting: Family Medicine

## 2020-11-19 ENCOUNTER — Other Ambulatory Visit (HOSPITAL_BASED_OUTPATIENT_CLINIC_OR_DEPARTMENT_OTHER): Payer: Self-pay

## 2020-11-19 DIAGNOSIS — F32A Depression, unspecified: Secondary | ICD-10-CM

## 2020-11-20 ENCOUNTER — Other Ambulatory Visit (HOSPITAL_BASED_OUTPATIENT_CLINIC_OR_DEPARTMENT_OTHER): Payer: Self-pay

## 2020-11-20 MED FILL — Escitalopram Oxalate Tab 20 MG (Base Equiv): ORAL | 90 days supply | Qty: 90 | Fill #0 | Status: AC

## 2020-11-22 ENCOUNTER — Other Ambulatory Visit (HOSPITAL_BASED_OUTPATIENT_CLINIC_OR_DEPARTMENT_OTHER): Payer: Self-pay

## 2020-12-03 ENCOUNTER — Ambulatory Visit (INDEPENDENT_AMBULATORY_CARE_PROVIDER_SITE_OTHER): Payer: No Typology Code available for payment source | Admitting: Family Medicine

## 2020-12-03 ENCOUNTER — Other Ambulatory Visit: Payer: Self-pay

## 2020-12-03 ENCOUNTER — Encounter: Payer: Self-pay | Admitting: Family Medicine

## 2020-12-03 VITALS — BP 124/77 | HR 86 | Ht 69.0 in | Wt 271.0 lb

## 2020-12-03 DIAGNOSIS — F322 Major depressive disorder, single episode, severe without psychotic features: Secondary | ICD-10-CM

## 2020-12-03 DIAGNOSIS — I2699 Other pulmonary embolism without acute cor pulmonale: Secondary | ICD-10-CM

## 2020-12-03 DIAGNOSIS — E559 Vitamin D deficiency, unspecified: Secondary | ICD-10-CM

## 2020-12-03 DIAGNOSIS — Z6841 Body Mass Index (BMI) 40.0 and over, adult: Secondary | ICD-10-CM

## 2020-12-03 DIAGNOSIS — I10 Essential (primary) hypertension: Secondary | ICD-10-CM

## 2020-12-03 DIAGNOSIS — Z125 Encounter for screening for malignant neoplasm of prostate: Secondary | ICD-10-CM | POA: Diagnosis not present

## 2020-12-03 DIAGNOSIS — F1021 Alcohol dependence, in remission: Secondary | ICD-10-CM

## 2020-12-03 NOTE — Assessment & Plan Note (Signed)
Continue to work on increased activity levels and healthy diet.

## 2020-12-03 NOTE — Assessment & Plan Note (Signed)
Sure to continue to take an extra vitamin D daily through the fall and winter months every year.

## 2020-12-03 NOTE — Assessment & Plan Note (Signed)
Well controlled. Continue current regimen. Follow up in  6 mo  

## 2020-12-03 NOTE — Assessment & Plan Note (Signed)
Reports that he is currently still abstinent.

## 2020-12-03 NOTE — Progress Notes (Signed)
Established Patient Office Visit  Subjective:  Patient ID: Jackson Sherman, male    DOB: [birth year]  Age: 60 y.o. MRN: [id number]  CC:  Chief Complaint  Patient presents with  . Follow-up    HPI Jackson Sherman presents for follow-up pulmonary embolism.  He has been on his anticoagulant for 10 months now.  Hypertension- Pt denies chest pain, SOB, dizziness, or heart palpitations.  Taking meds as directed w/o problems.  Denies medication side effects.    Follow-up depression-overall he is doing well he is currently abstinent with alcohol.  He does complain of feeling down and depressed several days of the week but no thoughts of wanting to harm himself he is actually happy with his current regimen [facility] management including Lexapro, Rexulti,  and BuSpar.  Using trazodone for sleep.  Past Medical History:  Diagnosis Date  . 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 Anxiety   . Depression   . GERD (gastroesophageal reflux disease)   . Hemorrhoids   . Hip arthritis   . Hyperlipidemia   . Hypertension   . Joint pain   . Osteoarthritis   . Seizures ([place])    ?? in August ...more related to ETOH rehab    Past Surgical History:  Procedure Laterality Date  . ARTHROSCOPIC REPAIR ACL     left  . FRACTURE SURGERY     no surgery on broke thumb  . HEMORRHOID SURGERY N/A 11/23/2012   Procedure: HEMORRHOIDECTOMY;  Surgeon: Madilyn Hook, DO;  [facility];  Service: General;  Laterality: N/A;  . JOINT REPLACEMENT Right 12/02/2018   right total hip arthroplasty  . TONSILLECTOMY     Age 63   . TOTAL HIP ARTHROPLASTY Right 12/02/2018   Procedure: TOTAL HIP ARTHROPLASTY ANTERIOR APPROACH;  Surgeon: Dorna Leitz, MD;  [facility];  Service: Orthopedics;  Laterality: Right;  . TOTAL KNEE ARTHROPLASTY Left 06/15/2016   Procedure: LEFT TOTAL KNEE ARTHROPLASTY;  Surgeon: Vickey Huger, MD;  Location: [facility];  Service: Orthopedics;  Laterality: Left;    Family History  Problem  Relation Age of Onset  . Heart disease Father   . Heart disease Maternal Grandmother 71  . Stroke Maternal Grandmother   . Diabetes Maternal Grandmother   . Hyperlipidemia Mother   . Hypertension Mother   . Breast cancer Mother 6  . Depression Sister   . Diabetes Sister   . Diabetes Brother   . Diabetes Paternal Grandmother   . Stroke Paternal Grandmother     Social History   Socioeconomic History  . Marital status: Married    Spouse name: Not on file  . Number of children: 4  . Years of education: Not on file  . Highest education level: Not on file  Occupational History  . Occupation: retired [employer], current Financial planner: [employer]  Tobacco Use  . Smoking status: Former Smoker    Packs/day: 1.00    Years: 15.00    Pack years: 15.00    Types: Cigarettes    Quit date: 06/30/2003    Years since quitting: 17.4  . Smokeless tobacco: Never Used  Vaping Use  . Vaping Use: Never used  Substance and Sexual Activity  . Alcohol use: Not Currently  . Drug use: No  . 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  Not on file  Social History Narrative   No regular exercise.  3-4 cups per day of caffeine.     11/30/18- [facility] and has lost 33 lbs since starting with them   Social Determinants of Health   Financial Resource Strain: Not on file  Food Insecurity: Not on file  Transportation Needs: Not on file  Physical Activity: Not on file  Stress: Not on file  Social Connections: Not on file  Intimate Partner Violence: Not on file    Outpatient Medications Prior to Visit  Medication Sig Dispense Refill  . apixaban (ELIQUIS) 5 MG TABS tablet TAKE 1 TABLET BY MOUTH TWICE DAILY 180 tablet 0  . busPIRone (BUSPAR) 15 MG tablet TAKE 1 TABLET BY MOUTH TWICE DAILY 180 tablet 1   . diphenhydrAMINE (BENADRYL) 25 MG tablet Take 25 mg by mouth at bedtime as needed for sleep.     Marland Kitchen escitalopram (LEXAPRO) 20 MG tablet TAKE 1 TABLET BY MOUTH ONCE DAILY 90 tablet 0  . indomethacin (INDOCIN) 50 MG capsule Take 1 capsule (50 mg total) by mouth 3 (three) times daily as needed. 30 capsule 1  . loratadine (CLARITIN) 10 MG tablet Take 10 mg by mouth daily.    Marland Kitchen losartan (COZAAR) 50 MG tablet TAKE 1 TABLET BY MOUTH ONCE DAILY 90 tablet 1  . metoprolol tartrate (LOPRESSOR) 25 MG tablet TAKE 1/2 TABLET BY MOUTH TWO TIMES DAILY 60 tablet 6  . REXULTI 2 MG TABS tablet TAKE 1 TABLET (2 MG TOTAL) BY MOUTH DAILY. 180 tablet 1  . rosuvastatin (CRESTOR) 10 MG tablet Take 1 tablet (10 mg total) by mouth daily. 90 tablet 3  . traZODone (DESYREL) 100 MG tablet TAKE 1 TABLET (100 MG TOTAL) BY MOUTH AT BEDTIME. 90 tablet 1  . busPIRone (BUSPAR) 15 MG tablet TAKE 1 TABLET BY MOUTH TWICE DAILY 180 tablet 1   No facility-administered medications prior to visit.    No Active Allergies  ROS Review of Systems    Objective:    Physical Exam Constitutional:      Appearance: He is well-developed.  HENT:     Head: Normocephalic and atraumatic.  Cardiovascular:     Rate and Rhythm: Normal rate and regular rhythm.     Heart sounds: Normal heart sounds.  Pulmonary:     Effort: Pulmonary effort is normal.     Breath sounds: Normal breath sounds.  Skin:    General: Skin is warm and dry.  Neurological:     Mental Status: He is alert and oriented to person, place, and time.  Psychiatric:        Behavior: Behavior normal.     BP 124/77   Pulse 86   Ht 5\' 9"  (1.753 m)   Wt 271 lb (122.9 kg)   SpO2 97%   BMI 40.02 kg/m  Wt Readings from Last 3 Encounters:  12/03/20 271 lb (122.9 kg)  06/05/20 274 lb (124.3 kg)  03/06/20 273 lb (123.8 kg)     Health Maintenance Due  Topic Date Due  . Pneumococcal Vaccine 55-52 Years old (1 of 2 - PPSV23) Never done    There are no preventive  care reminders to display for this patient.  Lab Results  Component Value Date   TSH 1.042 01/01/2020   Lab Results  Component Value Date   WBC 12.7 (H) 01/04/2020   HGB 14.0 01/04/2020   HCT 41.9 01/04/2020   MCV 86.6 01/04/2020  PLT 390 01/04/2020   Lab Results  Component Value Date   NA 134 (L) 12/30/2019   K 3.8 12/30/2019   CO2 22 12/30/2019   GLUCOSE 134 (H) 12/30/2019   BUN 17 12/30/2019   CREATININE 1.13 12/30/2019   BILITOT 1.0 12/30/2019   ALKPHOS 78 12/30/2019   AST 19 12/30/2019   ALT 23 12/30/2019   PROT 7.3 12/30/2019   ALBUMIN 3.4 (L) 12/30/2019   CALCIUM 8.6 (L) 12/30/2019   ANIONGAP 12 12/30/2019   Lab Results  Component Value Date   CHOL 168 11/14/2019   Lab Results  Component Value Date   HDL 50 11/14/2019   Lab Results  Component Value Date   LDLCALC 97 11/14/2019   Lab Results  Component Value Date   TRIG 113 11/14/2019   Lab Results  Component Value Date   CHOLHDL 3.4 11/14/2019   Lab Results  Component Value Date   HGBA1C 5.4 11/14/2019      Assessment & Plan:   Problem List Items Addressed This Visit      Cardiovascular and Mediastinum   Pulmonary embolism ([place]) - bilateral  - Primary    After much discussion continue with Eliquis for potentially lifelong at this point he has been on it for almost a year as of next month.      Relevant Orders   CBC   COMPLETE METABOLIC PANEL WITH GFR   Lipid panel   PSA   Essential hypertension    Well controlled. Continue current regimen. Follow up in  6 mo       Relevant Orders   CBC   COMPLETE METABOLIC PANEL WITH GFR   Lipid panel   PSA     Other   Vitamin D deficiency    Sure to continue to take an extra vitamin D daily through the fall and winter months every year.      Current severe episode of major depressive disorder without psychotic features without prior episode ([facility])    PHQ-9 score of 5 today.  GAD-7 score of 3.  Rates symptoms as not difficult happy with  current regimen.  Continue Rexulti, Lexapro, BuSpar, and trazodone.      Class 3 severe obesity due to excess calories with serious comorbidity and body mass index (BMI) of 40.0 to 44.9 in adult [facility])    Continue to work on increased activity levels and healthy diet.      Alcohol dependence ([place])    Reports that he is currently still abstinent.       Other Visit Diagnoses    Screening for prostate cancer       Relevant Orders   PSA      No orders of the defined types were placed in this encounter.   Follow-up: Return in about 6 months (around 06/04/2021) for Hypertension.    Beatrice Lecher, MD

## 2020-12-03 NOTE — Assessment & Plan Note (Signed)
PHQ-9 score of 5 today.  GAD-7 score of 3.  Rates symptoms as not difficult happy with current regimen.  Continue Rexulti, Lexapro, BuSpar, and trazodone.

## 2020-12-03 NOTE — Patient Instructions (Signed)
Make sure to continue to take an extra vitamin D daily through the fall and winter months every year.

## 2020-12-03 NOTE — Assessment & Plan Note (Signed)
After much discussion continue with Eliquis for potentially lifelong at this point he has been on it for almost a year as of next month.

## 2020-12-04 ENCOUNTER — Ambulatory Visit: Payer: No Typology Code available for payment source | Admitting: Family Medicine

## 2020-12-04 LAB — CBC
HCT: 45.5 % (ref 38.5–50.0)
Hemoglobin: 15.2 g/dL (ref 13.2–17.1)
MCH: 28.8 pg (ref 27.0–33.0)
MCHC: 33.4 g/dL (ref 32.0–36.0)
MCV: 86.3 fL (ref 80.0–100.0)
MPV: 11.9 fL (ref 7.5–12.5)
Platelets: 349 10*3/uL (ref 140–400)
RBC: 5.27 10*6/uL (ref 4.20–5.80)
RDW: 13.2 % (ref 11.0–15.0)
WBC: 10.7 10*3/uL (ref 3.8–10.8)

## 2020-12-04 LAB — LIPID PANEL
Cholesterol: 160 mg/dL (ref <200)
HDL: 43 mg/dL (ref >=40)
LDL Cholesterol (Calc): 98 mg/dL (calc)
Non-HDL Cholesterol (Calc): 117 mg/dL (calc) (ref <130)
Total CHOL/HDL Ratio: 3.7 (calc) (ref <5.0)
Triglycerides: 99 mg/dL (ref <150)

## 2020-12-04 LAB — PSA: PSA: 0.21 ng/mL (ref <=4.00)

## 2020-12-04 LAB — COMPLETE METABOLIC PANEL WITH GFR
AG Ratio: 1.1 (calc) (ref 1.0–2.5)
ALT: 14 U/L (ref 9–46)
AST: 14 U/L (ref 10–35)
Albumin: 3.7 g/dL (ref 3.6–5.1)
Alkaline phosphatase (APISO): 87 U/L (ref 35–144)
BUN: 20 mg/dL (ref 7–25)
CO2: 21 mmol/L (ref 20–32)
Calcium: 8.9 mg/dL (ref 8.6–10.3)
Chloride: 104 mmol/L (ref 98–110)
Creat: 1 mg/dL (ref 0.70–1.25)
GFR, Est African American: 94 mL/min/{1.73_m2} (ref >=60)
GFR, Est Non African American: 81 mL/min/{1.73_m2} (ref >=60)
Globulin: 3.4 g/dL (calc) (ref 1.9–3.7)
Glucose, Bld: 102 mg/dL — ABNORMAL HIGH (ref 65–99)
Potassium: 4.5 mmol/L (ref 3.5–5.3)
Sodium: 137 mmol/L (ref 135–146)
Total Bilirubin: 0.5 mg/dL (ref 0.2–1.2)
Total Protein: 7.1 g/dL (ref 6.1–8.1)

## 2020-12-05 NOTE — Progress Notes (Signed)
All labs are normal. 

## 2021-01-20 ENCOUNTER — Other Ambulatory Visit (HOSPITAL_BASED_OUTPATIENT_CLINIC_OR_DEPARTMENT_OTHER): Payer: Self-pay

## 2021-01-20 ENCOUNTER — Other Ambulatory Visit: Payer: Self-pay | Admitting: Family Medicine

## 2021-01-20 DIAGNOSIS — I2699 Other pulmonary embolism without acute cor pulmonale: Secondary | ICD-10-CM

## 2021-01-20 MED ORDER — APIXABAN 5 MG PO TABS
ORAL_TABLET | Freq: Two times a day (BID) | ORAL | 0 refills | Status: DC
  Filled 2021-01-20: qty 180, 90d supply, fill #0

## 2021-01-20 MED FILL — Metoprolol Tartrate Tab 25 MG: ORAL | 60 days supply | Qty: 60 | Fill #1 | Status: AC

## 2021-02-16 ENCOUNTER — Other Ambulatory Visit: Payer: Self-pay | Admitting: Family Medicine

## 2021-02-16 DIAGNOSIS — F32A Depression, unspecified: Secondary | ICD-10-CM

## 2021-02-16 MED FILL — Losartan Potassium Tab 50 MG: ORAL | 90 days supply | Qty: 90 | Fill #1 | Status: AC

## 2021-02-16 MED FILL — Buspirone HCl Tab 15 MG: ORAL | 90 days supply | Qty: 180 | Fill #1 | Status: AC

## 2021-02-16 MED FILL — Brexpiprazole Tab 2 MG: ORAL | 90 days supply | Qty: 90 | Fill #1 | Status: AC

## 2021-02-16 MED FILL — Metoprolol Tartrate Tab 25 MG: ORAL | 60 days supply | Qty: 60 | Fill #2 | Status: CN

## 2021-02-17 ENCOUNTER — Other Ambulatory Visit (HOSPITAL_BASED_OUTPATIENT_CLINIC_OR_DEPARTMENT_OTHER): Payer: Self-pay

## 2021-02-18 ENCOUNTER — Other Ambulatory Visit (HOSPITAL_BASED_OUTPATIENT_CLINIC_OR_DEPARTMENT_OTHER): Payer: Self-pay

## 2021-02-18 MED ORDER — ESCITALOPRAM OXALATE 20 MG PO TABS
ORAL_TABLET | Freq: Every day | ORAL | 0 refills | Status: DC
  Filled 2021-02-18: qty 90, 90d supply, fill #0

## 2021-03-27 ENCOUNTER — Other Ambulatory Visit: Payer: Self-pay | Admitting: Family Medicine

## 2021-03-27 ENCOUNTER — Other Ambulatory Visit (HOSPITAL_BASED_OUTPATIENT_CLINIC_OR_DEPARTMENT_OTHER): Payer: Self-pay

## 2021-03-27 MED ORDER — METOPROLOL TARTRATE 25 MG PO TABS
ORAL_TABLET | Freq: Two times a day (BID) | ORAL | 6 refills | Status: DC
  Filled 2021-03-27: qty 60, 60d supply, fill #0
  Filled 2021-05-24: qty 60, 60d supply, fill #1

## 2021-03-28 ENCOUNTER — Other Ambulatory Visit (HOSPITAL_BASED_OUTPATIENT_CLINIC_OR_DEPARTMENT_OTHER): Payer: Self-pay

## 2021-04-01 ENCOUNTER — Other Ambulatory Visit: Payer: Self-pay

## 2021-04-24 ENCOUNTER — Other Ambulatory Visit: Payer: Self-pay | Admitting: Family Medicine

## 2021-04-24 DIAGNOSIS — I2699 Other pulmonary embolism without acute cor pulmonale: Secondary | ICD-10-CM

## 2021-04-25 ENCOUNTER — Other Ambulatory Visit (HOSPITAL_BASED_OUTPATIENT_CLINIC_OR_DEPARTMENT_OTHER): Payer: Self-pay

## 2021-04-25 MED ORDER — APIXABAN 5 MG PO TABS
ORAL_TABLET | Freq: Two times a day (BID) | ORAL | 0 refills | Status: DC
  Filled 2021-04-25: qty 180, 90d supply, fill #0

## 2021-05-24 ENCOUNTER — Other Ambulatory Visit: Payer: Self-pay | Admitting: Family Medicine

## 2021-05-24 DIAGNOSIS — F32A Depression, unspecified: Secondary | ICD-10-CM

## 2021-05-26 ENCOUNTER — Other Ambulatory Visit (HOSPITAL_BASED_OUTPATIENT_CLINIC_OR_DEPARTMENT_OTHER): Payer: Self-pay

## 2021-05-26 MED ORDER — LOSARTAN POTASSIUM 50 MG PO TABS
ORAL_TABLET | Freq: Every day | ORAL | 1 refills | Status: DC
  Filled 2021-05-26: qty 90, 90d supply, fill #0
  Filled 2021-08-26: qty 90, 90d supply, fill #1

## 2021-05-26 MED ORDER — ESCITALOPRAM OXALATE 20 MG PO TABS
ORAL_TABLET | Freq: Every day | ORAL | 0 refills | Status: DC
  Filled 2021-05-26: qty 90, 90d supply, fill #0

## 2021-05-26 MED ORDER — BUSPIRONE HCL 15 MG PO TABS
ORAL_TABLET | Freq: Two times a day (BID) | ORAL | 1 refills | Status: DC
  Filled 2021-05-26: qty 180, 90d supply, fill #0
  Filled 2021-07-24 – 2021-08-26 (×2): qty 180, 90d supply, fill #1

## 2021-06-04 ENCOUNTER — Encounter: Payer: Self-pay | Admitting: Family Medicine

## 2021-06-04 ENCOUNTER — Other Ambulatory Visit (HOSPITAL_BASED_OUTPATIENT_CLINIC_OR_DEPARTMENT_OTHER): Payer: Self-pay

## 2021-06-04 ENCOUNTER — Other Ambulatory Visit: Payer: Self-pay

## 2021-06-04 ENCOUNTER — Ambulatory Visit (INDEPENDENT_AMBULATORY_CARE_PROVIDER_SITE_OTHER): Payer: No Typology Code available for payment source | Admitting: Family Medicine

## 2021-06-04 VITALS — BP 120/82 | HR 78 | Ht 69.0 in | Wt 269.0 lb

## 2021-06-04 DIAGNOSIS — E559 Vitamin D deficiency, unspecified: Secondary | ICD-10-CM | POA: Diagnosis not present

## 2021-06-04 DIAGNOSIS — F339 Major depressive disorder, recurrent, unspecified: Secondary | ICD-10-CM

## 2021-06-04 DIAGNOSIS — I2699 Other pulmonary embolism without acute cor pulmonale: Secondary | ICD-10-CM

## 2021-06-04 DIAGNOSIS — I10 Essential (primary) hypertension: Secondary | ICD-10-CM | POA: Diagnosis not present

## 2021-06-04 DIAGNOSIS — E8881 Metabolic syndrome: Secondary | ICD-10-CM

## 2021-06-04 DIAGNOSIS — E790 Hyperuricemia without signs of inflammatory arthritis and tophaceous disease: Secondary | ICD-10-CM

## 2021-06-04 DIAGNOSIS — Z23 Encounter for immunization: Secondary | ICD-10-CM | POA: Diagnosis not present

## 2021-06-04 MED ORDER — METOPROLOL TARTRATE 25 MG PO TABS
12.5000 mg | ORAL_TABLET | Freq: Two times a day (BID) | ORAL | 3 refills | Status: DC
  Filled 2021-06-04: qty 90, fill #0
  Filled 2021-07-24: qty 90, 90d supply, fill #0
  Filled 2021-10-27: qty 90, 90d supply, fill #1
  Filled 2022-02-02: qty 90, 90d supply, fill #2
  Filled 2022-05-11: qty 90, 90d supply, fill #3

## 2021-06-04 MED ORDER — APIXABAN 5 MG PO TABS
5.0000 mg | ORAL_TABLET | Freq: Two times a day (BID) | ORAL | 3 refills | Status: DC
  Filled 2021-06-04: qty 180, fill #0
  Filled 2021-07-24: qty 180, 90d supply, fill #0
  Filled 2021-10-31: qty 180, 90d supply, fill #1
  Filled 2022-02-24: qty 60, 30d supply, fill #2

## 2021-06-04 MED ORDER — INDOMETHACIN 50 MG PO CAPS
50.0000 mg | ORAL_CAPSULE | Freq: Three times a day (TID) | ORAL | 1 refills | Status: AC | PRN
  Filled 2021-06-04: qty 90, 30d supply, fill #0

## 2021-06-04 NOTE — Assessment & Plan Note (Signed)
Stable on his current regimen of Lexapro and BuSpar.  Follow-up in 6 months.

## 2021-06-04 NOTE — Assessment & Plan Note (Signed)
To recheck hemoglobin A1c.

## 2021-06-04 NOTE — Assessment & Plan Note (Signed)
On Eliquis lifelong.  Doing well with the medication.

## 2021-06-04 NOTE — Assessment & Plan Note (Signed)
Well controlled. Continue current regimen. Follow up in  6 mo  

## 2021-06-04 NOTE — Progress Notes (Signed)
Established Patient Office Visit  Subjective:  Patient ID: Jackson Sherman, male    DOB: Nov 04, 1960  Age: 60 y.o. MRN: [id number]  CC:  Chief Complaint  Patient presents with   Hypertension    HPI [name] presents for   Hypertension- Pt denies chest pain, SOB, dizziness, or heart palpitations.  Taking meds as directed w/o problems.  Denies medication side effects.    Impaired fasting glucose-no increased thirst or urination. No symptoms consistent with hypoglycemia.  Hx of alcohol abuse -he says he is still sober.  He has not been to an [facility] meeting recently.  He is doing well at work.   Past Medical History:  Diagnosis Date   Alcohol rehabilitation 03-2005, 03-2007   Anxiety    Depression    GERD (gastroesophageal reflux disease)    Hemorrhoids    Hip arthritis    Hyperlipidemia    Hypertension    Joint pain    Osteoarthritis    Seizures ([place])    ?? in August ...more related to ETOH rehab    Past Surgical History:  Procedure Laterality Date   ARTHROSCOPIC REPAIR ACL     left   FRACTURE SURGERY     no surgery on broke thumb   HEMORRHOID SURGERY N/A 11/23/2012   Procedure: HEMORRHOIDECTOMY;  Surgeon: Madilyn Hook, DO;  [facility];  Service: General;  Laterality: N/A;   JOINT REPLACEMENT Right 12/02/2018   right total hip arthroplasty   TONSILLECTOMY     Age 64    TOTAL HIP ARTHROPLASTY Right 12/02/2018   Procedure: TOTAL HIP ARTHROPLASTY ANTERIOR APPROACH;  Surgeon: Dorna Leitz, MD;  [facility];  Service: Orthopedics;  Laterality: Right;   TOTAL KNEE ARTHROPLASTY Left 06/15/2016   Procedure: LEFT TOTAL KNEE ARTHROPLASTY;  Surgeon: Vickey Huger, MD;  Location: [facility];  Service: Orthopedics;  Laterality: Left;    Family History  Problem Relation Age of Onset   Heart disease Father    Heart disease Maternal Grandmother 16   Stroke Maternal Grandmother    Diabetes Maternal Grandmother    Hyperlipidemia Mother    Hypertension Mother     Breast cancer Mother 56   Depression Sister    Diabetes Sister    Diabetes Brother    Diabetes Paternal Grandmother    Stroke Paternal Grandmother     Social History   Socioeconomic History   Marital status: Married    Spouse name: Not on file   Number of children: 4   Years of education: Not on file   Highest education level: Not on file  Occupational History   Occupation: retired [employer], current Cytogeneticist [employer]    Employer: [employer]  Tobacco Use   Smoking status: Former    Packs/day: 1.00    Years: 15.00    Pack years: 15.00    Types: Cigarettes    Quit date: 06/30/2003    Years since quitting: 17.9   Smokeless tobacco: Never  Vaping Use   Vaping Use: Never used  Substance and Sexual Activity   Alcohol use: Not Currently   Drug use: No   Sexual activity: Yes    Partners: Female    Comment: married, 4 kids, works for [employer], [employer] care [employer], trying to lose wt., started exercising program  Other Topics Concern   Not on file  Social History Narrative   No regular exercise.  3-4 cups per day of caffeine.     11/30/18- attends [facility] and has lost 33 lbs since starting with them   Social Determinants of Health   Financial Resource Strain: Not on file  Food Insecurity: Not on file  Transportation Needs: Not on file  Physical Activity: Not on file  Stress: Not on file  Social Connections: Not on file  Intimate Partner Violence: Not on file    Outpatient Medications Prior to Visit  Medication Sig Dispense Refill   busPIRone (BUSPAR) 15 MG tablet TAKE 1 TABLET BY MOUTH TWICE DAILY 180 tablet 1   diphenhydrAMINE (BENADRYL) 25 MG tablet Take 25 mg by mouth at bedtime as needed for sleep.      escitalopram (LEXAPRO) 20 MG tablet TAKE 1 TABLET BY MOUTH ONCE DAILY 90 tablet 0   loratadine (CLARITIN) 10 MG tablet Take 10 mg by mouth daily.     losartan (COZAAR) 50 MG tablet TAKE 1 TABLET BY MOUTH ONCE DAILY 90 tablet 1    rosuvastatin (CRESTOR) 10 MG tablet Take 1 tablet (10 mg total) by mouth daily. 90 tablet 3   apixaban (ELIQUIS) 5 MG TABS tablet TAKE 1 TABLET BY MOUTH TWICE DAILY 180 tablet 0   indomethacin (INDOCIN) 50 MG capsule Take 1 capsule (50 mg total) by mouth 3 (three) times daily as needed. 30 capsule 1   metoprolol tartrate (LOPRESSOR) 25 MG tablet TAKE 1/2 TABLET BY MOUTH TWO TIMES DAILY 60 tablet 6   traZODone (DESYREL) 100 MG tablet TAKE 1 TABLET (100 MG TOTAL) BY MOUTH AT BEDTIME. 90 tablet 1   No facility-administered medications prior to visit.    No Active Allergies  ROS Review of Systems    Objective:    Physical Exam Constitutional:      Appearance: Normal appearance. He is well-developed.     Comments: Seem flat today  HENT:     Head: Normocephalic and atraumatic.  Cardiovascular:     Rate and Rhythm: Normal rate and regular rhythm.     Heart sounds: Normal heart sounds.  Pulmonary:     Effort: Pulmonary effort is normal.     Breath sounds: Normal breath sounds.  Skin:    General: Skin is warm and dry.  Neurological:     Mental Status: He is alert and oriented to person, place, and time. Mental status is at baseline.  Psychiatric:        Behavior: Behavior normal.    BP 120/82   Pulse 78   Ht 5\' 9"  (1.753 m)   Wt 269 lb (122 kg)   SpO2 96%   BMI 39.72 kg/m  Wt Readings from Last 3 Encounters:  06/04/21 269 lb (122 kg)  12/03/20 271 lb (122.9 kg)  06/05/20 274 lb (124.3 kg)     Health Maintenance Due  Topic Date Due   COVID-19 Vaccine (3 - Booster for Pfizer series) 11/15/2019    There are no preventive care reminders to display for this patient.  Lab Results  Component Value Date   TSH 1.042 01/01/2020   Lab Results  Component Value Date   WBC 10.7 12/03/2020   HGB 15.2 12/03/2020   HCT 45.5 12/03/2020   MCV 86.3 12/03/2020   PLT 349 12/03/2020   Lab Results  Component Value Date   NA 137 12/03/2020   K 4.5 12/03/2020   CO2 21  12/03/2020   GLUCOSE 102 (H) 12/03/2020   BUN 20 12/03/2020   CREATININE 1.00 12/03/2020   BILITOT 0.5 12/03/2020   ALKPHOS 78 12/30/2019  AST 14 12/03/2020   ALT 14 12/03/2020   PROT 7.1 12/03/2020   ALBUMIN 3.4 (L) 12/30/2019   CALCIUM 8.9 12/03/2020   ANIONGAP 12 12/30/2019   Lab Results  Component Value Date   CHOL 160 12/03/2020   Lab Results  Component Value Date   HDL 43 12/03/2020   Lab Results  Component Value Date   LDLCALC 98 12/03/2020   Lab Results  Component Value Date   TRIG 99 12/03/2020   Lab Results  Component Value Date   CHOLHDL 3.7 12/03/2020   Lab Results  Component Value Date   HGBA1C 5.4 11/14/2019      Assessment & Plan:   Problem List Items Addressed This Visit       Cardiovascular and Mediastinum   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  Doing well with the medication.      Relevant Medications   apixaban (ELIQUIS) 5 MG TABS tablet   metoprolol tartrate (LOPRESSOR) 25 MG tablet   Essential hypertension - Primary    Well controlled. Continue current regimen. Follow up in  6 mo       Relevant Medications   apixaban (ELIQUIS) 5 MG TABS tablet   metoprolol tartrate (LOPRESSOR) 25 MG tablet   Other Relevant Orders   BASIC METABOLIC PANEL WITH GFR   Hemoglobin A1c   Uric acid     Endocrine   Insulin resistance    To recheck hemoglobin A1c.      Relevant Orders   Hemoglobin A1c     Other   Vitamin D deficiency   Relevant Orders   Vitamin D (25 hydroxy)   Unipolar depression ([place])    Stable on his current regimen of Lexapro and BuSpar.  Follow-up in 6 months.      Elevated uric acid in blood   Relevant Orders   Uric acid   Other Visit Diagnoses     Need for pneumococcal vaccination       Relevant Orders   Pneumococcal conjugate vaccine 20-valent (Prevnar 20) (Completed)   Bilateral pulmonary embolism (HCC)       Relevant Medications   apixaban (ELIQUIS) 5 MG TABS tablet   metoprolol  tartrate (LOPRESSOR) 25 MG tablet       Meds ordered this encounter  Medications   indomethacin (INDOCIN) 50 MG capsule    Sig: Take 1 capsule (50 mg total) by mouth 3 (three) times daily as needed.    Dispense:  90 capsule    Refill:  1   apixaban (ELIQUIS) 5 MG TABS tablet    Sig: TAKE 1 TABLET BY MOUTH TWICE DAILY    Dispense:  180 tablet    Refill:  3   metoprolol tartrate (LOPRESSOR) 25 MG tablet    Sig: TAKE 1/2 TABLET BY MOUTH TWO TIMES DAILY    Dispense:  90 tablet    Refill:  3    Follow-up: Return in about 6 months (around 12/03/2021) for Hypertension and Mood.    Beatrice Lecher, MD

## 2021-06-05 LAB — BASIC METABOLIC PANEL WITH GFR
BUN: 15 mg/dL (ref 7–25)
CO2: 27 mmol/L (ref 20–32)
Calcium: 8.9 mg/dL (ref 8.6–10.3)
Chloride: 102 mmol/L (ref 98–110)
Creat: 1.08 mg/dL (ref 0.70–1.35)
Glucose, Bld: 96 mg/dL (ref 65–99)
Potassium: 4.7 mmol/L (ref 3.5–5.3)
Sodium: 138 mmol/L (ref 135–146)
eGFR: 78 mL/min/{1.73_m2} (ref >=60)

## 2021-06-05 LAB — VITAMIN D 25 HYDROXY (VIT D DEFICIENCY, FRACTURES): Vit D, 25-Hydroxy: 29 ng/mL — ABNORMAL LOW (ref 30–100)

## 2021-06-05 LAB — HEMOGLOBIN A1C
Hgb A1c MFr Bld: 5.8 % of total Hgb — ABNORMAL HIGH (ref <5.7)
Mean Plasma Glucose: 120 mg/dL
eAG (mmol/L): 6.6 mmol/L

## 2021-06-05 LAB — URIC ACID: Uric Acid, Serum: 7.2 mg/dL (ref 4.0–8.0)

## 2021-06-05 LAB — PSA: PSA: 0.28 ng/mL (ref <=4.00)

## 2021-06-05 NOTE — Progress Notes (Signed)
Hi Phil,  Your metabolic panel looks great.  Calcium is normal this time which is great.  Prostate test is normal.  A1c did go up slightly to 5.8 back into the prediabetes range to just really make sure that you are portion controlling things like carbs and sweets and not eating too much of those.  Also really encourage you to work on regular exercise and like you to really start walking for about 15 to 20 minutes 5 days/week.  Her vitamin D is low so please start an over-the-counter vitamin D 25 mcg daily.  Uric acid is a little elevated we usually like to have it less than 6 such as work on a low purine diet as well.

## 2021-06-11 ENCOUNTER — Other Ambulatory Visit (HOSPITAL_BASED_OUTPATIENT_CLINIC_OR_DEPARTMENT_OTHER): Payer: Self-pay

## 2021-07-02 ENCOUNTER — Other Ambulatory Visit (HOSPITAL_BASED_OUTPATIENT_CLINIC_OR_DEPARTMENT_OTHER): Payer: Self-pay

## 2021-07-02 ENCOUNTER — Other Ambulatory Visit: Payer: Self-pay | Admitting: Family Medicine

## 2021-07-02 DIAGNOSIS — F32A Depression, unspecified: Secondary | ICD-10-CM

## 2021-07-02 DIAGNOSIS — F322 Major depressive disorder, single episode, severe without psychotic features: Secondary | ICD-10-CM

## 2021-07-02 MED ORDER — REXULTI 2 MG PO TABS
2.0000 mg | ORAL_TABLET | Freq: Every day | ORAL | 1 refills | Status: DC
  Filled 2021-07-02: qty 90, 90d supply, fill #0
  Filled 2021-10-06: qty 90, 90d supply, fill #1
  Filled 2022-01-09: qty 90, 90d supply, fill #2

## 2021-07-24 ENCOUNTER — Other Ambulatory Visit (HOSPITAL_BASED_OUTPATIENT_CLINIC_OR_DEPARTMENT_OTHER): Payer: Self-pay

## 2021-07-31 ENCOUNTER — Other Ambulatory Visit (HOSPITAL_BASED_OUTPATIENT_CLINIC_OR_DEPARTMENT_OTHER): Payer: Self-pay

## 2021-08-26 ENCOUNTER — Other Ambulatory Visit: Payer: Self-pay | Admitting: Family Medicine

## 2021-08-26 ENCOUNTER — Other Ambulatory Visit (HOSPITAL_BASED_OUTPATIENT_CLINIC_OR_DEPARTMENT_OTHER): Payer: Self-pay

## 2021-08-26 DIAGNOSIS — G4700 Insomnia, unspecified: Secondary | ICD-10-CM

## 2021-08-26 DIAGNOSIS — F32A Depression, unspecified: Secondary | ICD-10-CM

## 2021-08-26 MED ORDER — TRAZODONE HCL 100 MG PO TABS
ORAL_TABLET | Freq: Every day | ORAL | 1 refills | Status: DC
  Filled 2021-08-26: qty 90, 90d supply, fill #0

## 2021-08-26 MED ORDER — ESCITALOPRAM OXALATE 20 MG PO TABS
ORAL_TABLET | Freq: Every day | ORAL | 1 refills | Status: DC
  Filled 2021-08-26: qty 90, 90d supply, fill #0
  Filled 2021-12-07: qty 90, 90d supply, fill #1

## 2021-10-06 ENCOUNTER — Other Ambulatory Visit (HOSPITAL_BASED_OUTPATIENT_CLINIC_OR_DEPARTMENT_OTHER): Payer: Self-pay

## 2021-10-27 ENCOUNTER — Other Ambulatory Visit (HOSPITAL_BASED_OUTPATIENT_CLINIC_OR_DEPARTMENT_OTHER): Payer: Self-pay

## 2021-10-27 ENCOUNTER — Other Ambulatory Visit: Payer: Self-pay | Admitting: Family Medicine

## 2021-10-27 MED ORDER — ROSUVASTATIN CALCIUM 10 MG PO TABS
10.0000 mg | ORAL_TABLET | Freq: Every day | ORAL | 3 refills | Status: DC
  Filled 2021-10-27: qty 90, 90d supply, fill #0
  Filled 2022-01-09 – 2022-01-20 (×2): qty 90, 90d supply, fill #1
  Filled 2022-05-27: qty 90, 90d supply, fill #2
  Filled 2022-08-22 – 2022-08-25 (×2): qty 90, 90d supply, fill #3

## 2021-10-31 ENCOUNTER — Other Ambulatory Visit (HOSPITAL_BASED_OUTPATIENT_CLINIC_OR_DEPARTMENT_OTHER): Payer: Self-pay

## 2021-11-03 ENCOUNTER — Encounter: Payer: Self-pay | Admitting: Family Medicine

## 2021-11-03 ENCOUNTER — Ambulatory Visit (INDEPENDENT_AMBULATORY_CARE_PROVIDER_SITE_OTHER): Payer: No Typology Code available for payment source | Admitting: Family Medicine

## 2021-11-03 ENCOUNTER — Ambulatory Visit: Payer: No Typology Code available for payment source | Admitting: Physician Assistant

## 2021-11-03 VITALS — BP 122/86 | HR 88 | Resp 18 | Ht 69.0 in | Wt 263.0 lb

## 2021-11-03 DIAGNOSIS — F1021 Alcohol dependence, in remission: Secondary | ICD-10-CM

## 2021-11-03 DIAGNOSIS — I10 Essential (primary) hypertension: Secondary | ICD-10-CM | POA: Diagnosis not present

## 2021-11-03 NOTE — Progress Notes (Signed)
? ?  Established Patient Office Visit ? ?Subjective   ?Patient ID: Jackson Sherman, male    DOB: 01/29/1961  Age: 61 y.o. MRN: [id number] ? ?Chief Complaint  ?Patient presents with  ? Discuss Letter   ?  Patient requesting for work note to return to work on Friday 11/07/21.   ? ? ?HPI ? ? ?He reports that last Sunday he decided to drink and then drive to work and he was sent home.  He had been abstinent up until that point.  I asked him if there was anything specific going on that triggered his drinking on Sunday and he said no.  He says he has not had a drink again since then.  He feels like overall his mood is stable.  Though he did mark some of the depression and anxiety symptoms but he says he marked some of those in particular because of what happened on Sunday. ? ? ? ? ?ROS ? ?  ?Objective:  ?  ? ?BP 122/86   Pulse 88   Resp 18   Ht '5\' 9"'$  (1.753 m)   Wt 263 lb (119.3 kg)   SpO2 95%   BMI 38.84 kg/m?  ? ? ?Physical Exam ?Constitutional:   ?   Appearance: He is well-developed.  ?HENT:  ?   Head: Normocephalic and atraumatic.  ?Pulmonary:  ?   Effort: Pulmonary effort is normal.  ?Skin: ?   General: Skin is warm and dry.  ?Neurological:  ?   Mental Status: He is alert and oriented to person, place, and time.  ?Psychiatric:     ?   Behavior: Behavior normal.  ? ? ? ?No results found for any visits on 11/03/21. ? ? ? ?The 10-year ASCVD risk score (Arnett DK, et al., 2019) is: 9.2% ? ?  ?Assessment & Plan:  ? ?Problem List Items Addressed This Visit   ? ?  ? Cardiovascular and Mediastinum  ? Essential hypertension - Primary  ?  ? Other  ? Alcohol dependence ([place])  ? ? ?Alcohol dependence-does not sound like there are any specific triggers that triggered her drinking on Sunday.  Okay to return to work on Friday I did highly encourage him to reach out to [place] which she was previously engaged with but is no longer.  I think that can provide some good support for him.  We also discussed putting him on medication to  reduce his risk for desire for drinking but he declined. ? ?Return in about 6 weeks (around 12/15/2021) for A!C and labs .  ? ? ?Beatrice Lecher, MD ? ?

## 2021-11-19 ENCOUNTER — Other Ambulatory Visit: Payer: Self-pay | Admitting: Family Medicine

## 2021-11-19 ENCOUNTER — Other Ambulatory Visit (HOSPITAL_BASED_OUTPATIENT_CLINIC_OR_DEPARTMENT_OTHER): Payer: Self-pay

## 2021-11-19 DIAGNOSIS — F32A Depression, unspecified: Secondary | ICD-10-CM

## 2021-11-20 ENCOUNTER — Other Ambulatory Visit (HOSPITAL_BASED_OUTPATIENT_CLINIC_OR_DEPARTMENT_OTHER): Payer: Self-pay

## 2021-11-20 MED ORDER — BUSPIRONE HCL 15 MG PO TABS
ORAL_TABLET | Freq: Two times a day (BID) | ORAL | 1 refills | Status: DC
  Filled 2021-11-20: qty 180, 90d supply, fill #0
  Filled 2022-02-24: qty 180, 90d supply, fill #1

## 2021-12-03 ENCOUNTER — Ambulatory Visit (INDEPENDENT_AMBULATORY_CARE_PROVIDER_SITE_OTHER): Payer: Self-pay | Admitting: Family Medicine

## 2021-12-03 ENCOUNTER — Encounter: Payer: Self-pay | Admitting: Family Medicine

## 2021-12-03 VITALS — BP 115/74 | HR 76 | Resp 16 | Ht 69.0 in | Wt 262.0 lb

## 2021-12-03 DIAGNOSIS — F322 Major depressive disorder, single episode, severe without psychotic features: Secondary | ICD-10-CM

## 2021-12-03 DIAGNOSIS — E8881 Metabolic syndrome: Secondary | ICD-10-CM

## 2021-12-03 DIAGNOSIS — F339 Major depressive disorder, recurrent, unspecified: Secondary | ICD-10-CM

## 2021-12-03 DIAGNOSIS — E785 Hyperlipidemia, unspecified: Secondary | ICD-10-CM

## 2021-12-03 DIAGNOSIS — I10 Essential (primary) hypertension: Secondary | ICD-10-CM

## 2021-12-03 DIAGNOSIS — F1029 Alcohol dependence with unspecified alcohol-induced disorder: Secondary | ICD-10-CM

## 2021-12-03 NOTE — Progress Notes (Signed)
   Established Patient Office Visit  Subjective   Patient ID: Jackson Sherman, male    DOB: 07-26-1960  Age: 61 y.o. MRN: [id number]  Chief Complaint  Patient presents with   Hypertension    Follow up    Mood Follow up     HPI  Hypertension- Pt denies chest pain, SOB, dizziness, or heart palpitations.  Taking meds as directed w/o problems.  Denies medication side effects.    Hyperlipidemia - tolerating stating well with no myalgias or significant side effects.  Lab Results  Component Value Date   CHOL 160 12/03/2020   HDL 43 12/03/2020   LDLCALC 98 12/03/2020   TRIG 99 12/03/2020   CHOLHDL 3.7 12/03/2020    Impaired fasting glucose-no increased thirst or urination. No symptoms consistent with hypoglycemia.  He did get let go from his job a couple of weeks ago so is trying to decide if he is going to officially retire or look for something new he is not quite sure.     ROS    Objective:     BP 115/74   Pulse 76   Resp 16   Ht '5\' 9"'$  (1.753 m)   Wt 262 lb (118.8 kg)   SpO2 95%   BMI 38.69 kg/m    Physical Exam Constitutional:      Appearance: He is well-developed.  HENT:     Head: Normocephalic and atraumatic.  Cardiovascular:     Rate and Rhythm: Normal rate and regular rhythm.     Heart sounds: Normal heart sounds.  Pulmonary:     Effort: Pulmonary effort is normal.     Breath sounds: Normal breath sounds.  Skin:    General: Skin is warm and dry.  Neurological:     Mental Status: He is alert and oriented to person, place, and time.  Psychiatric:        Behavior: Behavior normal.     No results found for any visits on 12/03/21.    The 10-year ASCVD risk score (Arnett DK, et al., 2019) is: 9%    Assessment & Plan:   Problem List Items Addressed This Visit       Cardiovascular and Mediastinum   Essential hypertension - Primary   Relevant Orders   Lipid Panel w/reflex Direct LDL   COMPLETE METABOLIC PANEL WITH GFR   CBC   Hemoglobin  A1c     Endocrine   Insulin resistance    Due to check A1c today.       Relevant Orders   Lipid Panel w/reflex Direct LDL   COMPLETE METABOLIC PANEL WITH GFR   CBC   Hemoglobin A1c     Other   Unipolar depression ([place])   Hyperlipidemia    Due to recheck lipids to make sure at goal.  Last her LDL was just under 100.  Continue Crestor.       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  He is not currently engaged in any dependence services.        Return in about 6 months (around 06/04/2022) for Hypertension.    Beatrice Lecher, MD

## 2021-12-03 NOTE — Assessment & Plan Note (Signed)
Due to recheck lipids to make sure at goal.  Last her LDL was just under 100.  Continue Crestor.

## 2021-12-03 NOTE — Assessment & Plan Note (Signed)
Due to check A1c today.

## 2021-12-03 NOTE — Assessment & Plan Note (Signed)
Encouraged him to consider joining AA again.  He is not currently engaged in any dependence services.

## 2021-12-04 LAB — COMPLETE METABOLIC PANEL WITH GFR
AG Ratio: 1.1 (calc) (ref 1.0–2.5)
ALT: 17 U/L (ref 9–46)
AST: 16 U/L (ref 10–35)
Albumin: 4 g/dL (ref 3.6–5.1)
Alkaline phosphatase (APISO): 91 U/L (ref 35–144)
BUN: 18 mg/dL (ref 7–25)
CO2: 25 mmol/L (ref 20–32)
Calcium: 9.4 mg/dL (ref 8.6–10.3)
Chloride: 104 mmol/L (ref 98–110)
Creat: 1.15 mg/dL (ref 0.70–1.35)
Globulin: 3.6 g/dL (calc) (ref 1.9–3.7)
Glucose, Bld: 95 mg/dL (ref 65–99)
Potassium: 4.9 mmol/L (ref 3.5–5.3)
Sodium: 137 mmol/L (ref 135–146)
Total Bilirubin: 0.5 mg/dL (ref 0.2–1.2)
Total Protein: 7.6 g/dL (ref 6.1–8.1)
eGFR: 72 mL/min/{1.73_m2} (ref >=60)

## 2021-12-04 LAB — LIPID PANEL W/REFLEX DIRECT LDL
Cholesterol: 163 mg/dL (ref <200)
HDL: 43 mg/dL (ref >=40)
LDL Cholesterol (Calc): 97 mg/dL (calc)
Non-HDL Cholesterol (Calc): 120 mg/dL (calc) (ref <130)
Total CHOL/HDL Ratio: 3.8 (calc) (ref <5.0)
Triglycerides: 131 mg/dL (ref <150)

## 2021-12-04 LAB — CBC
HCT: 47.3 % (ref 38.5–50.0)
Hemoglobin: 15.7 g/dL (ref 13.2–17.1)
MCH: 28.5 pg (ref 27.0–33.0)
MCHC: 33.2 g/dL (ref 32.0–36.0)
MCV: 86 fL (ref 80.0–100.0)
MPV: 12.5 fL (ref 7.5–12.5)
Platelets: 350 10*3/uL (ref 140–400)
RBC: 5.5 10*6/uL (ref 4.20–5.80)
RDW: 13.4 % (ref 11.0–15.0)
WBC: 11 10*3/uL — ABNORMAL HIGH (ref 3.8–10.8)

## 2021-12-04 LAB — HEMOGLOBIN A1C
Hgb A1c MFr Bld: 5.7 % of total Hgb — ABNORMAL HIGH (ref <5.7)
Mean Plasma Glucose: 117 mg/dL
eAG (mmol/L): 6.5 mmol/L

## 2021-12-04 NOTE — Progress Notes (Signed)
Hi Phil,  Your metabolic panel is normal.  A1c is still in the prediabetes range so continue to work on cutting back on sweets and carbs and increase activity level.  Blood count is stable.

## 2021-12-08 ENCOUNTER — Other Ambulatory Visit (HOSPITAL_BASED_OUTPATIENT_CLINIC_OR_DEPARTMENT_OTHER): Payer: Self-pay

## 2022-01-09 ENCOUNTER — Other Ambulatory Visit: Payer: Self-pay | Admitting: Family Medicine

## 2022-01-09 ENCOUNTER — Other Ambulatory Visit (HOSPITAL_BASED_OUTPATIENT_CLINIC_OR_DEPARTMENT_OTHER): Payer: Self-pay

## 2022-01-09 MED ORDER — LOSARTAN POTASSIUM 50 MG PO TABS
ORAL_TABLET | Freq: Every day | ORAL | 1 refills | Status: DC
  Filled 2022-01-09 – 2022-01-20 (×2): qty 90, 90d supply, fill #0
  Filled 2022-05-11: qty 90, 90d supply, fill #1

## 2022-01-19 ENCOUNTER — Other Ambulatory Visit (HOSPITAL_BASED_OUTPATIENT_CLINIC_OR_DEPARTMENT_OTHER): Payer: Self-pay

## 2022-01-20 ENCOUNTER — Other Ambulatory Visit (HOSPITAL_BASED_OUTPATIENT_CLINIC_OR_DEPARTMENT_OTHER): Payer: Self-pay

## 2022-02-02 ENCOUNTER — Other Ambulatory Visit (HOSPITAL_BASED_OUTPATIENT_CLINIC_OR_DEPARTMENT_OTHER): Payer: Self-pay

## 2022-02-15 IMAGING — DX DG CHEST 1V PORT
1 series · 1 of 1 positions shown · non-contrast
Comparison: 11/24/2018

CLINICAL DATA: Chest pain, shortness of breath

EXAM:
PORTABLE CHEST 1 VIEW

[chest ap]
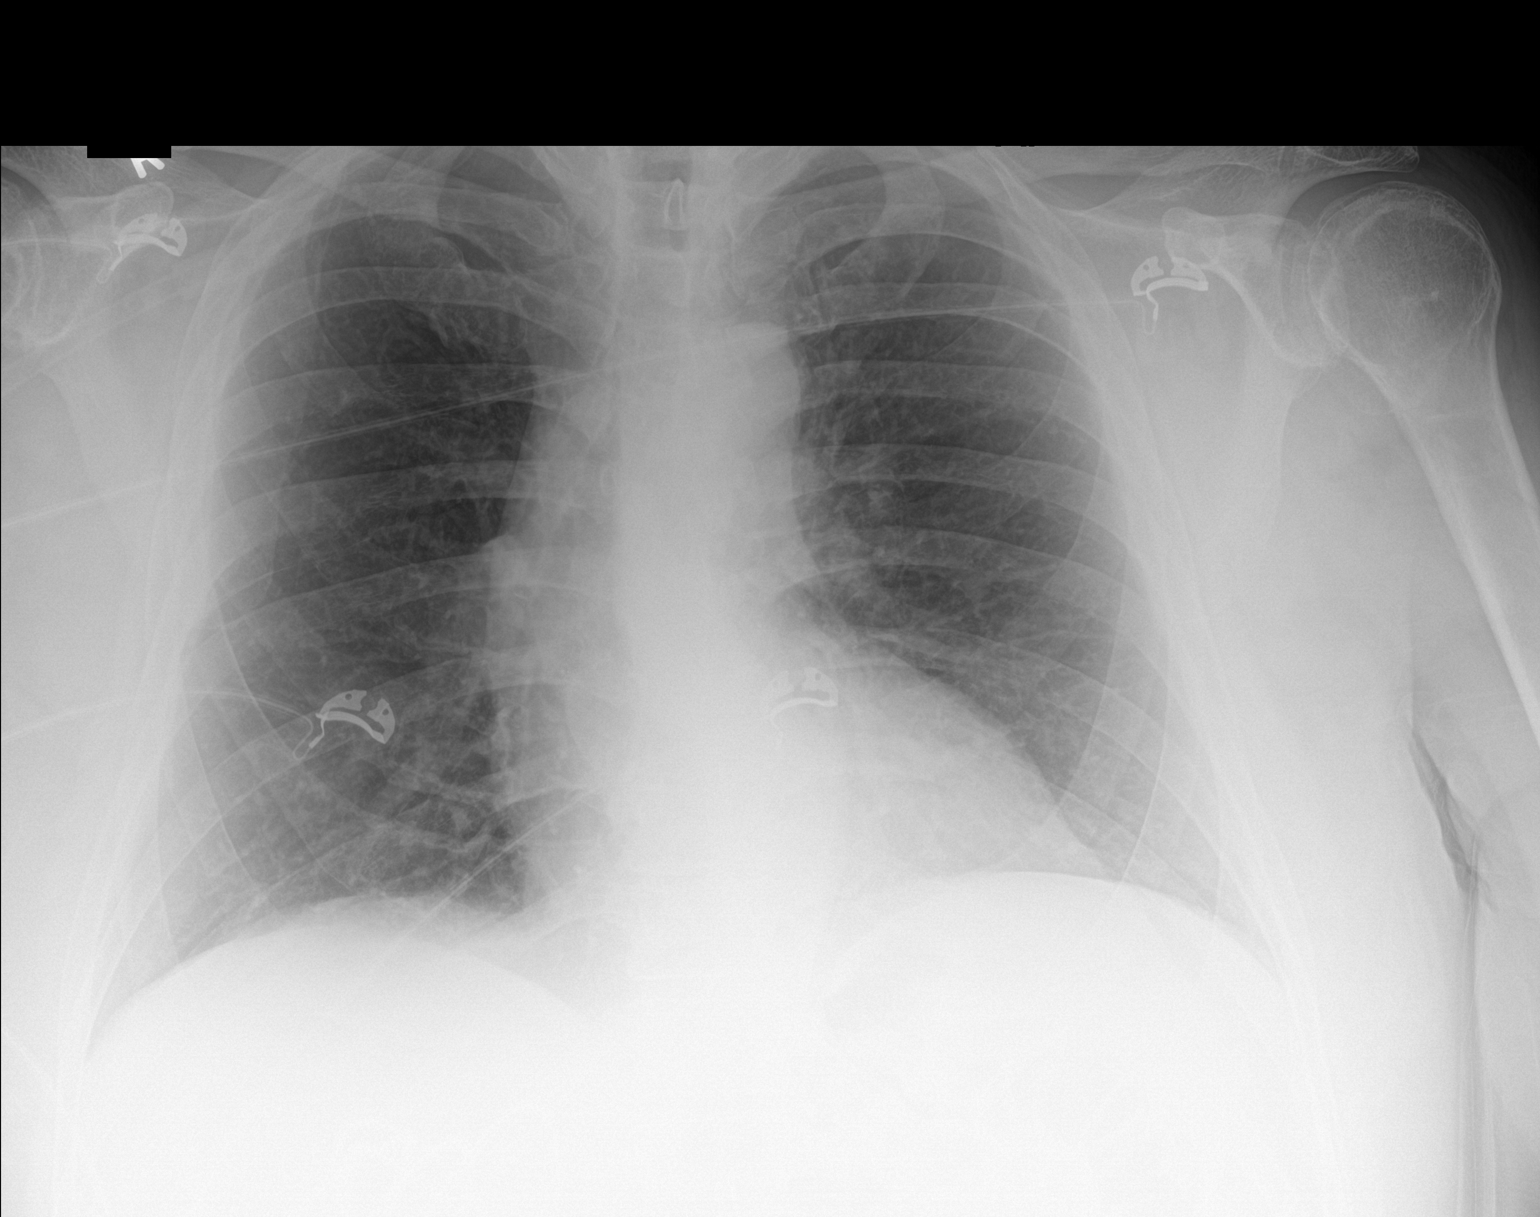

[1 of 1 positions shown; findings below may reference images not displayed]

FINDINGS: Heart and mediastinal contours are within normal limits. No focal
opacities or effusions. No acute bony abnormality.
IMPRESSION: Normal study.

## 2022-02-15 IMAGING — CT CT ANGIO CHEST
2 of 8 series · 18 of 36 positions shown · IV contrast (Omnipaque)
Comparison: None.

CLINICAL DATA: Shortness of breath, low O2 sats

EXAM:
CT ANGIOGRAPHY CHEST WITH CONTRAST
TECHNIQUE: Multidetector CT imaging of the chest was performed using the
standard protocol during bolus administration of intravenous
contrast. Multiplanar CT image reconstructions and MIPs were
obtained to evaluate the vascular anatomy.
CONTRAST:  78mL OMNIPAQUE IOHEXOL 350 MG/ML SOLN

[Series 6: pe thins · axial · 0.90mm/px · z∈[+814,+1083]mm · 17 of 301 slices shown]
[im 16/301  lung]
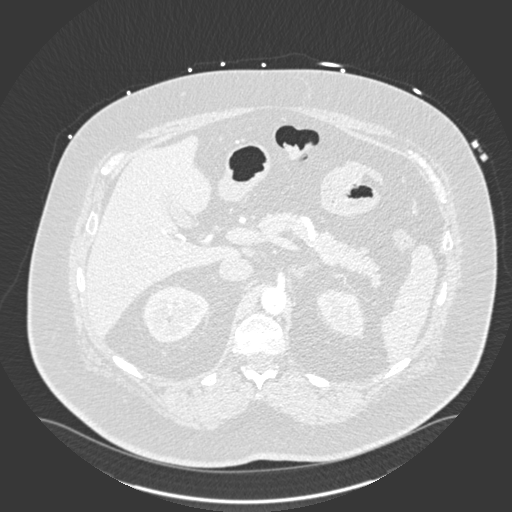
[im 32/301  mediastinal]
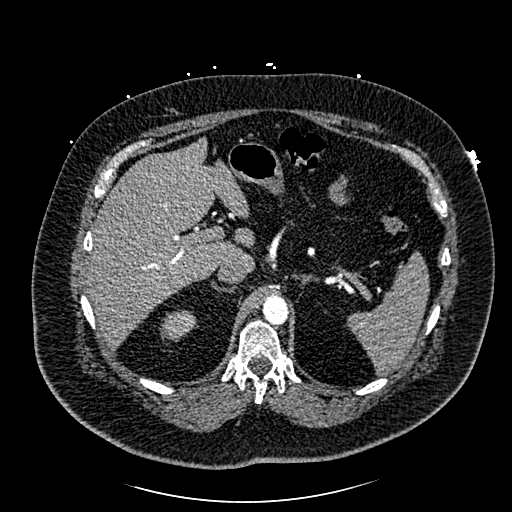
[im 48/301  lung]
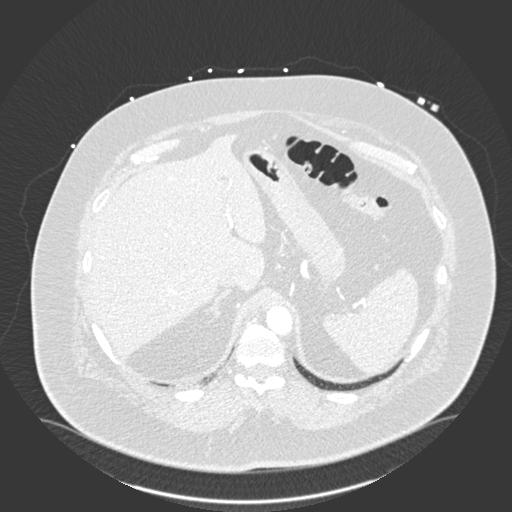
[im 64/301  mediastinal]
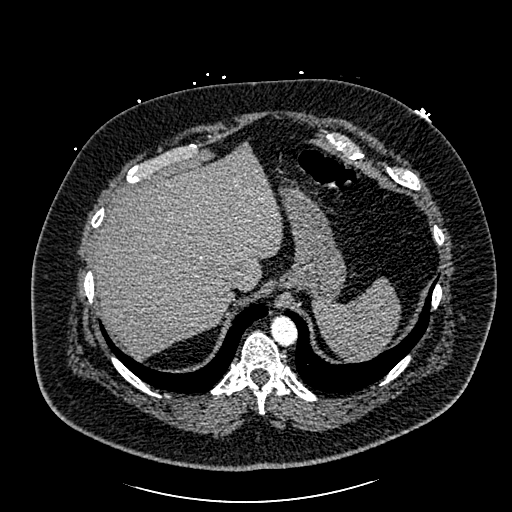
[im 79/301  lung]
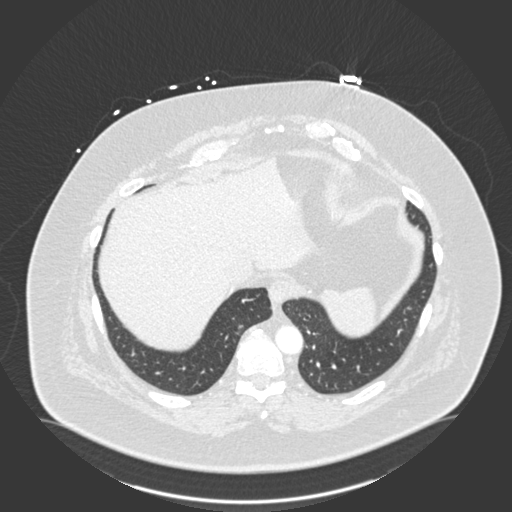
[im 95/301  mediastinal]
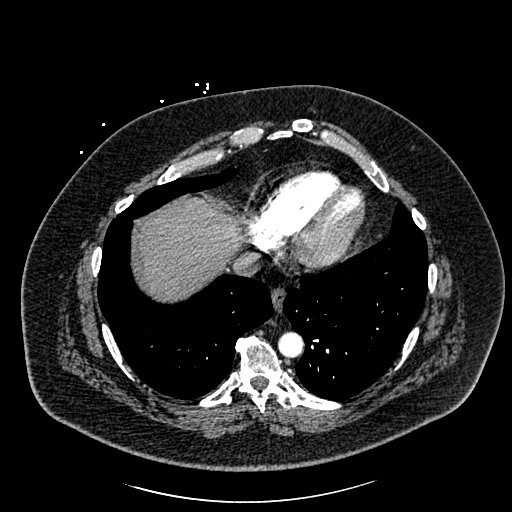
[im 111/301  lung]
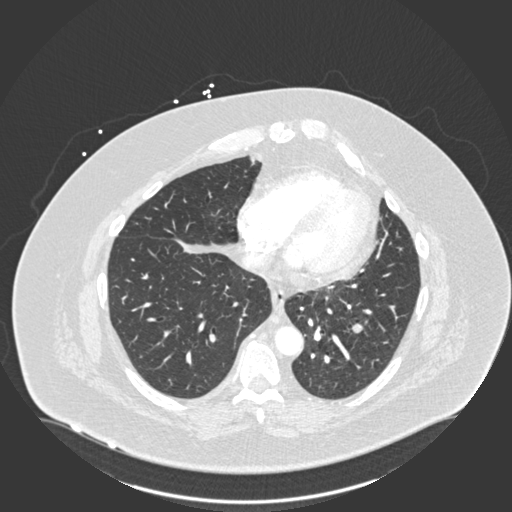
[im 127/301  mediastinal]
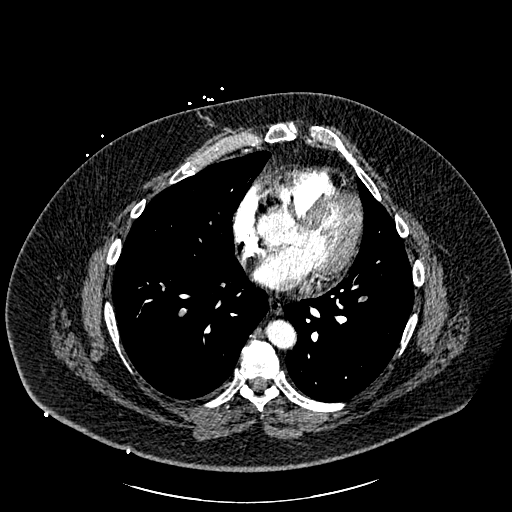
[im 158/301  lung]
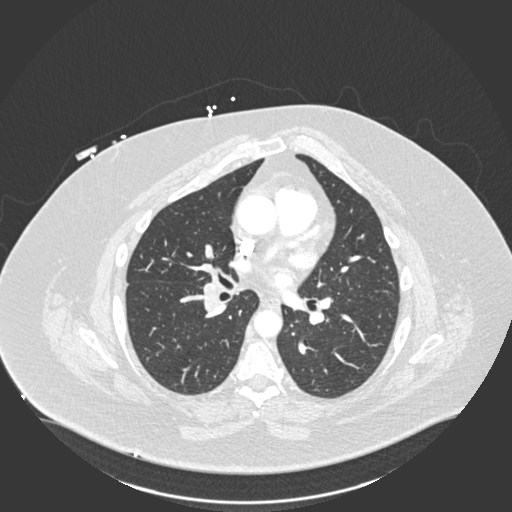
[im 174/301  mediastinal]
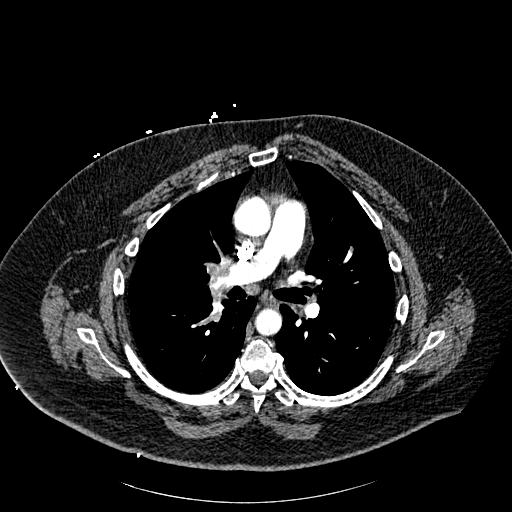
[im 190/301  lung]
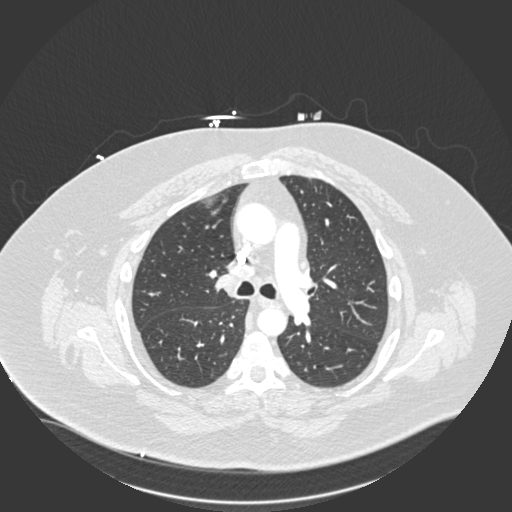
[im 206/301  mediastinal]
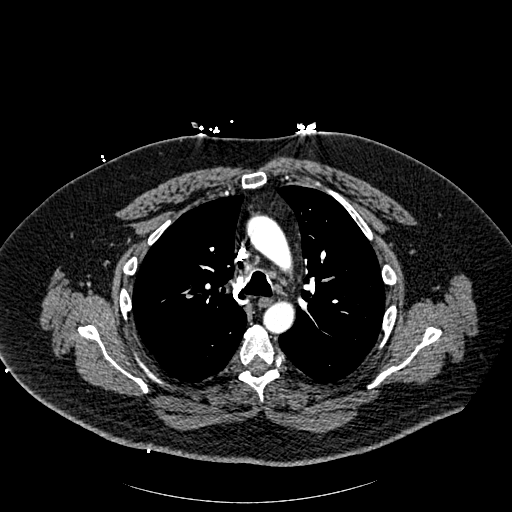
[im 222/301  lung]
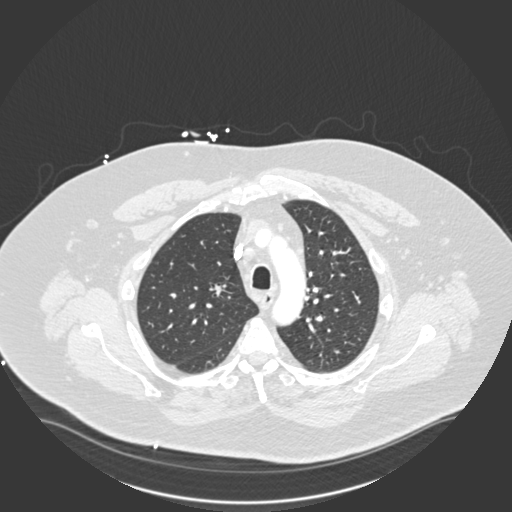
[im 237/301  mediastinal]
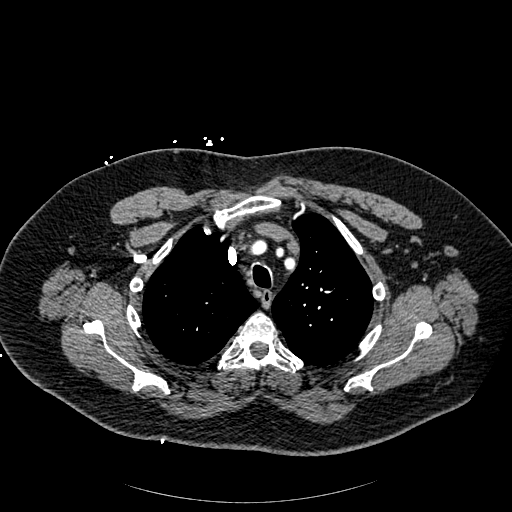
[im 253/301  lung]
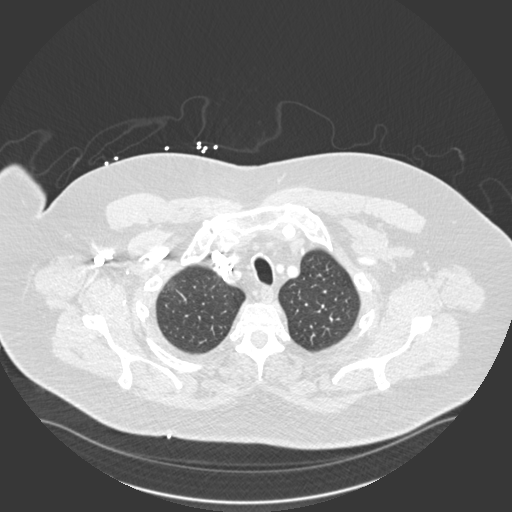
[im 269/301  mediastinal]
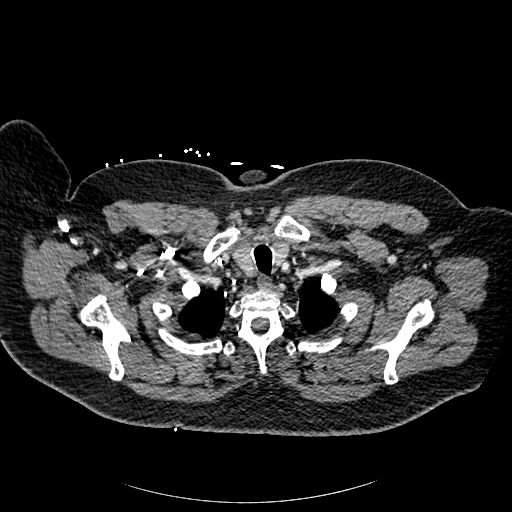
[im 285/301  lung]
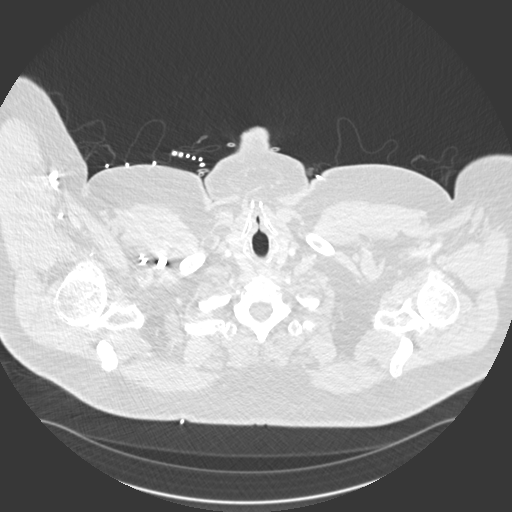

[Series 7: pe coronal mpr · coronal · 0.57mm/px · 1 of 151 slices shown]
[im 76/151  mediastinal]
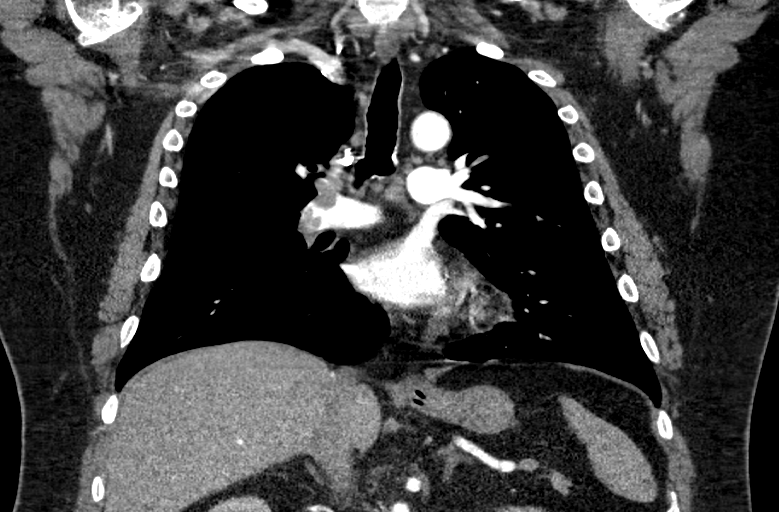

[18 of 36 positions shown; findings below may reference images not displayed]

FINDINGS: Cardiovascular: Several pulmonary emboli are seen within the right
pulmonary arteries involving all lobes. Small pulmonary emboli
within a left lower lobe pulmonary arterial subsegmental branch. No
evidence of right heart strain. Heart is normal size. Aorta is
normal caliber.

Mediastinum/Nodes: No mediastinal, hilar, or axillary adenopathy.
Trachea and esophagus are unremarkable. Thyroid unremarkable.

Lungs/Pleura: Lungs are clear. No focal airspace opacities or
suspicious nodules. No effusions.

Upper Abdomen: Imaging into the upper abdomen shows no acute
findings.

Musculoskeletal: Chest wall soft tissues are unremarkable. No acute
bony abnormality.

Review of the MIP images confirms the above findings.
IMPRESSION: Multiple right pulmonary emboli within the central pulmonary
arteries and branches in all lobes of the right lung. Small
subsegmental left lower lobe pulmonary embolus. No evidence of right
heart strain.

Critical Value/emergent results were called by telephone at the time
of interpretation on 12/29/2019 at [DATE] to provider JENINE MEHROTRA
, who verbally acknowledged these results.

## 2022-02-24 ENCOUNTER — Other Ambulatory Visit (HOSPITAL_BASED_OUTPATIENT_CLINIC_OR_DEPARTMENT_OTHER): Payer: Self-pay

## 2022-03-31 ENCOUNTER — Other Ambulatory Visit: Payer: Self-pay | Admitting: Family Medicine

## 2022-03-31 ENCOUNTER — Other Ambulatory Visit (HOSPITAL_BASED_OUTPATIENT_CLINIC_OR_DEPARTMENT_OTHER): Payer: Self-pay

## 2022-03-31 DIAGNOSIS — F32A Depression, unspecified: Secondary | ICD-10-CM

## 2022-03-31 MED ORDER — ESCITALOPRAM OXALATE 20 MG PO TABS
20.0000 mg | ORAL_TABLET | Freq: Every day | ORAL | 11 refills | Status: DC
  Filled 2022-03-31: qty 30, 30d supply, fill #0
  Filled 2022-05-11: qty 30, 30d supply, fill #1
  Filled 2022-06-10: qty 30, 30d supply, fill #2
  Filled 2022-07-17: qty 30, 30d supply, fill #3
  Filled 2022-08-06 – 2022-08-22 (×2): qty 30, 30d supply, fill #4
  Filled 2022-09-17: qty 30, 30d supply, fill #5
  Filled 2022-10-29: qty 30, 30d supply, fill #6
  Filled 2022-11-21 – 2022-11-26 (×3): qty 30, 30d supply, fill #7
  Filled 2022-12-28: qty 30, 30d supply, fill #8
  Filled 2023-01-27: qty 30, 30d supply, fill #9
  Filled 2023-03-10: qty 30, 30d supply, fill #10

## 2022-05-11 ENCOUNTER — Other Ambulatory Visit (HOSPITAL_BASED_OUTPATIENT_CLINIC_OR_DEPARTMENT_OTHER): Payer: Self-pay

## 2022-05-27 ENCOUNTER — Other Ambulatory Visit (HOSPITAL_BASED_OUTPATIENT_CLINIC_OR_DEPARTMENT_OTHER): Payer: Self-pay

## 2022-06-04 ENCOUNTER — Ambulatory Visit: Payer: Self-pay | Admitting: Family Medicine

## 2022-06-04 ENCOUNTER — Encounter: Payer: Self-pay | Admitting: Family Medicine

## 2022-06-04 VITALS — BP 124/67 | HR 55 | Ht 69.0 in | Wt 261.0 lb

## 2022-06-04 DIAGNOSIS — I2699 Other pulmonary embolism without acute cor pulmonale: Secondary | ICD-10-CM

## 2022-06-04 DIAGNOSIS — I10 Essential (primary) hypertension: Secondary | ICD-10-CM

## 2022-06-04 DIAGNOSIS — F322 Major depressive disorder, single episode, severe without psychotic features: Secondary | ICD-10-CM

## 2022-06-04 DIAGNOSIS — F329 Major depressive disorder, single episode, unspecified: Secondary | ICD-10-CM

## 2022-06-04 DIAGNOSIS — Z23 Encounter for immunization: Secondary | ICD-10-CM

## 2022-06-04 DIAGNOSIS — F1029 Alcohol dependence with unspecified alcohol-induced disorder: Secondary | ICD-10-CM

## 2022-06-04 NOTE — Assessment & Plan Note (Signed)
Pressure looks great.

## 2022-06-04 NOTE — Assessment & Plan Note (Signed)
See note below

## 2022-06-04 NOTE — Assessment & Plan Note (Signed)
Really off of Eliquis because of cost.  We did check good Rx but it still over $500 a month.  Encouraged him to apply for patient assistance.  He is taking an aspirin daily instead.

## 2022-06-04 NOTE — Assessment & Plan Note (Signed)
Currently off of Rexulti.  But he feels like his mood is stable.  We did discuss the option of Abilify as it is $8 at [facility] if we do need to start back a mood stabilizer.  He will let me know.

## 2022-06-04 NOTE — Assessment & Plan Note (Signed)
Now he is abstinent and in remission.  Continue to monitor.

## 2022-06-04 NOTE — Progress Notes (Signed)
Established Patient Office Visit  Subjective   Patient ID: Jackson Sherman, male    DOB: 04-11-1961  Age: 61 y.o. MRN: [id number]  Chief Complaint  Patient presents with   Hypertension    HPI Hypertension- Pt denies chest pain, SOB, dizziness, or heart palpitations.  Taking meds as directed w/o problems.  Denies medication side effects.    Unipolar depression-unfortunately he has not been able to afford [place].  So he has been off of it but he actually feels like his mood is doing well and stable.  He has remained abstinent.    ROS    Objective:     BP 124/67   Pulse (!) 55   Ht '5\' 9"'$  (1.753 m)   Wt 261 lb (118.4 kg)   SpO2 96%   BMI 38.54 kg/m    Physical Exam Constitutional:      Appearance: He is well-developed.  HENT:     Head: Normocephalic and atraumatic.  Cardiovascular:     Rate and Rhythm: Normal rate and regular rhythm.     Heart sounds: Normal heart sounds.  Pulmonary:     Effort: Pulmonary effort is normal.     Breath sounds: Normal breath sounds.  Skin:    General: Skin is warm and dry.  Neurological:     Mental Status: He is alert and oriented to person, place, and time.  Psychiatric:        Behavior: Behavior normal.      No results found for any visits on 06/04/22.    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  We did check good Rx but it still over $500 a month.  Encouraged him to apply for patient assistance.  He is taking an aspirin daily instead.      Essential hypertension    Pressure looks great.      Relevant Orders   BASIC METABOLIC PANEL WITH GFR     Other   Unipolar depression    Currently off of Rexulti.  But he feels like his mood is stable.  We did discuss the option of Abilify as it is $8 at [facility]  if we do need to start back a mood stabilizer.  He will let me know.      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      Alcohol dependence ([place])    Now he is abstinent and in remission.  Continue to monitor.      Other Visit Diagnoses     Need for immunization against influenza    -  Primary   Relevant Orders   Flu Vaccine QUAD 53moIM (Fluarix, Fluzone & Alfiuria Quad PF) (Completed)   Encounter for immunization       Relevant Orders   Pfizer Fall 2023 Covid-19 Vaccine 145yrand older (Completed)       May want to also look on good Rx for some of his other medications such as his escitalopram and rosuvastatin to see if it might be more cost effective to use the app.  Return in about 6 months (around 12/04/2022) for Hypertension.    CaBeatrice LecherMD

## 2022-06-04 NOTE — Progress Notes (Signed)
Pt reports that he has not been taking the rexulti 2 mg or the Eliquis 5 mg due to cost.

## 2022-06-05 LAB — BASIC METABOLIC PANEL WITH GFR
BUN: 20 mg/dL (ref 7–25)
CO2: 25 mmol/L (ref 20–32)
Calcium: 9 mg/dL (ref 8.6–10.3)
Chloride: 102 mmol/L (ref 98–110)
Creat: 1.06 mg/dL (ref 0.70–1.35)
Glucose, Bld: 93 mg/dL (ref 65–99)
Potassium: 4.7 mmol/L (ref 3.5–5.3)
Sodium: 137 mmol/L (ref 135–146)
eGFR: 79 mL/min/{1.73_m2} (ref >=60)

## 2022-06-05 NOTE — Progress Notes (Signed)
Your lab work is within acceptable range and there are no concerning findings.   ?

## 2022-06-10 ENCOUNTER — Other Ambulatory Visit: Payer: Self-pay | Admitting: Family Medicine

## 2022-06-10 ENCOUNTER — Other Ambulatory Visit (HOSPITAL_BASED_OUTPATIENT_CLINIC_OR_DEPARTMENT_OTHER): Payer: Self-pay

## 2022-06-10 DIAGNOSIS — F32A Depression, unspecified: Secondary | ICD-10-CM

## 2022-06-10 MED ORDER — BUSPIRONE HCL 15 MG PO TABS
15.0000 mg | ORAL_TABLET | Freq: Two times a day (BID) | ORAL | 1 refills | Status: DC
  Filled 2022-06-10: qty 180, 90d supply, fill #0
  Filled 2022-08-22 – 2022-09-17 (×2): qty 180, 90d supply, fill #1

## 2022-07-17 ENCOUNTER — Other Ambulatory Visit (HOSPITAL_BASED_OUTPATIENT_CLINIC_OR_DEPARTMENT_OTHER): Payer: Self-pay

## 2022-07-24 ENCOUNTER — Other Ambulatory Visit (HOSPITAL_BASED_OUTPATIENT_CLINIC_OR_DEPARTMENT_OTHER): Payer: Self-pay

## 2022-08-06 ENCOUNTER — Other Ambulatory Visit: Payer: Self-pay | Admitting: Family Medicine

## 2022-08-06 ENCOUNTER — Other Ambulatory Visit (HOSPITAL_BASED_OUTPATIENT_CLINIC_OR_DEPARTMENT_OTHER): Payer: Self-pay

## 2022-08-06 ENCOUNTER — Other Ambulatory Visit: Payer: Self-pay

## 2022-08-06 DIAGNOSIS — I10 Essential (primary) hypertension: Secondary | ICD-10-CM

## 2022-08-06 MED ORDER — METOPROLOL TARTRATE 25 MG PO TABS
12.5000 mg | ORAL_TABLET | Freq: Two times a day (BID) | ORAL | 3 refills | Status: DC
  Filled 2022-08-06: qty 90, 90d supply, fill #0
  Filled 2022-10-29: qty 90, 90d supply, fill #1
  Filled 2023-02-22: qty 90, 90d supply, fill #2
  Filled 2023-05-27: qty 90, 90d supply, fill #3

## 2022-08-06 MED ORDER — LOSARTAN POTASSIUM 50 MG PO TABS
50.0000 mg | ORAL_TABLET | Freq: Every day | ORAL | 1 refills | Status: DC
  Filled 2022-08-06: qty 90, 90d supply, fill #0
  Filled 2022-10-29: qty 90, 90d supply, fill #1

## 2022-08-24 ENCOUNTER — Other Ambulatory Visit: Payer: Self-pay

## 2022-08-24 ENCOUNTER — Other Ambulatory Visit (HOSPITAL_BASED_OUTPATIENT_CLINIC_OR_DEPARTMENT_OTHER): Payer: Self-pay

## 2022-08-25 ENCOUNTER — Other Ambulatory Visit (HOSPITAL_BASED_OUTPATIENT_CLINIC_OR_DEPARTMENT_OTHER): Payer: Self-pay

## 2022-09-17 ENCOUNTER — Other Ambulatory Visit: Payer: Self-pay

## 2022-09-17 ENCOUNTER — Other Ambulatory Visit (HOSPITAL_BASED_OUTPATIENT_CLINIC_OR_DEPARTMENT_OTHER): Payer: Self-pay

## 2022-10-29 ENCOUNTER — Other Ambulatory Visit (HOSPITAL_BASED_OUTPATIENT_CLINIC_OR_DEPARTMENT_OTHER): Payer: Self-pay

## 2022-11-21 ENCOUNTER — Other Ambulatory Visit: Payer: Self-pay | Admitting: Family Medicine

## 2022-11-23 ENCOUNTER — Other Ambulatory Visit: Payer: Self-pay

## 2022-11-24 ENCOUNTER — Other Ambulatory Visit (HOSPITAL_BASED_OUTPATIENT_CLINIC_OR_DEPARTMENT_OTHER): Payer: Self-pay

## 2022-11-25 ENCOUNTER — Other Ambulatory Visit (HOSPITAL_BASED_OUTPATIENT_CLINIC_OR_DEPARTMENT_OTHER): Payer: Self-pay

## 2022-11-25 MED ORDER — ROSUVASTATIN CALCIUM 10 MG PO TABS
10.0000 mg | ORAL_TABLET | Freq: Every day | ORAL | 3 refills | Status: DC
  Filled 2022-11-25: qty 90, 90d supply, fill #0
  Filled 2023-02-22: qty 90, 90d supply, fill #1
  Filled 2023-05-27: qty 90, 90d supply, fill #2
  Filled 2023-08-21: qty 90, 90d supply, fill #3

## 2022-11-26 ENCOUNTER — Other Ambulatory Visit: Payer: Self-pay

## 2022-11-26 ENCOUNTER — Other Ambulatory Visit (HOSPITAL_BASED_OUTPATIENT_CLINIC_OR_DEPARTMENT_OTHER): Payer: Self-pay

## 2022-11-30 ENCOUNTER — Ambulatory Visit (INDEPENDENT_AMBULATORY_CARE_PROVIDER_SITE_OTHER): Payer: Self-pay | Admitting: Family Medicine

## 2022-11-30 ENCOUNTER — Encounter: Payer: Self-pay | Admitting: Family Medicine

## 2022-11-30 ENCOUNTER — Other Ambulatory Visit (HOSPITAL_BASED_OUTPATIENT_CLINIC_OR_DEPARTMENT_OTHER): Payer: Self-pay

## 2022-11-30 VITALS — BP 125/71 | HR 55 | Ht 69.0 in | Wt 279.0 lb

## 2022-11-30 DIAGNOSIS — E88819 Insulin resistance, unspecified: Secondary | ICD-10-CM

## 2022-11-30 DIAGNOSIS — B351 Tinea unguium: Secondary | ICD-10-CM

## 2022-11-30 DIAGNOSIS — L570 Actinic keratosis: Secondary | ICD-10-CM

## 2022-11-30 DIAGNOSIS — I10 Essential (primary) hypertension: Secondary | ICD-10-CM

## 2022-11-30 MED ORDER — TERBINAFINE HCL 250 MG PO TABS
250.0000 mg | ORAL_TABLET | Freq: Every day | ORAL | 1 refills | Status: DC
Start: 2022-11-30 — End: 2024-03-02
  Filled 2022-11-30: qty 90, 90d supply, fill #0
  Filled 2023-02-22: qty 90, 90d supply, fill #1

## 2022-11-30 NOTE — Progress Notes (Signed)
Established Patient Office Visit  Subjective   Patient ID: Jackson Sherman, male    DOB: [birth year]  Age: 62 y.o. MRN: [id number]  Chief Complaint  Patient presents with   Hypertension   mood    HPI   Hypertension- Pt denies chest pain, SOB, dizziness, or heart palpitations.  Taking meds as directed w/o problems.  Denies medication side effects.    Impaired fasting glucose-no increased thirst or urination. No symptoms consistent with hypoglycemia.  Also wanted me to look at his right great toenail today.  He noticed that it has been discolored he has been using an over-the-counter topical it is a little better than it was.  He also has a very scaly lesion on the top of the scalp that is raised.  It has been there for maybe about 6 months.  He does not feel like it is necessarily gotten any larger.     ROS    Objective:     BP 125/71   Pulse (!) 55   Ht 5\' 9"  (1.753 m)   Wt 279 lb (126.6 kg)   SpO2 96%   BMI 41.20 kg/m     Physical Exam Constitutional:      Appearance: He is well-developed.  HENT:     Head: Normocephalic and atraumatic.  Cardiovascular:     Rate and Rhythm: Normal rate and regular rhythm.     Heart sounds: Normal heart sounds.  Pulmonary:     Effort: Pulmonary effort is normal.     Breath sounds: Normal breath sounds.  Skin:    General: Skin is warm and dry.  Neurological:     Mental Status: He is alert and oriented to person, place, and time.  Psychiatric:        Behavior: Behavior normal.      No results found for any visits on 11/30/22.     The 10-year ASCVD risk score (Arnett DK, et al., 2019) is: 11.5%    Assessment & Plan:   Problem List Items Addressed This Visit       Cardiovascular and Mediastinum   Essential hypertension - Primary   Relevant Orders   Lipid Panel w/reflex Direct LDL   CBC   COMPLETE METABOLIC PANEL WITH GFR   HgB W0J     Endocrine   Insulin resistance   Relevant Orders   Lipid Panel  w/reflex Direct LDL   CBC   COMPLETE METABOLIC PANEL WITH GFR   HgB W1X   Other Visit Diagnoses     Onychomycosis of right great toe       Relevant Medications   terbinafine (LAMISIL) 250 MG tablet   Actinic keratoses           Onychomycosis-we discussed oral terbinafine.  Will need to check liver function before he starts the medication though.  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  Actinic keratoses-recommend treatment with cryotherapy if they return then recommend shave biopsy for further workup.  Cryotherapy Procedure Note  Pre-operative Diagnosis: Actinic keratosis  Post-operative Diagnosis: Actinic keratosis  Locations: top of head and right ear.   Indications: irritation   Anesthesia: none   Procedure Details  Patient informed of risks (permanent scarring, infection, light or dark discoloration, bleeding, infection, weakness, numbness and recurrence of the lesion) and benefits of the procedure and verbal informed consent obtained.  The areas are treated with liquid nitrogen therapy, frozen until ice ball extended 1-2 mm beyond lesion,  allowed to thaw, and treated again. The patient tolerated procedure well.  The patient was instructed on post-op care, warned that there may be blister formation, redness and pain. Recommend OTC analgesia as needed for pain.  Condition: Stable  Complications: none.  Plan: 1. Instructed to keep the area dry and covered for 24-48h and clean thereafter. 2. Warning signs of infection were reviewed.   3. Recommended that the patient use OTC acetaminophen as needed for pain.  4. Return PRN.    No follow-ups on file.    Nani Gasser, MD

## 2022-12-01 LAB — LIPID PANEL W/REFLEX DIRECT LDL
Cholesterol: 170 mg/dL (ref <200)
HDL: 52 mg/dL (ref >=40)
LDL Cholesterol (Calc): 99 mg/dL (calc)
Non-HDL Cholesterol (Calc): 118 mg/dL (calc) (ref <130)
Total CHOL/HDL Ratio: 3.3 (calc) (ref <5.0)
Triglycerides: 94 mg/dL (ref <150)

## 2022-12-01 LAB — CBC
HCT: 45.4 % (ref 38.5–50.0)
Hemoglobin: 14.8 g/dL (ref 13.2–17.1)
MCH: 28.4 pg (ref 27.0–33.0)
MCHC: 32.6 g/dL (ref 32.0–36.0)
MCV: 87.1 fL (ref 80.0–100.0)
MPV: 11.9 fL (ref 7.5–12.5)
Platelets: 335 10*3/uL (ref 140–400)
RBC: 5.21 10*6/uL (ref 4.20–5.80)
RDW: 13.1 % (ref 11.0–15.0)
WBC: 11.4 10*3/uL — ABNORMAL HIGH (ref 3.8–10.8)

## 2022-12-01 LAB — COMPLETE METABOLIC PANEL WITH GFR
AG Ratio: 1.3 (calc) (ref 1.0–2.5)
ALT: 18 U/L (ref 9–46)
AST: 16 U/L (ref 10–35)
Albumin: 3.8 g/dL (ref 3.6–5.1)
Alkaline phosphatase (APISO): 70 U/L (ref 35–144)
BUN: 18 mg/dL (ref 7–25)
CO2: 28 mmol/L (ref 20–32)
Calcium: 8.5 mg/dL — ABNORMAL LOW (ref 8.6–10.3)
Chloride: 103 mmol/L (ref 98–110)
Creat: 0.96 mg/dL (ref 0.70–1.35)
Globulin: 3 g/dL (calc) (ref 1.9–3.7)
Glucose, Bld: 103 mg/dL — ABNORMAL HIGH (ref 65–99)
Potassium: 5.2 mmol/L (ref 3.5–5.3)
Sodium: 139 mmol/L (ref 135–146)
Total Bilirubin: 0.4 mg/dL (ref 0.2–1.2)
Total Protein: 6.8 g/dL (ref 6.1–8.1)
eGFR: 89 mL/min/{1.73_m2} (ref >=60)

## 2022-12-01 LAB — HEMOGLOBIN A1C
Hgb A1c MFr Bld: 6.4 % of total Hgb — ABNORMAL HIGH (ref <5.7)
Mean Plasma Glucose: 137 mg/dL
eAG (mmol/L): 7.6 mmol/L

## 2022-12-01 NOTE — Progress Notes (Signed)
Hi Phil, your blood count is stable.  Your white blood cells are just a little borderline low but I think this is your baseline.  Your calcium is low again so I would encourage you to try to increase your calcium in your diet to your body absorbs it that way the best.  But you can also start an over-the-counter calcium supplement if you would like.  Hemoglobin A1c has jumped up to 6.4.  Still in the prediabetes range but borderline for full-blown diabetes.  So really encouraged her to work on cutting back on sweets and carbs and lets plan to recheck your A1c again in about 4 months.  Cholesterol is mildly elevated.  Just make sure you are taking your Crestor daily and again continue to work on healthy diet and regular exercise.

## 2022-12-07 ENCOUNTER — Ambulatory Visit: Payer: Self-pay | Admitting: Family Medicine

## 2022-12-21 ENCOUNTER — Other Ambulatory Visit: Payer: Self-pay | Admitting: Family Medicine

## 2022-12-21 ENCOUNTER — Other Ambulatory Visit (HOSPITAL_BASED_OUTPATIENT_CLINIC_OR_DEPARTMENT_OTHER): Payer: Self-pay

## 2022-12-21 DIAGNOSIS — F32A Depression, unspecified: Secondary | ICD-10-CM

## 2022-12-21 MED ORDER — BUSPIRONE HCL 15 MG PO TABS
15.0000 mg | ORAL_TABLET | Freq: Two times a day (BID) | ORAL | 1 refills | Status: DC
Start: 2022-12-21 — End: 2023-07-12
  Filled 2022-12-21: qty 180, 90d supply, fill #0
  Filled 2023-03-10: qty 180, 90d supply, fill #1

## 2022-12-28 ENCOUNTER — Other Ambulatory Visit (HOSPITAL_BASED_OUTPATIENT_CLINIC_OR_DEPARTMENT_OTHER): Payer: Self-pay

## 2023-01-27 ENCOUNTER — Other Ambulatory Visit: Payer: Self-pay

## 2023-01-31 ENCOUNTER — Other Ambulatory Visit: Payer: Self-pay | Admitting: Family Medicine

## 2023-02-01 ENCOUNTER — Other Ambulatory Visit (HOSPITAL_BASED_OUTPATIENT_CLINIC_OR_DEPARTMENT_OTHER): Payer: Self-pay

## 2023-02-01 MED ORDER — LOSARTAN POTASSIUM 50 MG PO TABS
50.0000 mg | ORAL_TABLET | Freq: Every day | ORAL | 1 refills | Status: DC
  Filled 2023-02-01: qty 90, 90d supply, fill #0
  Filled 2023-05-11: qty 90, 90d supply, fill #1

## 2023-02-23 ENCOUNTER — Other Ambulatory Visit (HOSPITAL_BASED_OUTPATIENT_CLINIC_OR_DEPARTMENT_OTHER): Payer: Self-pay

## 2023-03-04 ENCOUNTER — Other Ambulatory Visit (HOSPITAL_BASED_OUTPATIENT_CLINIC_OR_DEPARTMENT_OTHER): Payer: Self-pay

## 2023-03-04 MED ORDER — IBUPROFEN 600 MG PO TABS
600.0000 mg | ORAL_TABLET | Freq: Four times a day (QID) | ORAL | 0 refills | Status: DC | PRN
  Filled 2023-03-04: qty 20, 5d supply, fill #0

## 2023-03-04 MED ORDER — AMOXICILLIN 875 MG PO TABS
875.0000 mg | ORAL_TABLET | Freq: Two times a day (BID) | ORAL | 0 refills | Status: DC
  Filled 2023-03-04: qty 10, 5d supply, fill #0

## 2023-03-10 ENCOUNTER — Other Ambulatory Visit (HOSPITAL_BASED_OUTPATIENT_CLINIC_OR_DEPARTMENT_OTHER): Payer: Self-pay

## 2023-03-10 MED ORDER — CHLORHEXIDINE GLUCONATE 0.12 % MT SOLN
15.0000 mL | OROMUCOSAL | 0 refills | Status: DC
  Filled 2023-03-10: qty 473, 30d supply, fill #0

## 2023-04-08 ENCOUNTER — Other Ambulatory Visit: Payer: Self-pay | Admitting: Family Medicine

## 2023-04-08 DIAGNOSIS — F32A Depression, unspecified: Secondary | ICD-10-CM

## 2023-04-09 ENCOUNTER — Other Ambulatory Visit (HOSPITAL_BASED_OUTPATIENT_CLINIC_OR_DEPARTMENT_OTHER): Payer: Self-pay

## 2023-04-09 MED ORDER — ESCITALOPRAM OXALATE 20 MG PO TABS
20.0000 mg | ORAL_TABLET | Freq: Every day | ORAL | 11 refills | Status: DC
Start: 2023-04-09 — End: 2024-04-10
  Filled 2023-04-09: qty 30, 30d supply, fill #0
  Filled 2023-05-11: qty 30, 30d supply, fill #1
  Filled 2023-06-13: qty 30, 30d supply, fill #2
  Filled 2023-07-12: qty 30, 30d supply, fill #3
  Filled 2023-08-16: qty 30, 30d supply, fill #4
  Filled 2023-09-17: qty 30, 30d supply, fill #5
  Filled 2023-10-17: qty 30, 30d supply, fill #6
  Filled 2023-12-19: qty 30, 30d supply, fill #7
  Filled 2024-01-28: qty 30, 30d supply, fill #8
  Filled 2024-03-01: qty 30, 30d supply, fill #9

## 2023-04-28 ENCOUNTER — Other Ambulatory Visit (HOSPITAL_BASED_OUTPATIENT_CLINIC_OR_DEPARTMENT_OTHER): Payer: Self-pay

## 2023-05-04 ENCOUNTER — Ambulatory Visit (INDEPENDENT_AMBULATORY_CARE_PROVIDER_SITE_OTHER): Payer: Self-pay

## 2023-05-04 DIAGNOSIS — Z23 Encounter for immunization: Secondary | ICD-10-CM

## 2023-05-11 ENCOUNTER — Other Ambulatory Visit (HOSPITAL_BASED_OUTPATIENT_CLINIC_OR_DEPARTMENT_OTHER): Payer: Self-pay

## 2023-05-28 ENCOUNTER — Other Ambulatory Visit (HOSPITAL_BASED_OUTPATIENT_CLINIC_OR_DEPARTMENT_OTHER): Payer: Self-pay

## 2023-06-01 ENCOUNTER — Other Ambulatory Visit (HOSPITAL_BASED_OUTPATIENT_CLINIC_OR_DEPARTMENT_OTHER): Payer: Self-pay

## 2023-06-01 ENCOUNTER — Encounter: Payer: Self-pay | Admitting: Family Medicine

## 2023-06-01 ENCOUNTER — Ambulatory Visit (INDEPENDENT_AMBULATORY_CARE_PROVIDER_SITE_OTHER): Payer: Self-pay | Admitting: Family Medicine

## 2023-06-01 VITALS — BP 122/83 | HR 62 | Ht 69.0 in | Wt 299.0 lb

## 2023-06-01 DIAGNOSIS — F322 Major depressive disorder, single episode, severe without psychotic features: Secondary | ICD-10-CM

## 2023-06-01 DIAGNOSIS — R058 Other specified cough: Secondary | ICD-10-CM

## 2023-06-01 DIAGNOSIS — I10 Essential (primary) hypertension: Secondary | ICD-10-CM

## 2023-06-01 DIAGNOSIS — Z125 Encounter for screening for malignant neoplasm of prostate: Secondary | ICD-10-CM

## 2023-06-01 DIAGNOSIS — E88819 Insulin resistance, unspecified: Secondary | ICD-10-CM

## 2023-06-01 LAB — POCT GLYCOSYLATED HEMOGLOBIN (HGB A1C): Hemoglobin A1C: 6.4 % — AB (ref 4.0–5.6)

## 2023-06-01 MED ORDER — METFORMIN HCL ER 500 MG PO TB24
500.0000 mg | ORAL_TABLET | Freq: Every day | ORAL | 1 refills | Status: DC
  Filled 2023-06-01: qty 90, 90d supply, fill #0
  Filled 2023-08-31: qty 90, 90d supply, fill #1

## 2023-06-01 NOTE — Progress Notes (Signed)
Established Patient Office Visit  Subjective   Patient ID: Jackson Sherman, male    DOB: [birth year]  Age: 62 y.o. MRN: [id number]  Chief Complaint  Patient presents with   Medical Management of Chronic Issues   Cough    Pt reports that his cough has been keeping him up at night he has been taking mucinex during the day.     HPI  Cough x 2 weeks that is lingering after recent viral illness. Sleeping upright at night to help.  Mucinex not helping.  Was not too bad during the day and he otherwise feels well.  Hypertension- Pt denies chest pain, SOB, dizziness, or heart palpitations.  Taking meds as directed w/o problems.  Denies medication side effects.    Impaired fasting glucose-no increased thirst or urination. No symptoms consistent with hypoglycemia.    ROS    Objective:     BP 122/83   Pulse 62   Ht 5\' 9"  (1.753 m)   Wt 299 lb (135.6 kg)   SpO2 96%   BMI 44.15 kg/m    Physical Exam Vitals and nursing note reviewed.  Constitutional:      Appearance: Normal appearance.  HENT:     Head: Normocephalic and atraumatic.  Eyes:     Conjunctiva/sclera: Conjunctivae normal.  Cardiovascular:     Rate and Rhythm: Normal rate and regular rhythm.  Pulmonary:     Effort: Pulmonary effort is normal.     Breath sounds: Normal breath sounds.  Skin:    General: Skin is warm and dry.  Neurological:     Mental Status: He is alert.  Psychiatric:        Mood and Affect: Mood normal.      Results for orders placed or performed in visit on 06/01/23  POCT HgB A1C  Result Value Ref Range   Hemoglobin A1C 6.4 (A) 4.0 - 5.6 %   HbA1c POC (<> result, manual entry)     HbA1c, POC (prediabetic range)     HbA1c, POC (controlled diabetic range)        The 10-year ASCVD risk score (Arnett DK, et al., 2019) is: 10.1%    Assessment & Plan:   Problem List Items Addressed This Visit       Cardiovascular and Mediastinum   Essential hypertension - Primary    Well  controlled. Continue current regimen. Follow up in  28mo       Relevant Orders   BMP8+EGFR     Endocrine   Insulin resistance    Borderline for Diabetes.  Starting metformin at this point continuing to work on [employer] cutting back on carbs and trying to reduce weight.  Follow up in 3-4 mo       Relevant Medications   metFORMIN (GLUCOPHAGE-XR) 500 MG 24 hr tablet   Other Relevant Orders   POCT HgB A1C (Completed)   BMP8+EGFR     Other   Current severe episode of major depressive disorder without psychotic features without prior episode (HCC)    Well overall on current regimen of Lexapro and buspirone.  Continue current regimen.      Other Visit Diagnoses     Post-viral cough syndrome       Screening for malignant neoplasm of prostate       Relevant Orders   PSA      Postviral cough-discussed Trelegy's to reduce cough including using cool-mist humidifier, keep throat well moisturized, using nasal saline to irrigate the  sinuses before bedtime, and using Flonase or Nasonex.  If not improving over this next week please let us know. \ Return in about 4 months (around 09/30/2023) for Diabetes follow-up.    Nani Gasser, MD

## 2023-06-01 NOTE — Assessment & Plan Note (Signed)
Well overall on current regimen of Lexapro and buspirone.  Continue current regimen.

## 2023-06-01 NOTE — Assessment & Plan Note (Addendum)
Borderline for Diabetes.  Starting metformin at this point continuing to work on [employer] cutting back on carbs and trying to reduce weight.  Follow up in 3-4 mo

## 2023-06-01 NOTE — Assessment & Plan Note (Signed)
Well controlled. Continue current regimen. Follow up in  6 mo  

## 2023-06-02 LAB — BMP8+EGFR
BUN/Creatinine Ratio: 20 (ref 10–24)
BUN: 22 mg/dL (ref 8–27)
CO2: 20 mmol/L (ref 20–29)
Calcium: 9 mg/dL (ref 8.6–10.2)
Chloride: 101 mmol/L (ref 96–106)
Creatinine, Ser: 1.11 mg/dL (ref 0.76–1.27)
Glucose: 123 mg/dL — ABNORMAL HIGH (ref 70–99)
Potassium: 4.8 mmol/L (ref 3.5–5.2)
Sodium: 136 mmol/L (ref 134–144)
eGFR: 75 mL/min/{1.73_m2} (ref >59)

## 2023-06-02 LAB — PSA: Prostate Specific Ag, Serum: 0.3 ng/mL (ref 0.0–4.0)

## 2023-06-02 NOTE — Progress Notes (Signed)
Your lab work is within acceptable range and there are no concerning findings.   ?

## 2023-06-14 ENCOUNTER — Other Ambulatory Visit (HOSPITAL_BASED_OUTPATIENT_CLINIC_OR_DEPARTMENT_OTHER): Payer: Self-pay

## 2023-06-15 ENCOUNTER — Telehealth: Payer: Self-pay

## 2023-06-15 NOTE — Telephone Encounter (Signed)
Copied from CRM [id number]. Topic: Clinical - Medical Advice >> Jun 14, 2023  8:19 AM Herbert Seta B wrote: Reason for CRM: Patient seen on 12/3 for persistent cough, PCP advised patient to call back if worsening.

## 2023-06-15 NOTE — Telephone Encounter (Signed)
Attempted to contact the patient regarding their call to the clinic. No answer. Left a brief vm msg to return a call back to the office. Direct call back info provided.

## 2023-06-16 ENCOUNTER — Telehealth: Payer: Self-pay | Admitting: Family Medicine

## 2023-06-16 NOTE — Telephone Encounter (Signed)
Not our patient. Never been seen at this office

## 2023-06-16 NOTE — Telephone Encounter (Signed)
Attempted to contact the patient regarding their call to the clinic. No answer. Left a brief vm msg to return a call back to the office. Direct call back info provided.

## 2023-06-16 NOTE — Telephone Encounter (Signed)
Copied from CRM [id number]. Topic: General - Other >> Jun 16, 2023  8:04 AM Carlatta H wrote: Reason for CRM: Patient received a call for the doctors office. They need to call back @ [phone]. They are call the clients wife's phone.

## 2023-06-28 NOTE — Telephone Encounter (Signed)
Attempted to contact the patient. No answer. Call went directly to vm. Left a brief vm msg for patient to return a call back to the clinic. Direct call back info provided.

## 2023-07-12 ENCOUNTER — Other Ambulatory Visit: Payer: Self-pay | Admitting: Family Medicine

## 2023-07-12 DIAGNOSIS — F32A Depression, unspecified: Secondary | ICD-10-CM

## 2023-07-13 ENCOUNTER — Other Ambulatory Visit (HOSPITAL_BASED_OUTPATIENT_CLINIC_OR_DEPARTMENT_OTHER): Payer: Self-pay

## 2023-07-13 ENCOUNTER — Other Ambulatory Visit: Payer: Self-pay

## 2023-07-14 ENCOUNTER — Other Ambulatory Visit (HOSPITAL_BASED_OUTPATIENT_CLINIC_OR_DEPARTMENT_OTHER): Payer: Self-pay

## 2023-07-15 ENCOUNTER — Other Ambulatory Visit (HOSPITAL_BASED_OUTPATIENT_CLINIC_OR_DEPARTMENT_OTHER): Payer: Self-pay

## 2023-07-15 MED ORDER — BUSPIRONE HCL 15 MG PO TABS
15.0000 mg | ORAL_TABLET | Freq: Two times a day (BID) | ORAL | 1 refills | Status: DC
  Filled 2023-07-15: qty 180, 90d supply, fill #0
  Filled 2023-10-17: qty 180, 90d supply, fill #1

## 2023-08-16 ENCOUNTER — Other Ambulatory Visit: Payer: Self-pay | Admitting: Family Medicine

## 2023-08-16 ENCOUNTER — Other Ambulatory Visit (HOSPITAL_BASED_OUTPATIENT_CLINIC_OR_DEPARTMENT_OTHER): Payer: Self-pay

## 2023-08-17 ENCOUNTER — Other Ambulatory Visit: Payer: Self-pay

## 2023-08-19 ENCOUNTER — Other Ambulatory Visit: Payer: Self-pay | Admitting: Family Medicine

## 2023-08-19 DIAGNOSIS — I10 Essential (primary) hypertension: Secondary | ICD-10-CM

## 2023-08-20 ENCOUNTER — Other Ambulatory Visit (HOSPITAL_BASED_OUTPATIENT_CLINIC_OR_DEPARTMENT_OTHER): Payer: Self-pay

## 2023-08-20 MED ORDER — LOSARTAN POTASSIUM 50 MG PO TABS
50.0000 mg | ORAL_TABLET | Freq: Every day | ORAL | 1 refills | Status: DC
  Filled 2023-08-20: qty 90, 90d supply, fill #0
  Filled 2023-11-16: qty 90, 90d supply, fill #1

## 2023-08-20 MED ORDER — METOPROLOL TARTRATE 25 MG PO TABS
12.5000 mg | ORAL_TABLET | Freq: Two times a day (BID) | ORAL | 3 refills | Status: AC
  Filled 2023-08-20: qty 90, 90d supply, fill #0
  Filled 2023-11-16: qty 90, 90d supply, fill #1
  Filled 2024-03-01: qty 90, 90d supply, fill #2
  Filled 2024-06-06: qty 90, 90d supply, fill #3

## 2023-08-21 ENCOUNTER — Other Ambulatory Visit (HOSPITAL_BASED_OUTPATIENT_CLINIC_OR_DEPARTMENT_OTHER): Payer: Self-pay

## 2023-08-31 ENCOUNTER — Other Ambulatory Visit (HOSPITAL_BASED_OUTPATIENT_CLINIC_OR_DEPARTMENT_OTHER): Payer: Self-pay

## 2023-09-02 ENCOUNTER — Encounter: Payer: Self-pay | Admitting: Family Medicine

## 2023-09-02 ENCOUNTER — Ambulatory Visit: Payer: Self-pay

## 2023-09-02 ENCOUNTER — Other Ambulatory Visit (HOSPITAL_COMMUNITY)
Admission: RE | Admit: 2023-09-02 | Discharge: 2023-09-02 | Disposition: A | Discharge status: Home / Self Care | Payer: Self-pay | Source: Ambulatory Visit | Attending: Family Medicine | Admitting: Family Medicine

## 2023-09-02 ENCOUNTER — Ambulatory Visit: Payer: Self-pay | Admitting: Family Medicine

## 2023-09-02 VITALS — BP 129/89 | HR 70 | Ht 69.0 in | Wt 305.0 lb

## 2023-09-02 DIAGNOSIS — L989 Disorder of the skin and subcutaneous tissue, unspecified: Secondary | ICD-10-CM | POA: Insufficient documentation

## 2023-09-02 DIAGNOSIS — E118 Type 2 diabetes mellitus with unspecified complications: Secondary | ICD-10-CM | POA: Diagnosis not present

## 2023-09-02 DIAGNOSIS — R053 Chronic cough: Secondary | ICD-10-CM

## 2023-09-02 DIAGNOSIS — E88819 Insulin resistance, unspecified: Secondary | ICD-10-CM

## 2023-09-02 DIAGNOSIS — I10 Essential (primary) hypertension: Secondary | ICD-10-CM

## 2023-09-02 DIAGNOSIS — F322 Major depressive disorder, single episode, severe without psychotic features: Secondary | ICD-10-CM

## 2023-09-02 LAB — POCT GLYCOSYLATED HEMOGLOBIN (HGB A1C): Hemoglobin A1C: 6.6 % — AB (ref 4.0–5.6)

## 2023-09-02 NOTE — Patient Instructions (Signed)
 We will let you know when biopsy is back. Check your MyChart

## 2023-09-02 NOTE — Assessment & Plan Note (Addendum)
 Previous A1c was 6.4 in the prediabetes range which is where he has been this time it did go up to 6.6.  Just really encouraged him to get back on track with diet and exercise.  Continue with metformin.  Follow-up in about 6 months.  Continue daily statin and ARB.

## 2023-09-02 NOTE — Progress Notes (Signed)
 Established Patient Office Visit  Subjective  Patient ID: Jackson Sherman, male    DOB: 24-Jul-1960  Age: 63 y.o. MRN: [id number]  Chief Complaint  Patient presents with   Hypertension   Diabetes    HPI  He is doing well overall but has a couple of concerns in addition to following up on his blood pressure and diabetes.  He has had a cough with occasional sputum production for the last 3 months that started after a cold in early December.  He denies any shortness of breath he is getting some chest soreness with the cough he denies any postnasal drip or GERD he feels like the cough is coming deep from his chest.    No prior hx skin cancer.       ROS    Objective:     BP 129/89   Pulse 70   Ht 5\' 9"  (1.753 m)   Wt (!) 305 lb (138.3 kg)   SpO2 96%   BMI 45.04 kg/m     Physical Exam Vitals and nursing note reviewed.  Constitutional:      Appearance: Normal appearance.  HENT:     Head: Normocephalic and atraumatic.  Eyes:     Conjunctiva/sclera: Conjunctivae normal.  Cardiovascular:     Rate and Rhythm: Normal rate and regular rhythm.  Pulmonary:     Effort: Pulmonary effort is normal.     Breath sounds: Normal breath sounds.  Skin:    General: Skin is warm and dry.     Comments: Center of his mid upper chest he has a hyperkeratotic lesion that is raised approximately 4 to 5 mm off of the skin with an irregular and rough texture.  Approximately 4-5 mm in width.  Neurological:     Mental Status: He is alert.  Psychiatric:        Mood and Affect: Mood normal.      Results for orders placed or performed in visit on 09/02/23  POCT HgB A1C  Result Value Ref Range   Hemoglobin A1C 6.6 (A) 4.0 - 5.6 %   HbA1c POC (<> result, manual entry)     HbA1c, POC (prediabetic range)     HbA1c, POC (controlled diabetic range)         The 10-year ASCVD risk score (Arnett DK, et al., 2019) is: 20.3%    Assessment & Plan:   Problem List Items Addressed This Visit        Cardiovascular and Mediastinum   Essential hypertension - Primary   Blood pressure well-controlled.  Continue current regimen.        Endocrine   Controlled diabetes mellitus type 2 with complications (HCC)   Previous A1c was 6.4 in the prediabetes range which is where he has been this time it did go up to 6.6.  Just really encouraged him to get back on track with diet and exercise.  Continue with metformin.  Follow-up in about 6 months.  Continue daily statin and ARB.        Other   Current severe episode of major depressive disorder without psychotic features without prior episode (HCC)   Struggling a little bit with some mild anxiety and depressive symptoms but happy with his current regimen he feels like it is effective and does not want make any changes today.  Continue with Lexapro 20 mg and buspirone 15 mg twice a day.      Other Visit Diagnoses       Chronic  cough       Relevant Orders   DG Chest 2 View     Skin lesion of chest wall       Relevant Orders   Surgical pathology        Shave Biopsy Procedure Note  Pre-operative Diagnosis: Suspicious lesion  Post-operative Diagnosis: same  Locations:middle  upper chest wall  Indications: Hyperkeratotic appearance consistent with possible squamous cell.  Anesthesia: Lidocaine 1% with epinephrine without added sodium bicarbonate  Procedure Details  History of allergy to iodine: no  Patient informed of the risks (including bleeding and infection) and benefits of the  procedure and Verbal informed consent obtained.  The lesion and surrounding area were given a sterile prep using chlorhexidine and draped in the usual sterile fashion. A scalpel was used to shave an area of skin approximately 1cm by 1cm.  Hemostasis achieved with alumuninum chloride. Antibiotic ointment and a sterile dressing applied.  The specimen was sent for pathologic examination. The patient tolerated the procedure well.  EBL: trace  blood  Findings: Await pathology  Condition: Stable  Complications: none.  Plan: 1. Instructed to keep the wound dry and covered for 24-48h and clean thereafter. 2. Warning signs of infection were reviewed.   3. Recommended that the patient use OTC acetaminophen as needed for pain.  4. Return in  PRN   Return in about 6 months (around 03/04/2024) for Diabetes follow-up, Pre-diabetes.    Nani Gasser, MD

## 2023-09-02 NOTE — Assessment & Plan Note (Signed)
 Struggling a little bit with some mild anxiety and depressive symptoms but happy with his current regimen he feels like it is effective and does not want make any changes today.  Continue with Lexapro 20 mg and buspirone 15 mg twice a day.

## 2023-09-02 NOTE — Assessment & Plan Note (Signed)
Blood pressure well controlled. Continue current regimen.  

## 2023-09-07 ENCOUNTER — Telehealth: Payer: Self-pay

## 2023-09-07 ENCOUNTER — Other Ambulatory Visit: Payer: Self-pay | Admitting: Physician Assistant

## 2023-09-07 ENCOUNTER — Encounter: Payer: Self-pay | Admitting: Physician Assistant

## 2023-09-07 DIAGNOSIS — C44529 Squamous cell carcinoma of skin of other part of trunk: Secondary | ICD-10-CM

## 2023-09-07 NOTE — Telephone Encounter (Signed)
 Routing to covering provider.   Call transferred from [facility] agent. 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

## 2023-09-07 NOTE — Progress Notes (Signed)
 Jackson Sherman,   Skin lesion does show squamous cell carcinoma. Will refer to dermatology for consult and see if they would like to resect any more tissue.

## 2023-09-08 LAB — SURGICAL PATHOLOGY

## 2023-09-14 NOTE — Telephone Encounter (Signed)
 Yes please call imaging

## 2023-09-15 ENCOUNTER — Encounter: Payer: Self-pay | Admitting: Family Medicine

## 2023-09-15 NOTE — Progress Notes (Signed)
 Hi Jackson Sherman, we finally had to call imaging and get them to read the film they are just so far behind.  Great news!  They did not see any nodules or lesions or sign of pneumonia.  Feel like your cough is gradually getting a little bit better?  You probably just have some post inflammation and it may take another few weeks for this to really settle down.

## 2023-09-15 NOTE — Telephone Encounter (Signed)
 Called radiology reading room and changed to STAT read=kph

## 2023-09-17 ENCOUNTER — Other Ambulatory Visit (HOSPITAL_BASED_OUTPATIENT_CLINIC_OR_DEPARTMENT_OTHER): Payer: Self-pay

## 2023-10-18 ENCOUNTER — Other Ambulatory Visit (HOSPITAL_BASED_OUTPATIENT_CLINIC_OR_DEPARTMENT_OTHER): Payer: Self-pay

## 2023-11-10 ENCOUNTER — Encounter: Payer: Self-pay | Admitting: Dermatology

## 2023-11-10 ENCOUNTER — Ambulatory Visit (INDEPENDENT_AMBULATORY_CARE_PROVIDER_SITE_OTHER): Payer: Self-pay | Admitting: Dermatology

## 2023-11-10 VITALS — BP 125/67 | HR 58

## 2023-11-10 DIAGNOSIS — C44529 Squamous cell carcinoma of skin of other part of trunk: Secondary | ICD-10-CM

## 2023-11-10 NOTE — Progress Notes (Signed)
   New Patient Visit   Subjective  Jackson Sherman is a 63 y.o. male who presents for the following: Biopsy proven SCC of chest - biopsied by Jackson Crumb, PA-C  The following portions of the chart were reviewed this encounter and updated as appropriate: medications, allergies, medical history  Review of Systems:  No other skin or systemic complaints except as noted in HPI or Assessment and Plan.  Objective  Well appearing patient in no apparent distress; mood and affect are within normal limits.  A focused examination was performed of the following areas: Chest  Relevant exam findings are noted in the Assessment and Plan.   Chest - Medial [facility]) Biopsy proven. Well healed scar.   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 We discussed the procedure in detail, including the removal of thin layers of tissue for examination under a microscope to ensure complete excision of the cancer while preserving as much healthy tissue as possible. The patient was informed that the surgery may take several hours, with potential additional stages if further tissue removal is necessary to achieve clear margins. We reviewed the risks, which include infection, scarring, and the possibility of requiring reconstructive techniques depending on the location and size of the defect. Reconstruction will occur after the tumor is fully cleared, and depending on the wound size and location, this may involve a flap, graft, linear closure, or sometimes healing by secondary intention. The patient was advised on preoperative and postoperative care, including avoiding certain medications, managing pain, and caring for the surgical site to prevent complications. They were given an opportunity to ask questions, and all concerns were addressed. The patient  expressed understanding and agreed to proceed with the scheduled Mohs surgery.  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   Return for Mohs for [name] on chest and also needs FBSC with Jackson Sherman .  I, Jackson Sherman, CMA, am acting as scribe for Jackson Finlay, MD .   Documentation: I have reviewed the above documentation for accuracy and completeness, and I agree with the above.  Jackson Finlay, MD

## 2023-11-16 ENCOUNTER — Other Ambulatory Visit: Payer: Self-pay | Admitting: Family Medicine

## 2023-11-16 ENCOUNTER — Other Ambulatory Visit (HOSPITAL_BASED_OUTPATIENT_CLINIC_OR_DEPARTMENT_OTHER): Payer: Self-pay

## 2023-11-16 DIAGNOSIS — E88819 Insulin resistance, unspecified: Secondary | ICD-10-CM

## 2023-11-16 MED ORDER — METFORMIN HCL ER 500 MG PO TB24
500.0000 mg | ORAL_TABLET | Freq: Every day | ORAL | 1 refills | Status: DC
  Filled 2023-11-16: qty 90, 90d supply, fill #0
  Filled 2024-02-11: qty 90, 90d supply, fill #1

## 2023-11-25 ENCOUNTER — Encounter: Payer: Self-pay | Admitting: Dermatology

## 2023-11-25 ENCOUNTER — Other Ambulatory Visit (HOSPITAL_BASED_OUTPATIENT_CLINIC_OR_DEPARTMENT_OTHER): Payer: Self-pay

## 2023-11-25 ENCOUNTER — Other Ambulatory Visit (HOSPITAL_COMMUNITY): Payer: Self-pay

## 2023-11-25 ENCOUNTER — Ambulatory Visit (INDEPENDENT_AMBULATORY_CARE_PROVIDER_SITE_OTHER): Payer: Self-pay | Admitting: Dermatology

## 2023-11-25 VITALS — BP 122/71 | HR 82 | Temp 98.0°F

## 2023-11-25 DIAGNOSIS — C4492 Squamous cell carcinoma of skin, unspecified: Secondary | ICD-10-CM

## 2023-11-25 DIAGNOSIS — L814 Other melanin hyperpigmentation: Secondary | ICD-10-CM

## 2023-11-25 DIAGNOSIS — C44529 Squamous cell carcinoma of skin of other part of trunk: Secondary | ICD-10-CM

## 2023-11-25 DIAGNOSIS — L579 Skin changes due to chronic exposure to nonionizing radiation, unspecified: Secondary | ICD-10-CM

## 2023-11-25 MED ORDER — OXYCODONE HCL 5 MG PO TABS
5.0000 mg | ORAL_TABLET | Freq: Four times a day (QID) | ORAL | 0 refills | Status: AC | PRN
Start: 2023-11-25 — End: ?
  Filled 2023-11-25 (×2): qty 8, 2d supply, fill #0

## 2023-11-25 NOTE — Progress Notes (Signed)
 Follow-Up Visit   Subjective  Jackson Sherman is a 63 y.o. 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 Greer Leak.   The following portions of the chart were reviewed this encounter and updated as appropriate: medications, allergies, medical history  Review of Systems:  No other skin or systemic complaints except as noted in HPI or Assessment and Plan.  Objective  Well appearing patient in no apparent distress; mood and affect are within normal limits.  A focused examination was performed of the following areas: Chest wall Relevant physical exam findings are noted in the Assessment and Plan.   chest wall Scar    Assessment & Plan   SQUAMOUS CELL CARCINOMA OF SKIN chest wall Mohs surgery  Consent obtained: written  Anticoagulation: Was the anticoagulation regimen changed prior to Mohs? No    Anesthesia: Anesthesia method: local infiltration Local anesthetic: lidocaine  1% WITH epi  Procedure Details: Timeout: pre-procedure verification complete Procedure Prep: patient was prepped and draped in usual sterile fashion Prep type: chlorhexidine  Pre-Op diagnosis: squamous cell carcinoma SCC subtype: well differentiated MohsAIQ Surgical site (if tumor spans multiple areas, please select predominant area): trunk (excluding nipple/areola) Surgery side: midline Surgical site (from skin exam): chest wall Pre-operative length (cm): 1.6 Pre-operative width (cm): 1.5 Indications for Mohs surgery: anatomic location where tissue conservation is critical and ill-defined borders  Micrographic Surgery Details: Post-operative length (cm): 3.9 Post-operative width (cm): 2.9 Number of Mohs stages: 1  Skin repair Complexity:  Complex Final length (cm):  8 Subcutaneous layers (deep stitches):  Suture size:  3-0 (with dermabond and steri strips) Suture type: Monocryl (poliglecaprone 25)    Related Medications oxyCODONE  (OXY IR/ROXICODONE ) 5 MG immediate release tablet Take 1 tablet (5 mg total) by mouth every 6 (six) hours as needed for up to 8 doses.   Return in about 4 weeks (around 12/23/2023) for wound check.  Flora Humphreys, CMA, am acting as scribe for Deneise Finlay, MD.    11/25/2023  HISTORY OF PRESENT ILLNESS  Jackson Sherman is seen in consultation at the request of Dr. Greer Leak for biopsy-proven Invasive Well Differentiated Squamous Cell Carcinoma of the mid chest. They note that the area has been present for about 6 months increasing in size with time.  There is no history of previous treatment.  Reports no other new or changing lesions and has no other complaints today.  Medications and allergies: see patient chart.  Review of systems: Reviewed 8 systems and notable for the above skin cancer.  All other systems reviewed are unremarkable/negative, unless noted in the HPI. Past medical history, surgical history, family history, social history were also reviewed and are noted in the chart/questionnaire.    PHYSICAL EXAMINATION  General: Well-appearing, in no acute distress, alert and oriented x 4. Vitals reviewed in chart (if available).   Skin: Exam reveals a 1.6 x 1.5 cm erythematous papule and biopsy scar on the mid chest. There are rhytids, telangiectasias, and lentigines, consistent with photodamage.   Biopsy report(s) reviewed, confirming the diagnosis.   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 Due to location, size, histology, or recurrence and the likelihood of subclinical extension as well as the need to conserve normal surrounding tissue, the patient was deemed acceptable for Mohs micrographic surgery (MMS).  The nature and purpose of the  procedure, associated benefits and risks including recurrence and scarring, possible complications such as pain,  infection, and bleeding, and alternative methods of treatment if appropriate were discussed with the patient during consent. The lesion location was verified by the patient, by reviewing previous notes, pathology reports, and by photographs as well as angulation measurements if available.  Informed consent was reviewed and signed by the patient, and timeout was performed at 8:30 AM. See op note below.  2. For the photodamage and solar lentigines, sun protection discussed/information given on OTC sunscreens, and we recommend continued regular follow-up with primary dermatologist every 6 months or sooner for any growing, bleeding, or changing lesions. 3. Prognosis and future surveillance discussed. 4. Letter with treatment outcome sent to referring provider. 5. Pain acetaminophen /ibuprofen /oxycodone  5 mg  MOHS MICROGRAPHIC SURGERY AND RECONSTRUCTION  Initial size:   1.6 x 1.5 cm Surgical defect/wound size: 3.9 x 2.9 cm Anesthesia:    0.33% lidocaine  with 1:200,000 epinephrine  EBL:    <5 mL Complications:  None Repair type:   Complex SQ suture:   3-0 Monocryl Cutaneous suture:  Dermabond and Steri Strips Final size of the repair: 8.0 cm  Stages: 1  STAGE I: Anesthesia achieved with 0.5% lidocaine  with 1:200,000 epinephrine . ChloraPrep applied. 4 section(s) excised using Mohs technique (this includes total peripheral and deep tissue margin excision and evaluation with frozen sections, excised and interpreted by the same physician). The tumor was first debulked and then excised with an approx. 2mm margin.  Hemostasis was achieved with electrocautery as needed.  The specimen was then oriented, subdivided/relaxed, inked, and processed using Mohs technique.    Frozen section analysis revealed a clear deep and peripheral margin.  Reconstruction  The surgical wound was then cleaned, prepped, and re-anesthetized as above. Wound edges were undermined extensively along at least one entire edge and at a  distance equal to or greater than the width of the defect (see wound defect size above) in order to achieve closure and decrease wound tension and anatomic distortion. Redundant tissue repair including standing cone removal was performed. Hemostasis was achieved with electrocautery. Subcutaneous and epidermal tissues were approximated with the above sutures. The surgical site was then lightly scrubbed with sterile, saline-soaked gauze. Steri-strips were applied, and the area was then bandaged using Vaseline ointment, non-adherent gauze, gauze pads, and tape to provide an adequate pressure dressing. The patient tolerated the procedure well, was given detailed written and verbal wound care instructions, and was discharged in good condition.   The patient will follow-up: 4 weeks.   Documentation: I have reviewed the above documentation for accuracy and completeness, and I agree with the above.  Deneise Finlay, MD

## 2023-11-25 NOTE — Patient Instructions (Signed)

## 2023-12-01 ENCOUNTER — Encounter: Payer: Self-pay | Admitting: Dermatology

## 2023-12-19 ENCOUNTER — Other Ambulatory Visit: Payer: Self-pay | Admitting: Family Medicine

## 2023-12-20 ENCOUNTER — Other Ambulatory Visit (HOSPITAL_BASED_OUTPATIENT_CLINIC_OR_DEPARTMENT_OTHER): Payer: Self-pay

## 2023-12-20 MED ORDER — ROSUVASTATIN CALCIUM 10 MG PO TABS
10.0000 mg | ORAL_TABLET | Freq: Every day | ORAL | 3 refills | Status: AC
  Filled 2023-12-20: qty 90, 90d supply, fill #0
  Filled 2024-03-15: qty 90, 90d supply, fill #1
  Filled 2024-06-15: qty 90, 90d supply, fill #2

## 2023-12-27 ENCOUNTER — Ambulatory Visit: Payer: Self-pay | Admitting: Dermatology

## 2024-01-28 ENCOUNTER — Other Ambulatory Visit (HOSPITAL_BASED_OUTPATIENT_CLINIC_OR_DEPARTMENT_OTHER): Payer: Self-pay

## 2024-02-11 ENCOUNTER — Other Ambulatory Visit: Payer: Self-pay | Admitting: Family Medicine

## 2024-02-11 ENCOUNTER — Other Ambulatory Visit: Payer: Self-pay

## 2024-02-11 ENCOUNTER — Other Ambulatory Visit (HOSPITAL_BASED_OUTPATIENT_CLINIC_OR_DEPARTMENT_OTHER): Payer: Self-pay

## 2024-02-11 DIAGNOSIS — F32A Depression, unspecified: Secondary | ICD-10-CM

## 2024-02-11 MED ORDER — BUSPIRONE HCL 15 MG PO TABS
15.0000 mg | ORAL_TABLET | Freq: Two times a day (BID) | ORAL | 1 refills | Status: AC
  Filled 2024-02-11: qty 180, 90d supply, fill #0
  Filled 2024-05-19: qty 180, 90d supply, fill #1

## 2024-02-12 ENCOUNTER — Other Ambulatory Visit: Payer: Self-pay | Admitting: Family Medicine

## 2024-02-14 ENCOUNTER — Other Ambulatory Visit (HOSPITAL_BASED_OUTPATIENT_CLINIC_OR_DEPARTMENT_OTHER): Payer: Self-pay

## 2024-02-14 MED ORDER — LOSARTAN POTASSIUM 50 MG PO TABS
50.0000 mg | ORAL_TABLET | Freq: Every day | ORAL | 1 refills | Status: AC
  Filled 2024-02-14: qty 90, 90d supply, fill #0
  Filled 2024-05-19: qty 90, 90d supply, fill #1

## 2024-03-02 ENCOUNTER — Ambulatory Visit (INDEPENDENT_AMBULATORY_CARE_PROVIDER_SITE_OTHER): Payer: Self-pay | Admitting: Family Medicine

## 2024-03-02 ENCOUNTER — Encounter: Payer: Self-pay | Admitting: Family Medicine

## 2024-03-02 ENCOUNTER — Other Ambulatory Visit (HOSPITAL_BASED_OUTPATIENT_CLINIC_OR_DEPARTMENT_OTHER): Payer: Self-pay

## 2024-03-02 VITALS — BP 124/68 | HR 57 | Ht 69.0 in | Wt 312.4 lb

## 2024-03-02 DIAGNOSIS — F329 Major depressive disorder, single episode, unspecified: Secondary | ICD-10-CM

## 2024-03-02 DIAGNOSIS — I10 Essential (primary) hypertension: Secondary | ICD-10-CM

## 2024-03-02 DIAGNOSIS — Z23 Encounter for immunization: Secondary | ICD-10-CM

## 2024-03-02 DIAGNOSIS — Z6841 Body Mass Index (BMI) 40.0 and over, adult: Secondary | ICD-10-CM

## 2024-03-02 DIAGNOSIS — F4321 Adjustment disorder with depressed mood: Secondary | ICD-10-CM

## 2024-03-02 DIAGNOSIS — E66813 Obesity, class 3: Secondary | ICD-10-CM

## 2024-03-02 DIAGNOSIS — E118 Type 2 diabetes mellitus with unspecified complications: Secondary | ICD-10-CM

## 2024-03-02 LAB — POCT GLYCOSYLATED HEMOGLOBIN (HGB A1C): Hemoglobin A1C: 7.2 % — AB (ref 4.0–5.6)

## 2024-03-02 MED ORDER — TIRZEPATIDE 5 MG/0.5ML ~~LOC~~ SOAJ: 5.0000 mg | SUBCUTANEOUS | 0 refills | Status: DC

## 2024-03-02 MED ORDER — TIRZEPATIDE 2.5 MG/0.5ML ~~LOC~~ SOAJ: 2.5000 mg | SUBCUTANEOUS | 0 refills | Status: DC

## 2024-03-02 NOTE — Assessment & Plan Note (Signed)
-  Continue Lexapro

## 2024-03-02 NOTE — Assessment & Plan Note (Signed)
 BP normall well controlled but high on first check. Repeat was better.

## 2024-03-02 NOTE — Assessment & Plan Note (Addendum)
 He is interested in an GLP 1 for better DM control and weight loss. He understand he will need to work on his diet and do some strength training.

## 2024-03-02 NOTE — Progress Notes (Signed)
 Established Patient Office Visit  Subjective  Patient ID: Jackson Sherman, male    DOB: 1960/10/30  Age: 63 y.o. MRN: [id number]  Chief Complaint  Patient presents with   Hypertension   Diabetes    HPI  Discussed the use of AI scribe software for clinical note transcription with the patient, who gave verbal consent to proceed.  History of Present Illness Jackson Sherman is a 63 year old male with diabetes who presents for diabetes management and weight loss consultation.  Glycemic control - Diabetes mellitus with recent hemoglobin A1c of 7.2% - Desires to lower A1c below 7% - Difficulty managing blood glucose levels - No current symptoms of hypoglycemia or hyperglycemia reported  Weight management - Desires weight loss to improve glycemic control - Lacks motivation to engage in physical activity - Not currently taking any weight loss medications - Considering GLP-1 receptor agonist therapy for appetite control and weight loss - Aware of potential side effects of GLP-1 medications, including heartburn and constipation - Plans to consider GLP-1 therapy after obtaining Medicare coverage in May  Diabetes-related preventive care - Has not yet completed annual diabetic eye exam - Plans to schedule eye exam after upcoming trip to the beach  Immunization status - Received influenza vaccination - Awaiting availability of new COVID-19 vaccine and plans to receive it when available   Grief - doing ok since the passing of his mother. She lived with him for the last 2 months of her life. She was on hospice.       ROS    Objective:     BP (!) 146/88   Pulse (!) 57   Ht 5' 9 (1.753 m)   Wt (!) 312 lb 6.4 oz (141.7 kg)   SpO2 95%   BMI 46.13 kg/m     Physical Exam Vitals and nursing note reviewed.  Constitutional:      Appearance: Normal appearance.  HENT:     Head: Normocephalic and atraumatic.  Eyes:     Conjunctiva/sclera: Conjunctivae normal.   Cardiovascular:     Rate and Rhythm: Normal rate and regular rhythm.  Pulmonary:     Effort: Pulmonary effort is normal.     Breath sounds: Normal breath sounds.  Skin:    General: Skin is warm and dry.  Neurological:     Mental Status: He is alert.  Psychiatric:        Mood and Affect: Mood normal.      Results for orders placed or performed in visit on 03/02/24  POCT HgB A1C  Result Value Ref Range   Hemoglobin A1C 7.2 (A) 4.0 - 5.6 %   HbA1c POC (<> result, manual entry)     HbA1c, POC (prediabetic range)     HbA1c, POC (controlled diabetic range)         The 10-year ASCVD risk score (Arnett DK, et al., 2019) is: 26.7%    Assessment & Plan:   Problem List Items Addressed This Visit       Cardiovascular and Mediastinum   Essential hypertension   BP normall well controlled but high on first check. Repeat was better.       Relevant Orders   CMP14+EGFR   Urine Microalbumin w/creat. ratio     Endocrine   Controlled diabetes mellitus type 2 with complications (HCC) - Primary   Relevant Medications   tirzepatide  (MOUNJARO ) 2.5 MG/0.5ML Pen   tirzepatide  (MOUNJARO ) 5 MG/0.5ML Pen (Start on 03/30/2024)   Other Relevant  Orders   POCT HgB A1C (Completed)   CMP14+EGFR   Urine Microalbumin w/creat. ratio     Other   Unipolar depression   Continue Lexapro .        Class 3 severe obesity with body mass index (BMI) of 45.0 to 49.9 in adult   He is interested in an GLP 1 for better DM control and weight loss. He understand he will need to work on his diet and do some strength training.        Relevant Medications   tirzepatide  (MOUNJARO ) 2.5 MG/0.5ML Pen   tirzepatide  (MOUNJARO ) 5 MG/0.5ML Pen (Start on 03/30/2024)   Other Visit Diagnoses       Encounter for immunization       Relevant Orders   Flu vaccine trivalent PF, 6mos and older(Flulaval,Afluria,Fluarix,Fluzone) (Completed)     Grief          Assessment and Plan Assessment & Plan Type 2 diabetes  mellitus with unspecified complications Type 2 diabetes mellitus with an A1c of 7.2, indicating suboptimal control. - Discussed GLP-1 receptor agonists for diabetes management and weight loss, including mechanism and side effects such as heartburn, GERD, and constipation. - Encouraged dietary changes and portion control. - Recommended strength training exercises. - Insurance coverage for GLP-1s expected in May with Medicare enrollment. Consider early initiation with coupon cards. - Order urine test for protein. - Check if blood work is due.  Grief - consider therapy with hospice if needed. He is really doing OK.    Return in about 3 months (around 06/01/2024) for Diabetes follow-up.    Dorothyann Byars, MD

## 2024-03-03 ENCOUNTER — Encounter: Payer: Self-pay | Admitting: Family Medicine

## 2024-03-03 ENCOUNTER — Ambulatory Visit: Payer: Self-pay | Admitting: Family Medicine

## 2024-03-03 LAB — CMP14+EGFR
ALT: 20 IU/L (ref 0–44)
AST: 17 IU/L (ref 0–40)
Albumin: 4 g/dL (ref 3.9–4.9)
Alkaline Phosphatase: 95 IU/L (ref 44–121)
BUN/Creatinine Ratio: 18 (ref 10–24)
BUN: 22 mg/dL (ref 8–27)
Bilirubin Total: 0.3 mg/dL (ref 0.0–1.2)
CO2: 22 mmol/L (ref 20–29)
Calcium: 9.1 mg/dL (ref 8.6–10.2)
Chloride: 97 mmol/L (ref 96–106)
Creatinine, Ser: 1.2 mg/dL (ref 0.76–1.27)
Globulin, Total: 3.5 g/dL (ref 1.5–4.5)
Glucose: 145 mg/dL — ABNORMAL HIGH (ref 70–99)
Potassium: 5.1 mmol/L (ref 3.5–5.2)
Sodium: 136 mmol/L (ref 134–144)
Total Protein: 7.5 g/dL (ref 6.0–8.5)
eGFR: 68 mL/min/1.73 (ref >59)

## 2024-03-03 LAB — MICROALBUMIN / CREATININE URINE RATIO
Creatinine, Urine: 102.3 mg/dL
Microalb/Creat Ratio: 4 mg/g{creat} (ref 0–29)
Microalbumin, Urine: 4.4 ug/mL

## 2024-03-03 NOTE — Progress Notes (Signed)
 Hi Phil, metabolic panel including liver overall looks good.  No excess protein in the urine.

## 2024-03-08 ENCOUNTER — Ambulatory Visit: Payer: Self-pay | Admitting: Physician Assistant

## 2024-03-09 ENCOUNTER — Other Ambulatory Visit: Payer: Self-pay

## 2024-03-09 DIAGNOSIS — E66813 Body mass index (BMI) 45.0-49.9, adult: Secondary | ICD-10-CM

## 2024-03-09 DIAGNOSIS — E118 Type 2 diabetes mellitus with unspecified complications: Secondary | ICD-10-CM

## 2024-03-09 NOTE — Telephone Encounter (Signed)
 Copied from CRM #[id number]. Topic: Clinical - Prescription Issue >> Mar 08, 2024  1:32 PM Kevelyn M wrote: Reason for CRM: Patient is stating that the pharmacy did not receive the tirzepatide  (MOUNJARO ) 5 MG/0.5ML Pen and tirzepatide  (MOUNJARO ) 2.5 MG/0.5ML Pen on 03/02/2024. Please re-submit. They would go to:   [facility] - [place], [place] - [address] [place] [place] [address] Phone: [phone]  Fax: [phone]

## 2024-03-15 ENCOUNTER — Other Ambulatory Visit (HOSPITAL_BASED_OUTPATIENT_CLINIC_OR_DEPARTMENT_OTHER): Payer: Self-pay

## 2024-03-15 MED ORDER — TIRZEPATIDE 5 MG/0.5ML ~~LOC~~ SOAJ: 5.0000 mg | SUBCUTANEOUS | 0 refills | Status: DC

## 2024-03-15 MED ORDER — TIRZEPATIDE 2.5 MG/0.5ML ~~LOC~~ SOAJ: 2.5000 mg | SUBCUTANEOUS | 0 refills | Status: DC

## 2024-03-15 NOTE — Telephone Encounter (Signed)
 Medication reordered. Called pharmacy to ensure receipt   Spoke w/Lorena and was advised that this is a non formulary medication. They supply Zepbound . If Dr. Alvan would like to resubmit for Zepbound  they will process this.   Will order, pend and  forward to pcp for review.

## 2024-03-15 NOTE — Telephone Encounter (Signed)
 I do not rind resending as Zepbound  but he is diabetic.  Did his insurance not cover this?  Or was the co-pay still too high or do we need to do a prior authorization I am just concerned that he is having to go through [facility] which is usually a cash price option.

## 2024-03-16 ENCOUNTER — Encounter: Payer: Self-pay | Admitting: Family Medicine

## 2024-03-16 MED ORDER — TIRZEPATIDE-WEIGHT MANAGEMENT 2.5 MG/0.5ML ~~LOC~~ SOLN: 2.5000 mg | SUBCUTANEOUS | 0 refills | Status: DC

## 2024-03-16 MED ORDER — TIRZEPATIDE-WEIGHT MANAGEMENT 5 MG/0.5ML ~~LOC~~ SOLN: 5.0000 mg | SUBCUTANEOUS | 1 refills | Status: DC

## 2024-03-16 NOTE — Telephone Encounter (Signed)
  [facility] - [place], [place] - [address] [place] [address] Phone: [phone]  Fax: [phone]

## 2024-03-16 NOTE — Telephone Encounter (Signed)
 Per [facility] pharmacy- prescription will have to be changed to Zepbound  to process through [facility]. Mounjaro  is non-formulary for them and cannot be sent through [facility] direct pharmacy

## 2024-03-16 NOTE — Telephone Encounter (Signed)
 Message in chart from DR. Metheney  in separate message  Alvan Dorothyann BIRCH, MD    03/15/24  5:34 PM Note I do not rind resending as Zepbound  but he is diabetic.  Did his insurance not cover this?  Or was the co-pay still too high or do we need to do a prior authorization I am just concerned that he is having to go through [facility] which is usually a cash price option.      Spoke with patient he does not have any insurance at all and this is why he is going through [facility]

## 2024-04-10 ENCOUNTER — Other Ambulatory Visit (HOSPITAL_BASED_OUTPATIENT_CLINIC_OR_DEPARTMENT_OTHER): Payer: Self-pay

## 2024-04-10 ENCOUNTER — Other Ambulatory Visit: Payer: Self-pay | Admitting: Family Medicine

## 2024-04-10 DIAGNOSIS — F32A Depression, unspecified: Secondary | ICD-10-CM

## 2024-04-10 MED ORDER — ESCITALOPRAM OXALATE 20 MG PO TABS
20.0000 mg | ORAL_TABLET | Freq: Every day | ORAL | 11 refills | Status: AC
  Filled 2024-04-10: qty 30, 30d supply, fill #0
  Filled 2024-05-08: qty 30, 30d supply, fill #1
  Filled 2024-06-06: qty 30, 30d supply, fill #2
  Filled 2024-07-05: qty 30, 30d supply, fill #3
  Filled 2024-08-04: qty 30, 30d supply, fill #4

## 2024-05-02 ENCOUNTER — Encounter: Payer: Self-pay | Admitting: Family Medicine

## 2024-05-02 DIAGNOSIS — E118 Type 2 diabetes mellitus with unspecified complications: Secondary | ICD-10-CM

## 2024-05-03 MED ORDER — ZEPBOUND 10 MG/0.5ML ~~LOC~~ SOLN: 10.0000 mg | SUBCUTANEOUS | 0 refills | Status: DC

## 2024-05-03 MED ORDER — ZEPBOUND 7.5 MG/0.5ML ~~LOC~~ SOLN: 7.5000 mg | SUBCUTANEOUS | 0 refills | Status: DC

## 2024-05-08 ENCOUNTER — Other Ambulatory Visit (HOSPITAL_BASED_OUTPATIENT_CLINIC_OR_DEPARTMENT_OTHER): Payer: Self-pay

## 2024-05-19 ENCOUNTER — Other Ambulatory Visit (HOSPITAL_BASED_OUTPATIENT_CLINIC_OR_DEPARTMENT_OTHER): Payer: Self-pay

## 2024-05-22 ENCOUNTER — Other Ambulatory Visit: Payer: Self-pay | Admitting: Family Medicine

## 2024-05-22 DIAGNOSIS — E88819 Insulin resistance, unspecified: Secondary | ICD-10-CM

## 2024-05-23 ENCOUNTER — Other Ambulatory Visit (HOSPITAL_BASED_OUTPATIENT_CLINIC_OR_DEPARTMENT_OTHER): Payer: Self-pay

## 2024-05-24 ENCOUNTER — Other Ambulatory Visit (HOSPITAL_BASED_OUTPATIENT_CLINIC_OR_DEPARTMENT_OTHER): Payer: Self-pay

## 2024-05-24 MED ORDER — METFORMIN HCL ER 500 MG PO TB24
500.0000 mg | ORAL_TABLET | Freq: Every day | ORAL | 1 refills | Status: DC
  Filled 2024-05-24: qty 90, 90d supply, fill #0

## 2024-05-26 ENCOUNTER — Other Ambulatory Visit (HOSPITAL_BASED_OUTPATIENT_CLINIC_OR_DEPARTMENT_OTHER): Payer: Self-pay

## 2024-06-01 ENCOUNTER — Ambulatory Visit: Payer: Self-pay | Admitting: Family Medicine

## 2024-06-01 ENCOUNTER — Encounter: Payer: Self-pay | Admitting: Family Medicine

## 2024-06-01 VITALS — BP 116/80 | HR 70 | Ht 69.0 in | Wt 280.2 lb

## 2024-06-01 DIAGNOSIS — E118 Type 2 diabetes mellitus with unspecified complications: Secondary | ICD-10-CM

## 2024-06-01 DIAGNOSIS — F322 Major depressive disorder, single episode, severe without psychotic features: Secondary | ICD-10-CM

## 2024-06-01 DIAGNOSIS — E1169 Type 2 diabetes mellitus with other specified complication: Secondary | ICD-10-CM

## 2024-06-01 DIAGNOSIS — I10 Essential (primary) hypertension: Secondary | ICD-10-CM

## 2024-06-01 DIAGNOSIS — E785 Hyperlipidemia, unspecified: Secondary | ICD-10-CM

## 2024-06-01 LAB — POCT GLYCOSYLATED HEMOGLOBIN (HGB A1C): HbA1c, POC (controlled diabetic range): 5.7 % (ref 0.0–7.0)

## 2024-06-01 MED ORDER — ZEPBOUND 12.5 MG/0.5ML ~~LOC~~ SOLN: 12.5000 mg | SUBCUTANEOUS | 0 refills | Status: AC

## 2024-06-01 NOTE — Assessment & Plan Note (Signed)
 Stable on lexapro  and Buspar .

## 2024-06-01 NOTE — Assessment & Plan Note (Signed)
 Type 2 diabetes mellitus, well controlled Diabetes well controlled with normal A1c. Weight loss and increased activity improved glycemic control. Zetbalm increased to 12.5 mg. Occasional nausea, possible hypoglycemia. Constipation potential with dosage increase. - Increased Zepbound  to 12.5 mg, 90-day supply. - Discontinued metformin . - Monitor for nausea and constipation. - Use Miralax  as needed for constipation. - Encouraged continued weight loss and physical activity.

## 2024-06-01 NOTE — Assessment & Plan Note (Addendum)
 Due for updated lipid panel but no insurance right now. Will plan for labs at f/u visit.   Lipid Panel     Component Value Date/Time   CHOL 170 11/30/2022 1011   CHOL 168 08/30/2018 0930   TRIG 94 11/30/2022 1011   HDL 52 11/30/2022 1011   HDL 42 08/30/2018 0930   CHOLHDL 3.3 11/30/2022 1011   VLDL 25 09/24/2015 1049   LDLCALC 99 11/30/2022 1011   LABVLDL 21 08/30/2018 0930

## 2024-06-01 NOTE — Patient Instructions (Signed)
[place] to stop metformin

## 2024-06-01 NOTE — Progress Notes (Signed)
 Established Patient Office Visit  Patient ID: Jackson Sherman, male    DOB: 09-20-1960  Age: 63 y.o. MRN: [id number] PCP: Alvan Dorothyann BIRCH, MD  Chief Complaint  Patient presents with   Diabetes    Patient states he does not currently have insurance and will schedule the ophthalmology exam  after his next birthday as he will then have medicare insurance.     Subjective:     HPI  Discussed the use of AI scribe software for clinical note transcription with the patient, who gave verbal consent to proceed.  History of Present Illness Jackson Sherman is a 63 year old male with diabetes who presents for follow-up of his diabetes management.  Glycemic control and lifestyle modification - A1c now within normal range, previously 7.2% - Weight loss of 30 pounds - Adherence to a healthier diet with smaller portions and increased protein intake (eggs with cottage cheese, fish)  Physical activity and functional status - Increased physical activity, currently walking three times a week - Able to walk up to 2.75 miles per session - Previously unable to walk short distances without breaks - Improved stamina and strength since September  Antihyperglycemic medication tolerance - on zepbound  10 mg daily - Minimal nausea, with occasional episodes possibly related to hypoglycemia when skipping meals - Manages hypoglycemic symptoms with protein-rich snacks (beef jerky, peanuts)  Gastrointestinal and respiratory symptoms - Reduction in nighttime coughing, attributed to weight loss and improved reflux symptoms - Prefers not to take metformin  without food - Has not eaten breakfast recently     ROS    Objective:     BP 116/80 (BP Location: Left Arm, Patient Position: Sitting, Cuff Size: Large)   Pulse 70   Ht 5' 9 (1.753 m)   Wt 280 lb 4 oz (127.1 kg)   SpO2 98%   BMI 41.39 kg/m    Physical Exam Vitals and nursing note reviewed.  Constitutional:      Appearance:  Normal appearance.  HENT:     Head: Normocephalic and atraumatic.  Eyes:     Conjunctiva/sclera: Conjunctivae normal.  Cardiovascular:     Rate and Rhythm: Normal rate and regular rhythm.  Pulmonary:     Effort: Pulmonary effort is normal.     Breath sounds: Normal breath sounds.  Skin:    General: Skin is warm and dry.  Neurological:     Mental Status: He is alert.  Psychiatric:        Mood and Affect: Mood normal.      Results for orders placed or performed in visit on 06/01/24  POCT HgB A1C  Result Value Ref Range   Hemoglobin A1C     HbA1c POC (<> result, manual entry)     HbA1c, POC (prediabetic range)     HbA1c, POC (controlled diabetic range) 5.7 0.0 - 7.0 %      The 10-year ASCVD risk score (Arnett DK, et al., 2019) is: 18.6%    Assessment & Plan:   Problem List Items Addressed This Visit       Cardiovascular and Mediastinum   Essential hypertension   Relevant Orders   POCT HgB A1C (Completed)     Endocrine   Type 2 diabetes mellitus with morbid obesity (HCC)   Type 2 diabetes mellitus, well controlled Diabetes well controlled with normal A1c. Weight loss and increased activity improved glycemic control. Zetbalm increased to 12.5 mg. Occasional nausea, possible hypoglycemia. Constipation potential with dosage increase. - Increased Zepbound   to 12.5 mg, 90-day supply. - Discontinued metformin . - Monitor for nausea and constipation. - Use Miralax  as needed for constipation. - Encouraged continued weight loss and physical activity.      Relevant Medications   Tirzepatide -Weight Management (ZEPBOUND ) 12.5 MG/0.5ML SOLN     Other   Hyperlipidemia   Due for updated lipid panel but no insurance right now. Will plan for labs at f/u visit.   Lipid Panel     Component Value Date/Time   CHOL 170 11/30/2022 1011   CHOL 168 08/30/2018 0930   TRIG 94 11/30/2022 1011   HDL 52 11/30/2022 1011   HDL 42 08/30/2018 0930   CHOLHDL 3.3 11/30/2022 1011   VLDL  25 09/24/2015 1049   LDLCALC 99 11/30/2022 1011   LABVLDL 21 08/30/2018 0930         Relevant Orders   POCT HgB A1C (Completed)   Current severe episode of major depressive disorder without psychotic features without prior episode (HCC)   Stable on lexapro  and Buspar .        Other Visit Diagnoses       Controlled type 2 diabetes mellitus with complication, without long-term current use of insulin  (HCC)    -  Primary   Relevant Medications   Tirzepatide -Weight Management (ZEPBOUND ) 12.5 MG/0.5ML SOLN   Other Relevant Orders   POCT HgB A1C (Completed)   Pfizer Comirnaty Covid -19 Vaccine 30yrs and older (Completed)       Assessment and Plan Assessment & Plan   General Health Maintenance Discussed COVID-19 vaccine importance for diabetes. No adverse reactions to previous vaccines. Considering updated vaccine. - Recommended COVID-19 vaccine, 2025-2026 version.    Return in about 5 months (around 10/30/2024) for Diabetes follow-up and labs  .    Dorothyann Byars, MD [facility] at [facility]

## 2024-06-06 ENCOUNTER — Other Ambulatory Visit (HOSPITAL_BASED_OUTPATIENT_CLINIC_OR_DEPARTMENT_OTHER): Payer: Self-pay

## 2024-06-15 ENCOUNTER — Other Ambulatory Visit (HOSPITAL_BASED_OUTPATIENT_CLINIC_OR_DEPARTMENT_OTHER): Payer: Self-pay

## 2024-07-05 ENCOUNTER — Other Ambulatory Visit (HOSPITAL_BASED_OUTPATIENT_CLINIC_OR_DEPARTMENT_OTHER): Payer: Self-pay

## 2024-07-10 ENCOUNTER — Other Ambulatory Visit (HOSPITAL_BASED_OUTPATIENT_CLINIC_OR_DEPARTMENT_OTHER): Payer: Self-pay

## 2024-08-04 ENCOUNTER — Other Ambulatory Visit (HOSPITAL_BASED_OUTPATIENT_CLINIC_OR_DEPARTMENT_OTHER): Payer: Self-pay

## 2024-11-08 ENCOUNTER — Ambulatory Visit: Payer: Self-pay | Admitting: Family Medicine
